# Patient Record
Sex: Female | Born: 1950
Health system: Southern US, Community
[De-identification: ages and names within clinical notes are randomized; demographics above are authoritative.]

## PROBLEM LIST (undated history)

## (undated) DIAGNOSIS — E785 Hyperlipidemia, unspecified: Secondary | ICD-10-CM

## (undated) DIAGNOSIS — I2510 Atherosclerotic heart disease of native coronary artery without angina pectoris: Secondary | ICD-10-CM

## (undated) DIAGNOSIS — M1990 Unspecified osteoarthritis, unspecified site: Secondary | ICD-10-CM

## (undated) DIAGNOSIS — K219 Gastro-esophageal reflux disease without esophagitis: Secondary | ICD-10-CM

## (undated) DIAGNOSIS — I491 Atrial premature depolarization: Secondary | ICD-10-CM

## (undated) DIAGNOSIS — R0600 Dyspnea, unspecified: Secondary | ICD-10-CM

## (undated) DIAGNOSIS — R7303 Prediabetes: Secondary | ICD-10-CM

## (undated) DIAGNOSIS — C801 Malignant (primary) neoplasm, unspecified: Secondary | ICD-10-CM

## (undated) DIAGNOSIS — Z9289 Personal history of other medical treatment: Secondary | ICD-10-CM

## (undated) DIAGNOSIS — E119 Type 2 diabetes mellitus without complications: Secondary | ICD-10-CM

## (undated) DIAGNOSIS — R079 Chest pain, unspecified: Secondary | ICD-10-CM

## (undated) DIAGNOSIS — I209 Angina pectoris, unspecified: Secondary | ICD-10-CM

## (undated) HISTORY — DX: Atrial premature depolarization: I49.1

## (undated) HISTORY — PX: BASAL CELL CARCINOMA EXCISION: SHX1214

## (undated) HISTORY — DX: Chest pain, unspecified: R07.9

## (undated) HISTORY — PX: OTHER SURGICAL HISTORY: SHX169

## (undated) HISTORY — DX: Personal history of other medical treatment: Z92.89

## (undated) HISTORY — DX: Angina pectoris, unspecified: I20.9

## (undated) HISTORY — PX: CARDIAC CATHETERIZATION: SHX172

## (undated) HISTORY — DX: Type 2 diabetes mellitus without complications: E11.9

## (undated) HISTORY — PX: DRUG INDUCED ENDOSCOPY: SHX6808

## (undated) HISTORY — DX: Atherosclerotic heart disease of native coronary artery without angina pectoris: I25.10

## (undated) HISTORY — DX: Gastro-esophageal reflux disease without esophagitis: K21.9

---

## 1988-06-01 DIAGNOSIS — Z9289 Personal history of other medical treatment: Secondary | ICD-10-CM

## 1988-06-01 HISTORY — DX: Personal history of other medical treatment: Z92.89

## 1998-04-01 ENCOUNTER — Other Ambulatory Visit: Admission: RE | Admit: 1998-04-01 | Discharge: 1998-04-01 | Payer: Self-pay | Admitting: Obstetrics & Gynecology

## 1999-06-21 ENCOUNTER — Ambulatory Visit (HOSPITAL_COMMUNITY): Admission: RE | Admit: 1999-06-21 | Discharge: 1999-06-21 | Payer: Self-pay | Admitting: *Deleted

## 1999-06-21 ENCOUNTER — Encounter: Payer: Self-pay | Admitting: Family Medicine

## 2003-08-29 ENCOUNTER — Other Ambulatory Visit: Admission: RE | Admit: 2003-08-29 | Discharge: 2003-08-29 | Payer: Self-pay | Admitting: Obstetrics & Gynecology

## 2005-04-15 ENCOUNTER — Other Ambulatory Visit: Admission: RE | Admit: 2005-04-15 | Discharge: 2005-04-15 | Payer: Self-pay | Admitting: Obstetrics & Gynecology

## 2005-08-18 ENCOUNTER — Ambulatory Visit (HOSPITAL_COMMUNITY): Admission: RE | Admit: 2005-08-18 | Discharge: 2005-08-18 | Payer: Self-pay | Admitting: Obstetrics & Gynecology

## 2005-08-18 ENCOUNTER — Encounter (INDEPENDENT_AMBULATORY_CARE_PROVIDER_SITE_OTHER): Payer: Self-pay | Admitting: *Deleted

## 2006-02-16 ENCOUNTER — Encounter (INDEPENDENT_AMBULATORY_CARE_PROVIDER_SITE_OTHER): Payer: Self-pay | Admitting: *Deleted

## 2006-02-16 ENCOUNTER — Ambulatory Visit (HOSPITAL_COMMUNITY): Admission: RE | Admit: 2006-02-16 | Discharge: 2006-02-16 | Payer: Self-pay | Admitting: *Deleted

## 2006-12-06 ENCOUNTER — Ambulatory Visit (HOSPITAL_COMMUNITY): Admission: RE | Admit: 2006-12-06 | Discharge: 2006-12-06 | Payer: Self-pay | Admitting: Internal Medicine

## 2007-05-24 ENCOUNTER — Ambulatory Visit (HOSPITAL_COMMUNITY): Admission: RE | Admit: 2007-05-24 | Discharge: 2007-05-24 | Payer: Self-pay | Admitting: *Deleted

## 2007-05-24 ENCOUNTER — Encounter (INDEPENDENT_AMBULATORY_CARE_PROVIDER_SITE_OTHER): Payer: Self-pay | Admitting: *Deleted

## 2008-08-22 ENCOUNTER — Ambulatory Visit (HOSPITAL_COMMUNITY): Admission: RE | Admit: 2008-08-22 | Discharge: 2008-08-22 | Payer: Self-pay | Admitting: *Deleted

## 2008-08-22 ENCOUNTER — Encounter (INDEPENDENT_AMBULATORY_CARE_PROVIDER_SITE_OTHER): Payer: Self-pay | Admitting: *Deleted

## 2009-11-20 ENCOUNTER — Emergency Department (HOSPITAL_COMMUNITY)
Admission: EM | Admit: 2009-11-20 | Discharge: 2009-11-20 | Payer: Self-pay | Source: Home / Self Care | Admitting: Emergency Medicine

## 2010-10-14 NOTE — Op Note (Signed)
[name], Christine [name] NO.:  [id number]   MEDICAL RECORD NO.:  [id number]          PATIENT TYPE:  AMB   LOCATION:  ENDO                         FACILITY:  [facility]   PHYSICIAN:  Georgiana Spinner, M.D.    DATE OF BIRTH:  04-17-1951   DATE OF PROCEDURE:  08/22/2008  DATE OF DISCHARGE:                               OPERATIVE REPORT   PROCEDURE:  Upper endoscopy with biopsy.   INDICATIONS:  Barrett's esophagus.   ANESTHESIA:  Fentanyl 50 mcg, Versed 8 mg.   PROCEDURE:  With the patient mildly sedated in the left lateral  decubitus position, the Pentax videoscopic endoscope was inserted in the  mouth and passed under direct vision through the esophagus which  appeared normal.  There were questionable areas of short segment  Barrett's that were photographed and biopsies were taken.  This may have  been a normal variant.  We entered into the stomach.  The fundus, body,  antrum, duodenal bulb, and second portion duodenum were visualized from  this point.  The endoscope was slowly withdrawn taking circumferential  views of duodenal mucosa until the endoscope had been pulled back into  the stomach and placed in retroflexion to view the stomach from below.  The endoscope was straightened and withdrawn taking circumferential  views of remaining gastric and esophageal mucosa.  The patient's vital  signs and pulse oximeter remained stable.  The patient tolerated  procedure well without apparent complications.   FINDINGS:  Question of Barrett's esophagus, biopsied.   Await biopsy report.  The patient will call me for results and follow up  with me as an outpatient.           ______________________________  Georgiana Spinner, M.D.     GMO/MEDQ  D:  08/22/2008  T:  08/22/2008  Job:  [id number]

## 2010-10-14 NOTE — Op Note (Signed)
Christine [name], MARCELLI NO.:  [id number]   MEDICAL RECORD NO.:  [id number]          PATIENT TYPE:  AMB   LOCATION:  ENDO                         FACILITY[facility]   PHYSICIAN:  Georgiana Spinner, M.D.    DATE OF BIRTH:  [birth year]   DATE OF PROCEDURE:  05/24/2007  DATE OF DISCHARGE:                               OPERATIVE REPORT   PROCEDURES:  Upper endoscopy with biopsy.   INDICATIONS:  Gastroesophageal reflux disease with Barrett's esophagus.   ANESTHESIA:  Fentanyl 60 mcg, Versed 5 mg.   PROCEDURE:  With the patient mildly sedated in the left lateral  decubitus position the Pentax videoscopic endoscope was inserted and  passed under direct vision through the esophagus which appeared normal  until we reached distal esophagus and there was findings of Barrett's  photographed and biopsied.  We entered into the stomach.  Fundus, body,  antrum, duodenal bulb, second portion duodenum all appeared normal.  From this point the endoscope was slowly withdrawn taking  circumferential views of duodenal mucosa until the endoscope had been  pulled back into stomach placed in retroflexion to view the stomach from  below.  The endoscope was straightened and withdrawn taking  circumferential views remaining gastric and esophageal mucosa.  The  patient's vital signs, pulse oximeter remained stable.  The patient  tolerated procedure well without apparent complication.   FINDINGS:  Barrett's esophagus.  Await biopsy report.  The patient will  call me for results and follow-up with me as an outpatient.           ______________________________  Georgiana Spinner, M.D.     GMO/MEDQ  D:  05/24/2007  T:  05/24/2007  Job:  [id number]

## 2010-10-17 NOTE — Op Note (Signed)
Christine, [name] NO.:  [id number]   MEDICAL RECORD NO.:  [id number]          PATIENT TYPE:  AMB   LOCATION:  SDC                           FACILITY:  [facility]   PHYSICIAN:  Freddy Finner, M.D.   DATE OF BIRTH:  [birth year]   DATE OF PROCEDURE:  08/18/2005  DATE OF DISCHARGE:                                 OPERATIVE REPORT   PREOPERATIVE DIAGNOSES:  1.  Postmenopausal bleeding.  2.  Endometrial thickening on sonohysterogram.   POSTOPERATIVE DIAGNOSES:  1.  Postmenopausal bleeding.  2.  Endometrial thickening on sonohysterogram.   OPERATIVE PROCEDURE:  Hysteroscopy D&C.   SURGEON:  Freddy Finner, M.D.   ANESTHESIA:  Managed IV sedation with paracervical block. Toradol 30 mg IV,  30 mg IM was given immediately on completion of the procedure.   ESTIMATED INTRAOPERATIVE BLOOD LOSS:  Minimal with less than or equal to 10  mL.   SORBITOL DEFICIT:  80 mL   INTRAOPERATIVE COMPLICATIONS:  None.   The patient is a 60 year old who had an episode of postmenopausal bleeding  and sonohysterogram showing endometrial thickening. She was admitted now for  hysteroscopy D&C. She was admitted on the morning of surgery. She was  brought to the operating room and there placed under adequate IV sedation,  placed in the dorsal lithotomy position using the [place] stirrup system.  Betadine prep was carried out in the usual fashion. Sterile drapes were  applied. A bivalve speculum was introduced and the cervix visualized. It was  then grasped on the anterior lip with a single tooth tenaculum. Paracervical  block was placed using 10 mL of 1% Xylocaine. Injections were made at 4  o'clock and 8 o'clock in the vaginal fornices. The uterus sounded to 7.5 cm.  The cervix was progressively dilated with Pratts to 23. The 12.5-degree [facility]  hysteroscope was introduced using 3% sorbitol as distending medium.  Inspection of the cavity revealed no obvious abnormalities. Gentle thorough  curettage and exploration with Randall stone forceps was performed.  Reinspection confirmed adequate sampling of the endometrium. Photographs  were made before and after sampling. At this point all instruments were  removed. The patient was awakened and taken to  the recovery room in good condition. She has Vicodin to be taken as needed  for postoperative pain with or without ibuprofen. She is to return to the  office in approximately 2 weeks for postoperative follow-up. She will be  given routine outpatient surgical instructions.      Freddy Finner, M.D.  Electronically Signed     WRN/MEDQ  D:  08/18/2005  T:  08/18/2005  Job:  [id number]

## 2010-10-17 NOTE — Op Note (Signed)
NAMECOSIMA, PRENTISS NO.:  [id number]   MEDICAL RECORD NO.:  [id number]          PATIENT TYPE:  AMB   LOCATION:  ENDO                         FACILITY:  [facility]   PHYSICIAN:  Georgiana Spinner, M.D.    DATE OF BIRTH:  Dec 06, 1950   DATE OF PROCEDURE:  DATE OF DISCHARGE:                                 OPERATIVE REPORT   PROCEDURE:  Upper endoscopy.   INDICATIONS:  GERD.   ANESTHESIA:  Fentanyl 50 mcg, Versed 4 mg.   DESCRIPTION OF PROCEDURE:  With the patient mildly sedated in the left  lateral decubitus position, the Olympus videoscopic endoscope was inserted  in the mouth and passed under direct vision through the esophagus which  appeared normal until we reached the distal esophagus and there was a  question of Barrett's photographed and biopsied.  We entered into the  stomach. The fundus, body, antrum, duodenal bulb, and second of portion  duodenum all appeared normal.  From this point, the endoscope was slowly  withdrawn taking circumferential views of the duodenal mucosa until the  endoscope was then pulled back into the stomach, placed in retroflexion to  view the stomach from below. The endoscope was then straightened and  withdrawn taking circumferential views of the remaining gastric and  esophageal mucosa.  The patient's vital signs and pulse oximeter remained  stable.  The patient tolerated the procedure well without apparent  complications.   FINDINGS:  Question of Barrett's esophagus. Await biopsy report.  The  patient will call me for results and follow-up with me as an outpatient.  Proceed to colonoscopy as planned.           ______________________________  Georgiana Spinner, M.D.     GMO/MEDQ  D:  02/16/2006  T:  02/17/2006  Job:  [id number]

## 2010-10-17 NOTE — Op Note (Signed)
NAMEBREELY, PANIK NO.:  [id number]   MEDICAL RECORD NO.:  [id number]          PATIENT TYPE:  AMB   LOCATION:  ENDO                         FACILITY:  [facility]   PHYSICIAN:  Georgiana Spinner, M.D.    DATE OF BIRTH:  [birth year]   DATE OF PROCEDURE:  DATE OF DISCHARGE:                                 OPERATIVE REPORT   PROCEDURE:  Colonoscopy.   INDICATIONS:  Colon cancer screening.   ANESTHESIA:  Fentanyl 50 mcg, Versed 6 mg.   PROCEDURE:  With the patient mildly sedated in the left lateral decubitus  position, the Olympus videoscopic colonoscope was inserted into the rectum  and passed under direct vision to the cecum identified by ileocecal valve  and appendiceal orifice, both of which were photographed.  From this point  colonoscope was slowly withdrawn taking circumferential views of colonic  mucosa stopping in the rectum which appeared normal on direct and showed  hemorrhoids on retroflexed view.  The endoscope was straightened and  withdrawn.  The patient's vital signs, pulse oximeter remained stable.  The  patient tolerated procedure well without apparent complications.   FINDINGS:  Internal hemorrhoids otherwise unremarkable colonoscopic  examination to cecum.   PLAN:  Consider repeat examination in 5-10 years           ______________________________  Georgiana Spinner, M.D.     GMO/MEDQ  D:  02/16/2006  T:  02/17/2006  Job:  [id number]

## 2014-12-10 ENCOUNTER — Other Ambulatory Visit: Payer: Self-pay | Admitting: Obstetrics & Gynecology

## 2014-12-11 LAB — CYTOLOGY - PAP

## 2017-02-25 DIAGNOSIS — Z1231 Encounter for screening mammogram for malignant neoplasm of breast: Secondary | ICD-10-CM | POA: Diagnosis not present

## 2017-03-11 DIAGNOSIS — Z01419 Encounter for gynecological examination (general) (routine) without abnormal findings: Secondary | ICD-10-CM | POA: Diagnosis not present

## 2017-03-11 DIAGNOSIS — Z124 Encounter for screening for malignant neoplasm of cervix: Secondary | ICD-10-CM | POA: Diagnosis not present

## 2017-03-15 ENCOUNTER — Encounter: Payer: Self-pay | Admitting: Gastroenterology

## 2017-03-15 DIAGNOSIS — Z7289 Other problems related to lifestyle: Secondary | ICD-10-CM | POA: Diagnosis not present

## 2017-03-15 DIAGNOSIS — Z0001 Encounter for general adult medical examination with abnormal findings: Secondary | ICD-10-CM | POA: Diagnosis not present

## 2017-03-15 DIAGNOSIS — E7800 Pure hypercholesterolemia, unspecified: Secondary | ICD-10-CM | POA: Diagnosis not present

## 2017-03-15 DIAGNOSIS — Z23 Encounter for immunization: Secondary | ICD-10-CM | POA: Diagnosis not present

## 2017-03-15 DIAGNOSIS — I8393 Asymptomatic varicose veins of bilateral lower extremities: Secondary | ICD-10-CM | POA: Diagnosis not present

## 2017-03-18 DIAGNOSIS — R0609 Other forms of dyspnea: Secondary | ICD-10-CM | POA: Diagnosis not present

## 2017-03-18 DIAGNOSIS — R7303 Prediabetes: Secondary | ICD-10-CM | POA: Diagnosis not present

## 2017-03-18 DIAGNOSIS — Z0189 Encounter for other specified special examinations: Secondary | ICD-10-CM | POA: Diagnosis not present

## 2017-03-18 DIAGNOSIS — I209 Angina pectoris, unspecified: Secondary | ICD-10-CM | POA: Diagnosis not present

## 2017-03-22 DIAGNOSIS — R0602 Shortness of breath: Secondary | ICD-10-CM | POA: Diagnosis not present

## 2017-03-22 DIAGNOSIS — R0789 Other chest pain: Secondary | ICD-10-CM | POA: Diagnosis not present

## 2017-03-26 DIAGNOSIS — I209 Angina pectoris, unspecified: Secondary | ICD-10-CM | POA: Diagnosis not present

## 2017-03-31 DIAGNOSIS — R079 Chest pain, unspecified: Secondary | ICD-10-CM

## 2017-03-31 NOTE — H&P (Signed)
Christine [name] April 13, 2017 1:56 PM Location: [facility] Patient #: [id number] DOB: [birth year] Married / Language: Christine [name] / Race: White Female   History of Present Illness Christine Maine FNP-C; 04-13-17 4:30 PM) Patient words: NP EVAL ASAP for CP, SOB.  The patient is a 66 year old female who presents with chest pain. Patient is referred to [place] by Dr. Shelia [name] for evaluation of exertional chest pain. Patient reports she's had symptoms for the last one year; however, due to insurance difficulties has not been able to be seen for the same. Patient reports she has had chest tightness when doing activities such as climbing stairs or trying to walk labs. She reports pain will radiate to the back of her throat. No associated nausea, but does have associated shortness of breath. Episodes will resolve with rest. She reports she was previously able to walk 15 laps a day; however, is now only able to walk two laps due to her symptoms. She reports she has mild hyperlipidemia that she is controlling with diet, along with prediabetes. Denies any hypertension. No former tobacco use.   Problem List/Past Medical (Christine [name]; 04/13/2017 2:10 PM) Laboratory examination (Z01.89)  GERD (gastroesophageal reflux disease) (K21.9)   Allergies (Christine [name]; 04-13-2017 2:05 PM) No Known Drug Allergies [13-Apr-2017]:  Family History (Christine [name]; 2017/04/13 2:07 PM) Mother  Deceased. at age 98 from [place]; No known Heart conditions Father  Deceased. at age 29 from [place], Melanoma; Had an MI at age 35, hx of CVA's Brother 3  1-Older; 2-Younger-1 had an MI in his 28's  Social History (Christine [name]; 04/13/2017 2:07 PM) Current tobacco use  Never smoker. Non Drinker/No Alcohol Use  Marital status  Married. Number of Children  2. Living Situation  Lives with spouse.  Past Surgical History (Christine [name]; 04/13/17 2:07 PM) None [04/13/17]:  Medication  History (Christine [name]; 04/13/17 2:11 PM) Protonix (20MG  Tablet DR, 1 Oral two times daily, Taken starting 03/17/2017) Active. Medications Reconciled (Verbally)  Diagnostic Studies History (Christine [name]; 04/13/17 2:09 PM) Colonoscopy [2015]: Normal. Endoscopy [2015]: Normal. Sleep Study [2010]: Normal. Neg for Sleep Apnea Treadmill stress test [2010]: Normal. Echocardiogram [1990]: Normal.    Review of Systems Christine Maine FNP-C; 04/13/17 2:28 PM) General Not Present- Appetite Loss and Weight Gain. Respiratory Present- Decreased Exercise Tolerance. Not Present- Chronic Cough and Wakes up from Sleep Wheezing or Short of Breath. Cardiovascular Present- Chest Pain (radiation to the back of the throat) and Difficulty Breathing On Exertion. Not Present- Difficulty Breathing Lying Down and Edema. Gastrointestinal Not Present- Black, Tarry Stool and Difficulty Swallowing. Musculoskeletal Not Present- Decreased Range of Motion and Muscle Atrophy. Neurological Not Present- Attention Deficit. Psychiatric Not Present- Personality Changes and Suicidal Ideation. Endocrine Not Present- Cold Intolerance and Heat Intolerance. Hematology Not Present- Abnormal Bleeding. All other systems negative  Vitals (Christine [name]; 2017-04-13 2:13 PM) 04/13/17 2:00 PM Weight: 148.25 lb Height: 61in Body Surface Area: 1.66 m Body Mass Index: 28.01 kg/m  Pulse: 76 (Regular)  P.OX: 97% (Room air) BP: 118/72 (Sitting, Left Arm, Standard)       Physical Exam Christine Maine FNP-C; Apr 13, 2017 2:46 PM) General Mental Status-Alert. General Appearance-Cooperative and Appears stated age. Build & Nutrition-Moderately built.  Head and Neck Thyroid Gland Characteristics - normal size and consistency and no palpable nodules.  Chest and Lung Exam Chest and lung exam reveals -quiet, even and easy respiratory effort with no use of accessory muscles, non-tender and on  auscultation, normal breath sounds, no  adventitious sounds.  Cardiovascular Cardiovascular examination reveals -carotid auscultation reveals no bruits, abdominal aorta auscultation reveals no bruits and no prominent pulsation, femoral artery auscultation bilaterally reveals normal pulses, no bruits, no thrills, normal pedal pulses bilaterally and no digital clubbing, cyanosis, edema, increased warmth or tenderness. Auscultation Murmurs & Other Heart Sounds - Murmur - Location - Aortic Area. Character - Systolic Ejection.  Abdomen Palpation/Percussion Palpation and Percussion of the abdomen reveal - Non Tender and No hepatosplenomegaly.  Neurologic Neurologic evaluation reveals -alert and oriented x 3 with no impairment of recent or remote memory. Motor-Grossly intact without any focal deficits.  Musculoskeletal Global Assessment Left Lower Extremity - no deformities, masses or tenderness, no known fractures. Right Lower Extremity - no deformities, masses or tenderness, no known fractures.    Assessment & Plan Christine Maine FNP-C; 03/18/2017 4:28 PM) Angina pectoris (I20.9) Story: EKG 03/18/2017: Normal sinus rhyhthm 66 bpm, normal axis. Normal conduction. Current Plans Complete electrocardiogram (93000) Started Metoprolol Succinate ER 25MG , 1 (one) Tablet daily, #30, 30 days starting 03/18/2017, Ref. x1. Started Nitroglycerin 0.4MG , 1 (one) Tablet every 5 minutes as needed for chest pain., #25, 25 days starting 03/18/2017, No Refill. Future Plans 03/22/2017: Myocardial perfusion imaging, tomographic (SPECT) (including attenuation correction, qualitative or quantitative wall motion, ejection fraction by first pass or gated technique, additional quantification, when performed) - one time Prediabetes (R73.03) Dyspnea on exertion (R06.09) Future Plans 04/09/2017: Echocardiography, transthoracic, real-time with image documentation (2D), includes M-mode recording, when performed,  complete, with spectral Doppler echocardiography, and with color flow Doppler echocardiography (68341) - one time Laboratory examination (D62.22) Story: 03/15/2017: CBC normal.  Note:. Recommendations:  Patient is referred to [place] for evaluation of exertional chest pain. I am concerned regarding patient's symptoms that are suggestive of angina pectoris. She has risk factors including prediabetes and by report mild hyperlipidemia. We'll provide prescription for nitroglycerin and advised how to use. I've also started her on metoprolol succinate 25mg  daily for cardiac protection. Advised her to start 81 mg of aspirin daily. We'll set her up for exercise nuclear stress testing as well as echocardiogram for further evaluation. Will obtain recent lipids from PCP for further risk stratification. We'll see her back after testing for further recommendations and evaluation.  *I have discussed this case with Dr. Virgina Jock and he personally examined the patient and participated in formulating the plan.*  CC: Dr. Deland Pretty    Signed by Christine Maine, FNP-C (03/18/2017 4:32 PM)  Assessment & Plan Joya Gaskins Elia Nunley MD; 03/23/2017 2:37 PM)  Note:Called the patient with nuclear stress test results. There is anterior breast attenuation but no signifciant ischemia/onfarct. Patient continues to have significant retrosternal chest pain with minimal activity that is relieved with rest. Given her risk factors of hyperlipidemia, prediabetes, family history of CAD, my suspicion persists for coronary artery disease in spite of a relatively normal stress test. I discussed options of invasive coronary angiography with possible angioplasty versus coronary artery CT angiogram. She would like to proceed with coronary angiogram with intervention of angioplasty, if indicated. I have asked her to increase metoprolol to 25 mg daily. She is not using nitroglycerin as the pain usually resolves with rest. We'll schedule  coronary angiogram in the coming week or two.  Signed electronically by Vernell Leep, MD (03/23/2017 2:37 PM)

## 2017-03-31 NOTE — Progress Notes (Signed)
Labs 03/26/2017: BUN/Cr 13/0.77  eGFR 81  Na 138, K 4.2 H/H 13.1/38.9. MCV 86. Platelets 283 INR 1.0

## 2017-04-01 ENCOUNTER — Ambulatory Visit (HOSPITAL_COMMUNITY)
Admission: RE | Admit: 2017-04-01 | Discharge: 2017-04-01 | Disposition: A | Discharge status: Home / Self Care | Payer: Medicare Other | Source: Ambulatory Visit | Attending: Cardiology | Admitting: Cardiology

## 2017-04-01 ENCOUNTER — Encounter (HOSPITAL_COMMUNITY): Payer: Self-pay | Admitting: Cardiology

## 2017-04-01 ENCOUNTER — Encounter (HOSPITAL_COMMUNITY)
Admission: RE | Disposition: A | Discharge status: Home / Self Care | Payer: Self-pay | Source: Ambulatory Visit | Attending: Cardiology

## 2017-04-01 DIAGNOSIS — R079 Chest pain, unspecified: Secondary | ICD-10-CM

## 2017-04-01 DIAGNOSIS — R7303 Prediabetes: Secondary | ICD-10-CM | POA: Insufficient documentation

## 2017-04-01 DIAGNOSIS — K219 Gastro-esophageal reflux disease without esophagitis: Secondary | ICD-10-CM | POA: Insufficient documentation

## 2017-04-01 DIAGNOSIS — I2584 Coronary atherosclerosis due to calcified coronary lesion: Secondary | ICD-10-CM | POA: Diagnosis not present

## 2017-04-01 DIAGNOSIS — E785 Hyperlipidemia, unspecified: Secondary | ICD-10-CM | POA: Insufficient documentation

## 2017-04-01 DIAGNOSIS — I25119 Atherosclerotic heart disease of native coronary artery with unspecified angina pectoris: Secondary | ICD-10-CM | POA: Diagnosis not present

## 2017-04-01 DIAGNOSIS — Z8249 Family history of ischemic heart disease and other diseases of the circulatory system: Secondary | ICD-10-CM | POA: Diagnosis not present

## 2017-04-01 DIAGNOSIS — I10 Essential (primary) hypertension: Secondary | ICD-10-CM | POA: Diagnosis not present

## 2017-04-01 DIAGNOSIS — I2510 Atherosclerotic heart disease of native coronary artery without angina pectoris: Secondary | ICD-10-CM | POA: Diagnosis present

## 2017-04-01 DIAGNOSIS — I25110 Atherosclerotic heart disease of native coronary artery with unstable angina pectoris: Secondary | ICD-10-CM | POA: Diagnosis not present

## 2017-04-01 HISTORY — PX: LEFT HEART CATH AND CORONARY ANGIOGRAPHY: CATH118249

## 2017-04-01 SURGERY — LEFT HEART CATH AND CORONARY ANGIOGRAPHY
Anesthesia: LOCAL

## 2017-04-01 MED ORDER — HEPARIN (PORCINE) IN NACL 2-0.9 UNIT/ML-% IJ SOLN
INTRAMUSCULAR | Status: AC
  Filled 2017-04-01: qty 1000

## 2017-04-01 MED ORDER — VERAPAMIL HCL 2.5 MG/ML IV SOLN
INTRAVENOUS | Status: AC
  Filled 2017-04-01: qty 2

## 2017-04-01 MED ORDER — ISOSORBIDE MONONITRATE ER 30 MG PO TB24
30.0000 mg | ORAL_TABLET | Freq: Every day | ORAL | Status: DC
  Administered 2017-04-01: 30 mg via ORAL
  Filled 2017-04-01: qty 1

## 2017-04-01 MED ORDER — ASPIRIN 81 MG PO CHEW
CHEWABLE_TABLET | ORAL | Status: AC
  Administered 2017-04-01: 81 mg via ORAL
  Filled 2017-04-01: qty 1

## 2017-04-01 MED ORDER — ACETAMINOPHEN 325 MG PO TABS: 650.0000 mg | ORAL_TABLET | ORAL | Status: DC | PRN

## 2017-04-01 MED ORDER — HEPARIN SODIUM (PORCINE) 1000 UNIT/ML IJ SOLN
INTRAMUSCULAR | Status: DC | PRN
  Administered 2017-04-01: 4000 [IU] via INTRAVENOUS

## 2017-04-01 MED ORDER — SODIUM CHLORIDE 0.9 % IV SOLN
250.0000 mL | INTRAVENOUS | Status: DC | PRN
Start: 2017-04-01 — End: 2017-04-01

## 2017-04-01 MED ORDER — ISOSORBIDE MONONITRATE ER 30 MG PO TB24: 30.0000 mg | ORAL_TABLET | Freq: Every day | ORAL | 3 refills | Status: DC

## 2017-04-01 MED ORDER — ONDANSETRON HCL 4 MG/2ML IJ SOLN: 4.0000 mg | Freq: Four times a day (QID) | INTRAMUSCULAR | Status: DC | PRN

## 2017-04-01 MED ORDER — SODIUM CHLORIDE 0.9% FLUSH: 3.0000 mL | INTRAVENOUS | Status: DC | PRN

## 2017-04-01 MED ORDER — SODIUM CHLORIDE 0.9% FLUSH: 3.0000 mL | Freq: Two times a day (BID) | INTRAVENOUS | Status: DC

## 2017-04-01 MED ORDER — LIDOCAINE HCL (PF) 1 % IJ SOLN
INTRAMUSCULAR | Status: AC
  Filled 2017-04-01: qty 30

## 2017-04-01 MED ORDER — ROSUVASTATIN CALCIUM 40 MG PO TABS: 40.0000 mg | ORAL_TABLET | Freq: Every day | ORAL | 3 refills | Status: DC

## 2017-04-01 MED ORDER — ROSUVASTATIN CALCIUM 40 MG PO TABS: 40.0000 mg | ORAL_TABLET | Freq: Every day | ORAL | Status: DC

## 2017-04-01 MED ORDER — LIDOCAINE HCL 2 % IJ SOLN
INTRAMUSCULAR | Status: DC | PRN
  Administered 2017-04-01: 1 mL

## 2017-04-01 MED ORDER — HEPARIN (PORCINE) IN NACL 2-0.9 UNIT/ML-% IJ SOLN
INTRAMUSCULAR | Status: AC | PRN
  Administered 2017-04-01: 1000 mL

## 2017-04-01 MED ORDER — METOPROLOL SUCCINATE ER 25 MG PO TB24: 25.0000 mg | ORAL_TABLET | Freq: Every day | ORAL | 3 refills | Status: DC

## 2017-04-01 MED ORDER — MIDAZOLAM HCL 2 MG/2ML IJ SOLN
INTRAMUSCULAR | Status: AC
  Filled 2017-04-01: qty 2

## 2017-04-01 MED ORDER — MIDAZOLAM HCL 2 MG/2ML IJ SOLN
INTRAMUSCULAR | Status: DC | PRN
  Administered 2017-04-01 (×2): 1 mg via INTRAVENOUS

## 2017-04-01 MED ORDER — SODIUM CHLORIDE 0.9 % IV SOLN
INTRAVENOUS | Status: DC
  Administered 2017-04-01: 06:00:00 via INTRAVENOUS

## 2017-04-01 MED ORDER — VERAPAMIL HCL 2.5 MG/ML IV SOLN
INTRAVENOUS | Status: DC | PRN
  Administered 2017-04-01: 5 mL via INTRA_ARTERIAL

## 2017-04-01 MED ORDER — ASPIRIN 81 MG PO CHEW
81.0000 mg | CHEWABLE_TABLET | ORAL | Status: AC
  Administered 2017-04-01: 81 mg via ORAL

## 2017-04-01 MED ORDER — SODIUM CHLORIDE 0.9 % IV SOLN: 250.0000 mL | INTRAVENOUS | Status: DC | PRN

## 2017-04-01 MED ORDER — FENTANYL CITRATE (PF) 100 MCG/2ML IJ SOLN
INTRAMUSCULAR | Status: DC | PRN
  Administered 2017-04-01 (×2): 25 ug via INTRAVENOUS

## 2017-04-01 MED ORDER — SODIUM CHLORIDE 0.9 % IV SOLN: INTRAVENOUS | Status: AC

## 2017-04-01 MED ORDER — IOPAMIDOL (ISOVUE-370) INJECTION 76%
INTRAVENOUS | Status: DC | PRN
Start: 2017-04-01 — End: 2017-04-01
  Administered 2017-04-01: 50 mL via INTRA_ARTERIAL

## 2017-04-01 MED ORDER — FENTANYL CITRATE (PF) 100 MCG/2ML IJ SOLN
INTRAMUSCULAR | Status: AC
  Filled 2017-04-01: qty 2

## 2017-04-01 MED ORDER — HEPARIN SODIUM (PORCINE) 1000 UNIT/ML IJ SOLN
INTRAMUSCULAR | Status: AC
  Filled 2017-04-01: qty 1

## 2017-04-01 SURGICAL SUPPLY — 10 items
CATH OPTITORQUE TIG 4.0 5F (CATHETERS) ×1 IMPLANT
DEVICE RAD COMP TR BAND LRG (VASCULAR PRODUCTS) ×1 IMPLANT
GLIDESHEATH SLEND A-KIT 6F 22G (SHEATH) ×1 IMPLANT
GUIDEWIRE INQWIRE 1.5J.035X260 (WIRE) IMPLANT
INQWIRE 1.5J .035X260CM (WIRE) ×2
KIT HEART LEFT (KITS) ×2 IMPLANT
PACK CARDIAC CATHETERIZATION (CUSTOM PROCEDURE TRAY) ×2 IMPLANT
TRANSDUCER W/STOPCOCK (MISCELLANEOUS) ×2 IMPLANT
TUBING CIL FLEX 10 FLL-RA (TUBING) ×2 IMPLANT
WIRE HI TORQ VERSACORE-J 145CM (WIRE) ×1 IMPLANT

## 2017-04-01 NOTE — Discharge Instructions (Signed)

## 2017-04-01 NOTE — Interval H&P Note (Signed)
History and Physical Interval Note:  04/01/2017 7:26 AM  Christine [name]  has presented today for surgery, with the diagnosis of Positive Stress Test, CP  The various methods of treatment have been discussed with the patient and family. After consideration of risks, benefits and other options for treatment, the patient has consented to  Procedure(s): LEFT HEART CATH AND CORONARY ANGIOGRAPHY (N/A) as a surgical intervention .  The patient's history has been reviewed, patient examined, no change in status, stable for surgery.  I have reviewed the patient's chart and labs.  Questions were answered to the patient's satisfaction.    CAD Assessment (Coronary Angiography With or Without Left Heart Catheterization and/or Left Ventriculography) Link Here: [email or web address] Indication:  1. Suspected CAD (No Prior PCI, No Prior CABG, and No Prior Angiogram Showing >= 50% Angiographic Stenosis) 2. Prior Noninvasive Testing: Stress Test With Imaging (SPECT MPI, Stress Echocardiography, Stress PET, Stress CMR) 3. Low-risk findings (e.g., 5% ischemic myocardium on stress SPECT MPI or stress PET, no stress-induced wall motion abnormalities on stress echo or stress CMR) 4. Pretest Symptom Status: Symptomatic U (4) Indication: 15; Score 4 4 Indication:  1. Suspected CAD (No Prior PCI, No Prior CABG, and No Prior Angiogram Showing >= 50% Angiographic Stenosis) 2. Prior Noninvasive Testing: Stress Test With Imaging (SPECT MPI, Stress Echocardiography, Stress PET, Stress CMR) 3. Discordant findings (e.g., low-risk prior imaging with ongoing symptoms consistent with ischemic equivalent) 4. Pretest Symptom Status: Symptomatic 5.  A (7) Indication: 19; Score 7      [place]

## 2017-04-05 ENCOUNTER — Encounter: Payer: Self-pay | Admitting: *Deleted

## 2017-04-05 DIAGNOSIS — I209 Angina pectoris, unspecified: Secondary | ICD-10-CM | POA: Insufficient documentation

## 2017-04-05 DIAGNOSIS — K219 Gastro-esophageal reflux disease without esophagitis: Secondary | ICD-10-CM | POA: Insufficient documentation

## 2017-04-05 DIAGNOSIS — I2510 Atherosclerotic heart disease of native coronary artery without angina pectoris: Secondary | ICD-10-CM | POA: Insufficient documentation

## 2017-04-05 DIAGNOSIS — I200 Unstable angina: Secondary | ICD-10-CM | POA: Insufficient documentation

## 2017-04-06 ENCOUNTER — Encounter: Payer: Self-pay | Admitting: Thoracic Surgery (Cardiothoracic Vascular Surgery)

## 2017-04-06 ENCOUNTER — Other Ambulatory Visit: Payer: Self-pay

## 2017-04-06 ENCOUNTER — Institutional Professional Consult (permissible substitution): Payer: Medicare Other | Admitting: Thoracic Surgery (Cardiothoracic Vascular Surgery)

## 2017-04-06 VITALS — BP 144/68 | HR 73 | Resp 16 | Ht 62.0 in | Wt 140.0 lb

## 2017-04-06 DIAGNOSIS — I2510 Atherosclerotic heart disease of native coronary artery without angina pectoris: Secondary | ICD-10-CM

## 2017-04-06 DIAGNOSIS — I25118 Atherosclerotic heart disease of native coronary artery with other forms of angina pectoris: Secondary | ICD-10-CM | POA: Diagnosis not present

## 2017-04-06 NOTE — Progress Notes (Signed)
PCP is Deland Pretty, MD Referring Provider is Nigel Mormon, MD  Chief Complaint  Patient presents with  . Coronary Artery Disease    referred by [facility] Cardiovascular...CATH 04/01/17, ECHO scheduled for 04/10/15  . Chest Pain    HPI: Mrs Christine [name] is sent for consultation regarding coronary artery disease.  [name] is a 66 year old woman with a past medical history significant for gastroesophageal reflux.  She has no known history of hypertension or hyperlipidemia.  She says she has been told she was prediabetic.  She is a lifelong non-smoker.  The past 6 months she has been having exertional chest discomfort.  She says the pain is mostly in the back of her throat and it feels like somebody has punched her there.  It is only occurred with exertion she has not had any rest or nocturnal symptoms.  She has noted that has progressively worsened and become more frequent with less exertion.  She has a prescription for nitroglycerin but has not had to use it as the pain is always resolved with rest.  She saw Dr. Shelia Media and was referred to Dr. Virgina Jock.  A nuclear stress test showed some breast attenuation but no significant ischemic changes.  As her symptoms were progressing was recommended she undergo cardiac catheterization.  She had cardiac catheterization on 04/01/2017.  It showed a left dominant circulation.  There was significant ostial LAD and circumflex disease and diffuse disease beyond that.  Past Medical History:  Diagnosis Date  . Angina pectoris ([place])   . CAD, multiple vessel   . Chest pain ON EXERTION   . GERD (gastroesophageal reflux disease)   . H/O echocardiogram 1990   NORMAL    History reviewed. No pertinent surgical history.  Family History  Problem Relation Age of Onset  . Cancer Mother        LUNG  . Cancer Father        COLON, MELANOMA  . Heart disease Father 71       MI   . Stroke Father   . Heart disease Brother 87       MI    Social  History Social History   Tobacco Use  . Smoking status: Never Smoker  . Smokeless tobacco: Never Used  Substance Use Topics  . Alcohol use: No    Frequency: Never  . Drug use: Not on file    Current Outpatient Medications  Medication Sig Dispense Refill  . aspirin EC 81 MG tablet Take 81 mg daily by mouth.    . isosorbide mononitrate (IMDUR) 30 MG 24 hr tablet Take 1 tablet (30 mg total) by mouth daily. 60 tablet 3  . metoprolol succinate (TOPROL-XL) 25 MG 24 hr tablet Take 1 tablet (25 mg total) by mouth daily. 60 tablet 3  . nitroGLYCERIN (NITROSTAT) 0.4 MG SL tablet Place 0.4 mg every 5 (five) minutes as needed under the tongue for chest pain. X 3 IF PAIN PERSISTS, CALL 911.    . pantoprazole (PROTONIX) 20 MG tablet Take 20 mg 2 (two) times daily by mouth.    . rosuvastatin (CRESTOR) 40 MG tablet Take 1 tablet (40 mg total) by mouth daily at 6 PM. 60 tablet 3   No current facility-administered medications for this visit.     No Known Allergies  Review of Systems  Constitutional: Positive for activity change and fatigue. Negative for appetite change and unexpected weight change.  HENT: Negative for trouble swallowing and voice change.   Respiratory: Positive  for shortness of breath. Negative for cough and wheezing.   Cardiovascular: Positive for chest pain. Negative for palpitations and leg swelling.  Gastrointestinal: Positive for abdominal pain (Reflux). Negative for abdominal distention.  Endocrine: Negative for polydipsia and polyphagia.  Genitourinary: Negative for dysuria.  Musculoskeletal: Negative for arthralgias and myalgias.  Neurological: Negative for seizures, syncope and weakness.  Hematological: Negative for adenopathy. Does not bruise/bleed easily.  All other systems reviewed and are negative.   BP (!) 144/68 (BP Location: Right Arm, Patient Position: Sitting, Cuff Size: Large)   Pulse 73   Resp 16   Ht 5\' 2"  (1.575 m)   Wt 140 lb (63.5 kg)   SpO2 98%  Comment: ON RA  BMI 25.61 kg/m  Physical Exam  Constitutional: She is oriented to person, place, and time. She appears well-developed and well-nourished. No distress.  HENT:  Head: Normocephalic and atraumatic.  Mouth/Throat: No oropharyngeal exudate.  Eyes: Conjunctivae and EOM are normal. Pupils are equal, round, and reactive to light. No scleral icterus.  Neck: Neck supple. No thyromegaly present.  Cardiovascular: Normal rate and regular rhythm.  Murmur (2/6 systolic murmur) heard. Pulmonary/Chest: Effort normal and breath sounds normal. No respiratory distress. She has no wheezes. She has no rales.  Abdominal: Soft. She exhibits no distension. There is no tenderness.  Musculoskeletal: She exhibits no edema or deformity.  Lymphadenopathy:    She has no cervical adenopathy.  Neurological: She is alert and oriented to person, place, and time. No cranial nerve deficit.  Motor grossly intact, gait steady  Skin: Skin is warm and dry.  Vitals reviewed.    Diagnostic Tests: Left Main  LM lesion 40% stenosed  LM lesion.  Left Anterior Descending  Prox LAD to Mid LAD lesion 99% stenosed  The lesion is calcified. Subtotal occlusion of prox LAD (15 mm) with reconstiturion in mid LAD  Mid LAD lesion 70% stenosed  The lesion is ulcerative.  Second Diagonal Branch  Vessel is small in size.  Ost 2nd Diag lesion 70% stenosed  Ost 2nd Diag lesion.  2nd Diag lesion 70% stenosed  2nd Diag lesion.  Left Circumflex  Ost Cx to Prox Cx lesion 80% stenosed  Hazy 80% stenosis  Dist Cx lesion 60% stenosed  Dist Cx lesion.  Second Obtuse Marginal Branch  2nd Mrg lesion 60% stenosed  2nd Mrg lesion.  Right Coronary Artery  Mid RCA lesion 80% stenosed  Mid RCA lesion.    I personally reviewed the catheterization images and concur with the findings noted above  Impression: [name] is a 66 year old woman with relatively minimal cardiac risk factors who presents with classic exertional  angina.  This started about 6 months ago and now has become progressively worse to the point where she is having it with even light activity.  Catheterization she has severe three-vessel coronary disease with a left dominant circulation.  Coronary artery bypass grafting is indicated for survival benefit as well as relief of symptoms.  I discussed with the general nature of the procedure, the need for general anesthesia, the use of cardiopulmonary bypass, and the incisions to be used with [name] and her friend.  Her husband was not able to be here today.  He had bypass surgery about 5 years ago.. We discussed the expected hospital stay, overall recovery and short and long term outcomes.  I informed her of the indications, risks, benefits, and alternatives.  She understands the risks include, but are not limited to death, stroke, MI, DVT/PE,  bleeding, possible need for transfusion, infections, cardiac arrhythmias, as well as other organ system dysfunction including respiratory, renal, or GI complications.   She accepts the risks and agrees to proceed.  Plan: Coronary artery bypass grafting on Friday, 04/09/2017  Melrose Nakayama, MD [facility] [phone]

## 2017-04-06 NOTE — H&P (View-Only) (Signed)
PCP is Deland Pretty, MD Referring Provider is Nigel Mormon, MD  Chief Complaint  Patient presents with  . Coronary Artery Disease    referred by [facility] Cardiovascular...CATH 04/01/17, ECHO scheduled for 04/10/15  . Chest Pain    HPI: Mrs Pangallo is sent for consultation regarding coronary artery disease.  Mrs. Vonseggern is a 66 year old woman with a past medical history significant for gastroesophageal reflux.  She has no known history of hypertension or hyperlipidemia.  She says she has been told she was prediabetic.  She is a lifelong non-smoker.  The past 6 months she has been having exertional chest discomfort.  She says the pain is mostly in the back of her throat and it feels like somebody has punched her there.  It is only occurred with exertion she has not had any rest or nocturnal symptoms.  She has noted that has progressively worsened and become more frequent with less exertion.  She has a prescription for nitroglycerin but has not had to use it as the pain is always resolved with rest.  She saw Dr. Shelia Media and was referred to Dr. Virgina Jock.  A nuclear stress test showed some breast attenuation but no significant ischemic changes.  As her symptoms were progressing was recommended she undergo cardiac catheterization.  She had cardiac catheterization on 04/01/2017.  It showed a left dominant circulation.  There was significant ostial LAD and circumflex disease and diffuse disease beyond that.  Past Medical History:  Diagnosis Date  . Angina pectoris ([place])   . CAD, multiple vessel   . Chest pain ON EXERTION   . GERD (gastroesophageal reflux disease)   . H/O echocardiogram 1990   NORMAL    History reviewed. No pertinent surgical history.  Family History  Problem Relation Age of Onset  . Cancer Mother        LUNG  . Cancer Father        COLON, MELANOMA  . Heart disease Father 51       MI   . Stroke Father   . Heart disease Brother 64       MI    Social  History Social History   Tobacco Use  . Smoking status: Never Smoker  . Smokeless tobacco: Never Used  Substance Use Topics  . Alcohol use: No    Frequency: Never  . Drug use: Not on file    Current Outpatient Medications  Medication Sig Dispense Refill  . aspirin EC 81 MG tablet Take 81 mg daily by mouth.    . isosorbide mononitrate (IMDUR) 30 MG 24 hr tablet Take 1 tablet (30 mg total) by mouth daily. 60 tablet 3  . metoprolol succinate (TOPROL-XL) 25 MG 24 hr tablet Take 1 tablet (25 mg total) by mouth daily. 60 tablet 3  . nitroGLYCERIN (NITROSTAT) 0.4 MG SL tablet Place 0.4 mg every 5 (five) minutes as needed under the tongue for chest pain. X 3 IF PAIN PERSISTS, CALL 911.    . pantoprazole (PROTONIX) 20 MG tablet Take 20 mg 2 (two) times daily by mouth.    . rosuvastatin (CRESTOR) 40 MG tablet Take 1 tablet (40 mg total) by mouth daily at 6 PM. 60 tablet 3   No current facility-administered medications for this visit.     No Known Allergies  Review of Systems  Constitutional: Positive for activity change and fatigue. Negative for appetite change and unexpected weight change.  HENT: Negative for trouble swallowing and voice change.   Respiratory: Positive  for shortness of breath. Negative for cough and wheezing.   Cardiovascular: Positive for chest pain. Negative for palpitations and leg swelling.  Gastrointestinal: Positive for abdominal pain (Reflux). Negative for abdominal distention.  Endocrine: Negative for polydipsia and polyphagia.  Genitourinary: Negative for dysuria.  Musculoskeletal: Negative for arthralgias and myalgias.  Neurological: Negative for seizures, syncope and weakness.  Hematological: Negative for adenopathy. Does not bruise/bleed easily.  All other systems reviewed and are negative.   BP (!) 144/68 (BP Location: Right Arm, Patient Position: Sitting, Cuff Size: Large)   Pulse 73   Resp 16   Ht 5\' 2"  (1.575 m)   Wt 140 lb (63.5 kg)   SpO2 98%  Comment: ON RA  BMI 25.61 kg/m  Physical Exam  Constitutional: She is oriented to person, place, and time. She appears well-developed and well-nourished. No distress.  HENT:  Head: Normocephalic and atraumatic.  Mouth/Throat: No oropharyngeal exudate.  Eyes: Conjunctivae and EOM are normal. Pupils are equal, round, and reactive to light. No scleral icterus.  Neck: Neck supple. No thyromegaly present.  Cardiovascular: Normal rate and regular rhythm.  Murmur (2/6 systolic murmur) heard. Pulmonary/Chest: Effort normal and breath sounds normal. No respiratory distress. She has no wheezes. She has no rales.  Abdominal: Soft. She exhibits no distension. There is no tenderness.  Musculoskeletal: She exhibits no edema or deformity.  Lymphadenopathy:    She has no cervical adenopathy.  Neurological: She is alert and oriented to person, place, and time. No cranial nerve deficit.  Motor grossly intact, gait steady  Skin: Skin is warm and dry.  Vitals reviewed.    Diagnostic Tests: Left Main  LM lesion 40% stenosed  LM lesion.  Left Anterior Descending  Prox LAD to Mid LAD lesion 99% stenosed  The lesion is calcified. Subtotal occlusion of prox LAD (15 mm) with reconstiturion in mid LAD  Mid LAD lesion 70% stenosed  The lesion is ulcerative.  Second Diagonal Branch  Vessel is small in size.  Ost 2nd Diag lesion 70% stenosed  Ost 2nd Diag lesion.  2nd Diag lesion 70% stenosed  2nd Diag lesion.  Left Circumflex  Ost Cx to Prox Cx lesion 80% stenosed  Hazy 80% stenosis  Dist Cx lesion 60% stenosed  Dist Cx lesion.  Second Obtuse Marginal Branch  2nd Mrg lesion 60% stenosed  2nd Mrg lesion.  Right Coronary Artery  Mid RCA lesion 80% stenosed  Mid RCA lesion.    I personally reviewed the catheterization images and concur with the findings noted above  Impression: Mrs. Bosak is a 66 year old woman with relatively minimal cardiac risk factors who presents with classic exertional  angina.  This started about 6 months ago and now has become progressively worse to the point where she is having it with even light activity.  Catheterization she has severe three-vessel coronary disease with a left dominant circulation.  Coronary artery bypass grafting is indicated for survival benefit as well as relief of symptoms.  I discussed with the general nature of the procedure, the need for general anesthesia, the use of cardiopulmonary bypass, and the incisions to be used with Mrs. Teall and her friend.  Her husband was not able to be here today.  He had bypass surgery about 5 years ago.. We discussed the expected hospital stay, overall recovery and short and long term outcomes.  I informed her of the indications, risks, benefits, and alternatives.  She understands the risks include, but are not limited to death, stroke, MI, DVT/PE,  bleeding, possible need for transfusion, infections, cardiac arrhythmias, as well as other organ system dysfunction including respiratory, renal, or GI complications.   She accepts the risks and agrees to proceed.  Plan: Coronary artery bypass grafting on Friday, 04/09/2017  Melrose Nakayama, MD [facility] [phone]

## 2017-04-07 ENCOUNTER — Ambulatory Visit (HOSPITAL_BASED_OUTPATIENT_CLINIC_OR_DEPARTMENT_OTHER)
Admission: RE | Admit: 2017-04-07 | Discharge: 2017-04-07 | Disposition: A | Discharge status: Home / Self Care | Payer: Medicare Other | Source: Ambulatory Visit | Attending: Thoracic Surgery (Cardiothoracic Vascular Surgery) | Admitting: Thoracic Surgery (Cardiothoracic Vascular Surgery)

## 2017-04-07 ENCOUNTER — Other Ambulatory Visit: Payer: Self-pay

## 2017-04-07 ENCOUNTER — Ambulatory Visit (HOSPITAL_COMMUNITY)
Admission: RE | Admit: 2017-04-07 | Discharge: 2017-04-07 | Disposition: A | Discharge status: Home / Self Care | Payer: Medicare Other | Source: Ambulatory Visit | Attending: Thoracic Surgery (Cardiothoracic Vascular Surgery) | Admitting: Thoracic Surgery (Cardiothoracic Vascular Surgery)

## 2017-04-07 ENCOUNTER — Encounter (HOSPITAL_COMMUNITY): Payer: Self-pay | Admitting: *Deleted

## 2017-04-07 ENCOUNTER — Encounter (HOSPITAL_COMMUNITY)
Admission: RE | Admit: 2017-04-07 | Discharge: 2017-04-07 | Disposition: A | Discharge status: Home / Self Care | Payer: Medicare Other | Source: Ambulatory Visit | Attending: Thoracic Surgery (Cardiothoracic Vascular Surgery) | Admitting: Thoracic Surgery (Cardiothoracic Vascular Surgery)

## 2017-04-07 DIAGNOSIS — M810 Age-related osteoporosis without current pathological fracture: Secondary | ICD-10-CM | POA: Diagnosis not present

## 2017-04-07 DIAGNOSIS — I2510 Atherosclerotic heart disease of native coronary artery without angina pectoris: Secondary | ICD-10-CM | POA: Diagnosis not present

## 2017-04-07 DIAGNOSIS — Z7982 Long term (current) use of aspirin: Secondary | ICD-10-CM | POA: Diagnosis not present

## 2017-04-07 DIAGNOSIS — Z01818 Encounter for other preprocedural examination: Secondary | ICD-10-CM

## 2017-04-07 DIAGNOSIS — R079 Chest pain, unspecified: Secondary | ICD-10-CM | POA: Diagnosis not present

## 2017-04-07 DIAGNOSIS — Z8249 Family history of ischemic heart disease and other diseases of the circulatory system: Secondary | ICD-10-CM | POA: Diagnosis not present

## 2017-04-07 DIAGNOSIS — I6523 Occlusion and stenosis of bilateral carotid arteries: Secondary | ICD-10-CM

## 2017-04-07 DIAGNOSIS — E8770 Fluid overload, unspecified: Secondary | ICD-10-CM | POA: Diagnosis not present

## 2017-04-07 DIAGNOSIS — Z808 Family history of malignant neoplasm of other organs or systems: Secondary | ICD-10-CM | POA: Diagnosis not present

## 2017-04-07 DIAGNOSIS — K219 Gastro-esophageal reflux disease without esophagitis: Secondary | ICD-10-CM | POA: Diagnosis not present

## 2017-04-07 DIAGNOSIS — Z79899 Other long term (current) drug therapy: Secondary | ICD-10-CM | POA: Diagnosis not present

## 2017-04-07 DIAGNOSIS — Z85828 Personal history of other malignant neoplasm of skin: Secondary | ICD-10-CM | POA: Diagnosis not present

## 2017-04-07 DIAGNOSIS — R0602 Shortness of breath: Secondary | ICD-10-CM | POA: Diagnosis not present

## 2017-04-07 DIAGNOSIS — Z823 Family history of stroke: Secondary | ICD-10-CM | POA: Diagnosis not present

## 2017-04-07 DIAGNOSIS — E119 Type 2 diabetes mellitus without complications: Secondary | ICD-10-CM | POA: Diagnosis not present

## 2017-04-07 DIAGNOSIS — I25119 Atherosclerotic heart disease of native coronary artery with unspecified angina pectoris: Secondary | ICD-10-CM | POA: Diagnosis not present

## 2017-04-07 DIAGNOSIS — D62 Acute posthemorrhagic anemia: Secondary | ICD-10-CM | POA: Diagnosis not present

## 2017-04-07 DIAGNOSIS — Z800 Family history of malignant neoplasm of digestive organs: Secondary | ICD-10-CM | POA: Diagnosis not present

## 2017-04-07 DIAGNOSIS — I4891 Unspecified atrial fibrillation: Secondary | ICD-10-CM | POA: Diagnosis not present

## 2017-04-07 DIAGNOSIS — J9811 Atelectasis: Secondary | ICD-10-CM | POA: Diagnosis not present

## 2017-04-07 HISTORY — DX: Malignant (primary) neoplasm, unspecified: C80.1

## 2017-04-07 HISTORY — DX: Prediabetes: R73.03

## 2017-04-07 HISTORY — DX: Dyspnea, unspecified: R06.00

## 2017-04-07 HISTORY — DX: Unspecified osteoarthritis, unspecified site: M19.90

## 2017-04-07 LAB — URINALYSIS, ROUTINE W REFLEX MICROSCOPIC
BILIRUBIN URINE: NEGATIVE
GLUCOSE, UA: 150 mg/dL — AB
Hgb urine dipstick: NEGATIVE
KETONES UR: NEGATIVE mg/dL
LEUKOCYTES UA: NEGATIVE
Nitrite: NEGATIVE
PROTEIN: NEGATIVE mg/dL
Specific Gravity, Urine: 1.014 (ref 1.005–1.030)
pH: 6 (ref 5.0–8.0)

## 2017-04-07 LAB — BLOOD GAS, ARTERIAL
Acid-Base Excess: 0.4 mmol/L (ref 0.0–2.0)
Bicarbonate: 23.8 mmol/L (ref 20.0–28.0)
Drawn by: 470591
FIO2: 21
O2 Saturation: 82.4 %
PATIENT TEMPERATURE: 98.6
pCO2 arterial: 33.7 mmHg (ref 32.0–48.0)
pH, Arterial: 7.464 — ABNORMAL HIGH (ref 7.350–7.450)
pO2, Arterial: 44.2 mmHg — ABNORMAL LOW (ref 83.0–108.0)

## 2017-04-07 LAB — CBC
HEMATOCRIT: 40.1 % (ref 36.0–46.0)
Hemoglobin: 13.2 g/dL (ref 12.0–15.0)
MCH: 28.6 pg (ref 26.0–34.0)
MCHC: 32.9 g/dL (ref 30.0–36.0)
MCV: 86.8 fL (ref 78.0–100.0)
Platelets: 311 10*3/uL (ref 150–400)
RBC: 4.62 MIL/uL (ref 3.87–5.11)
RDW: 13.1 % (ref 11.5–15.5)
WBC: 9.6 10*3/uL (ref 4.0–10.5)

## 2017-04-07 LAB — COMPREHENSIVE METABOLIC PANEL
ALBUMIN: 3.9 g/dL (ref 3.5–5.0)
ALK PHOS: 72 U/L (ref 38–126)
ALT: 18 U/L (ref 14–54)
AST: 21 U/L (ref 15–41)
Anion gap: 6 (ref 5–15)
BILIRUBIN TOTAL: 0.7 mg/dL (ref 0.3–1.2)
BUN: 12 mg/dL (ref 6–20)
CALCIUM: 9.5 mg/dL (ref 8.9–10.3)
CO2: 24 mmol/L (ref 22–32)
Chloride: 104 mmol/L (ref 101–111)
Creatinine, Ser: 0.7 mg/dL (ref 0.44–1.00)
GFR calc Af Amer: >60 mL/min (ref >60)
GFR calc non Af Amer: >60 mL/min (ref >60)
GLUCOSE: 169 mg/dL — AB (ref 65–99)
Potassium: 4.2 mmol/L (ref 3.5–5.1)
Sodium: 134 mmol/L — ABNORMAL LOW (ref 135–145)
TOTAL PROTEIN: 7.8 g/dL (ref 6.5–8.1)

## 2017-04-07 LAB — PROTIME-INR
INR: 0.95
Prothrombin Time: 12.6 seconds (ref 11.4–15.2)

## 2017-04-07 LAB — HEMOGLOBIN A1C
Hgb A1c MFr Bld: 9 % — ABNORMAL HIGH (ref 4.8–5.6)
Mean Plasma Glucose: 211.6 mg/dL

## 2017-04-07 LAB — SURGICAL PCR SCREEN
MRSA, PCR: NEGATIVE
Staphylococcus aureus: POSITIVE — AB

## 2017-04-07 LAB — ABO/RH: ABO/RH(D): O POS

## 2017-04-07 LAB — APTT: aPTT: 30 seconds (ref 24–36)

## 2017-04-07 NOTE — Progress Notes (Signed)
Pre-CABG testing has been completed.  04/07/17 11:01 AM Carlos Levering RVT

## 2017-04-07 NOTE — Pre-Procedure Instructions (Signed)
Christine [name]  04/07/2017      [place] ([place]), [place] [facility] [place] [address] [place] ([place]) [place] [address] Phone: [phone] Fax: [phone]    Your procedure is scheduled on  Friday 04/09/17  Report to [facility] Admitting at 530 A.M.  Call this number if you have problems the morning of surgery:  [phone]   Remember:  Do not eat food or drink liquids after midnight.  Take these medicines the morning of surgery with A SIP OF WATER - ISOSORBIDE MONONITRATE (IMDUR), METOPROLOL (TOPROL), PANTOPRAZOLE (PROTONIX)  7 days prior to surgery STOP taking any Aspirin (unless otherwise instructed by your surgeon), Aleve, Naproxen, Ibuprofen, Motrin, Advil, Goody's, BC's, all herbal medications, fish oil, and all vitamins   Do not wear jewelry, make-up or nail polish.  Do not wear lotions, powders, or perfumes, or deoderant.  Do not shave 48 hours prior to surgery.  Men may shave face and neck.  Do not bring valuables to the hospital.  [facility] is not responsible for any belongings or valuables.  Contacts, dentures or bridgework may not be worn into surgery.  Leave your suitcase in the car.  After surgery it may be brought to your room.  For patients admitted to the hospital, discharge time will be determined by your treatment team.  Patients discharged the day of surgery will not be allowed to drive home.   Name and phone number of your driver:    Special instructions:  [place] - Preparing for Surgery  Before surgery, you can play an important role.  Because skin is not sterile, your skin needs to be as free of germs as possible.  You can reduce the number of germs on you skin by washing with CHG (chlorahexidine gluconate) soap before surgery.  CHG is an antiseptic cleaner which kills germs and bonds with the skin to continue killing germs even after washing.  Please DO NOT use if you have an allergy to CHG or  antibacterial soaps.  If your skin becomes reddened/irritated stop using the CHG and inform your nurse when you arrive at Short Stay.  Do not shave (including legs and underarms) for at least 48 hours prior to the first CHG shower.  You may shave your face.  Please follow these instructions carefully:   1.  Shower with CHG Soap the night before surgery and the                                morning of Surgery.  2.  If you choose to wash your hair, wash your hair first as usual with your       normal shampoo.  3.  After you shampoo, rinse your hair and body thoroughly to remove the                      Shampoo.  4.  Use CHG as you would any other liquid soap.  You can apply chg directly       to the skin and wash gently with scrungie or a clean washcloth.  5.  Apply the CHG Soap to your body ONLY FROM THE NECK DOWN.        Do not use on open wounds or open sores.  Avoid contact with your eyes,       ears, mouth and genitals (private  parts).  Wash genitals (private parts)       with your normal soap.  6.  Wash thoroughly, paying special attention to the area where your surgery        will be performed.  7.  Thoroughly rinse your body with warm water from the neck down.  8.  DO NOT shower/wash with your normal soap after using and rinsing off       the CHG Soap.  9.  Pat yourself dry with a clean towel.            10.  Wear clean pajamas.            11.  Place clean sheets on your bed the night of your first shower and do not        sleep with pets.  Day of Surgery  Do not apply any lotions/deoderants the morning of surgery.  Please wear clean clothes to the hospital/surgery center.    Please read over the following fact sheets that you were given. MRSA Information and Surgical Site Infection Prevention

## 2017-04-08 ENCOUNTER — Encounter (HOSPITAL_COMMUNITY): Payer: Self-pay | Admitting: Certified Registered Nurse Anesthetist

## 2017-04-08 ENCOUNTER — Ambulatory Visit (HOSPITAL_COMMUNITY)
Admission: RE | Admit: 2017-04-08 | Discharge: 2017-04-08 | Disposition: A | Discharge status: Home / Self Care | Payer: Medicare Other | Source: Ambulatory Visit | Attending: Thoracic Surgery (Cardiothoracic Vascular Surgery) | Admitting: Thoracic Surgery (Cardiothoracic Vascular Surgery)

## 2017-04-08 DIAGNOSIS — R942 Abnormal results of pulmonary function studies: Secondary | ICD-10-CM

## 2017-04-08 DIAGNOSIS — Z951 Presence of aortocoronary bypass graft: Secondary | ICD-10-CM

## 2017-04-08 DIAGNOSIS — I2510 Atherosclerotic heart disease of native coronary artery without angina pectoris: Secondary | ICD-10-CM | POA: Insufficient documentation

## 2017-04-08 LAB — PULMONARY FUNCTION TEST
DL/VA % PRED: 124 %
DL/VA: 5.64 ml/min/mmHg/L
DLCO COR % PRED: 84 %
DLCO COR: 18.32 ml/min/mmHg
DLCO UNC % PRED: 84 %
DLCO unc: 18.2 ml/min/mmHg
FEF 25-75 PRE: 2.1 L/s
FEF 25-75 Post: 1.97 L/sec
FEF2575-%CHANGE-POST: -6 %
FEF2575-%PRED-POST: 97 %
FEF2575-%PRED-PRE: 104 %
FEV1-%Change-Post: -1 %
FEV1-%PRED-PRE: 80 %
FEV1-%Pred-Post: 79 %
FEV1-Post: 1.77 L
FEV1-Pre: 1.8 L
FEV1FVC-%CHANGE-POST: 1 %
FEV1FVC-%Pred-Pre: 109 %
FEV6-%CHANGE-POST: -3 %
FEV6-%PRED-PRE: 75 %
FEV6-%Pred-Post: 73 %
FEV6-PRE: 2.13 L
FEV6-Post: 2.06 L
FEV6FVC-%Change-Post: 0 %
FEV6FVC-%Pred-Post: 104 %
FEV6FVC-%Pred-Pre: 103 %
FVC-%Change-Post: -3 %
FVC-%PRED-POST: 70 %
FVC-%PRED-PRE: 73 %
FVC-POST: 2.06 L
FVC-PRE: 2.13 L
POST FEV1/FVC RATIO: 86 %
PRE FEV1/FVC RATIO: 84 %
Post FEV6/FVC ratio: 100 %
Pre FEV6/FVC Ratio: 100 %
RV % pred: 76 %
RV: 1.53 L
TLC % pred: 79 %
TLC: 3.79 L

## 2017-04-08 MED ORDER — VANCOMYCIN HCL 10 G IV SOLR
1250.0000 mg | INTRAVENOUS | Status: AC
  Administered 2017-04-09: 1250 mg via INTRAVENOUS
  Filled 2017-04-08: qty 1250

## 2017-04-08 MED ORDER — MAGNESIUM SULFATE 50 % IJ SOLN
40.0000 meq | INTRAMUSCULAR | Status: DC
  Filled 2017-04-08: qty 9.85

## 2017-04-08 MED ORDER — TRANEXAMIC ACID 1000 MG/10ML IV SOLN
1.5000 mg/kg/h | INTRAVENOUS | Status: AC
  Administered 2017-04-09: 1.5 mg/kg/h via INTRAVENOUS
  Filled 2017-04-08: qty 25

## 2017-04-08 MED ORDER — ALBUTEROL SULFATE (2.5 MG/3ML) 0.083% IN NEBU
2.5000 mg | INHALATION_SOLUTION | Freq: Once | RESPIRATORY_TRACT | Status: AC
  Administered 2017-04-08: 2.5 mg via RESPIRATORY_TRACT

## 2017-04-08 MED ORDER — DOPAMINE-DEXTROSE 3.2-5 MG/ML-% IV SOLN
0.0000 ug/kg/min | INTRAVENOUS | Status: DC
  Filled 2017-04-08: qty 250

## 2017-04-08 MED ORDER — SODIUM CHLORIDE 0.9 % IV SOLN
30.0000 ug/min | INTRAVENOUS | Status: AC
  Administered 2017-04-09: 25 ug/min via INTRAVENOUS
  Filled 2017-04-08: qty 2

## 2017-04-08 MED ORDER — SODIUM CHLORIDE 0.9 % IV SOLN
INTRAVENOUS | Status: DC
  Filled 2017-04-08: qty 30

## 2017-04-08 MED ORDER — POTASSIUM CHLORIDE 2 MEQ/ML IV SOLN
80.0000 meq | INTRAVENOUS | Status: DC
  Filled 2017-04-08: qty 40

## 2017-04-08 MED ORDER — SODIUM CHLORIDE 0.9 % IV SOLN
INTRAVENOUS | Status: AC
  Administered 2017-04-09: 1 [IU]/h via INTRAVENOUS
  Filled 2017-04-08: qty 1

## 2017-04-08 MED ORDER — TRANEXAMIC ACID (OHS) BOLUS VIA INFUSION
15.0000 mg/kg | INTRAVENOUS | Status: AC
  Administered 2017-04-09: 1012.5 mg via INTRAVENOUS
  Filled 2017-04-08: qty 1013

## 2017-04-08 MED ORDER — DEXMEDETOMIDINE HCL IN NACL 400 MCG/100ML IV SOLN
0.1000 ug/kg/h | INTRAVENOUS | Status: AC
  Administered 2017-04-09: .4 ug/kg/h via INTRAVENOUS
  Filled 2017-04-08: qty 100

## 2017-04-08 MED ORDER — TRANEXAMIC ACID (OHS) PUMP PRIME SOLUTION
2.0000 mg/kg | INTRAVENOUS | Status: DC
  Filled 2017-04-08: qty 1.35

## 2017-04-08 MED ORDER — DEXTROSE 5 % IV SOLN
750.0000 mg | INTRAVENOUS | Status: DC
  Administered 2017-04-09: 750 mg via INTRAVENOUS
  Filled 2017-04-08: qty 750

## 2017-04-08 MED ORDER — EPINEPHRINE PF 1 MG/ML IJ SOLN
0.0000 ug/min | INTRAVENOUS | Status: DC
  Filled 2017-04-08: qty 4

## 2017-04-08 MED ORDER — NITROGLYCERIN IN D5W 200-5 MCG/ML-% IV SOLN
2.0000 ug/min | INTRAVENOUS | Status: AC
  Administered 2017-04-09: 16.6 ug/min via INTRAVENOUS
  Filled 2017-04-08: qty 250

## 2017-04-08 MED ORDER — DEXTROSE 5 % IV SOLN
1.5000 g | INTRAVENOUS | Status: AC
  Administered 2017-04-09: 1.5 g via INTRAVENOUS
  Filled 2017-04-08: qty 1.5

## 2017-04-08 MED ORDER — PLASMA-LYTE 148 IV SOLN
INTRAVENOUS | Status: DC
  Filled 2017-04-08: qty 2.5

## 2017-04-08 NOTE — Progress Notes (Signed)
Anesthesia Chart Review:  Pt is a 66 year old female scheduled for CABG 4 on 04/09/2017 with Modesto Charon, M.D.  - PCP is Deland Pretty, MD  PMH includes: CAD, pre-diabetes, GERD. Never smoker. BMI 27.  Medications include: ASA 81 mg, Imdur, metoprolol, Protonix, rosuvastatin  BP (!) 132/59   Pulse 73   Temp 36.9 C   Resp 18   Ht 5\' 2"  (1.575 m)   Wt 148 lb 12.8 oz (67.5 kg)   SpO2 95%   BMI 27.22 kg/m   Preoperative labs reviewed.   - HbA1c 9, glucose 169. New onset DM.  I notified Manuela Schwartz in Dr. Leonarda Salon office.  I placed a consult for diabetes coordinator to see pt in hospital.   CXR 04/07/17: No acute abnormalities.  EKG 04/01/17: NSR  [place] pre-CABG 04/07/17:  - Right Carotid: There is evidence in the right ICA of a 1-39% stenosis. - Left Carotid: There is evidence in the left ICA of a 1-39% stenosis. - Vertebrals: Both vertebral arteries were patent with antegrade flow. - Right ABI: Resting right ankle-brachial index indicates mild right lower extremity arterial disease. - Left ABI: Resting left ankle-brachial index is within normal range. No evidence of significant left lower extremity arterial disease.  Cardiac cath 04/01/17:  - Severe multivessel CAD (LM 40%; LAD prox 99%, mid 70%, ostial D2 70%, D2 70%; CX ostial to proximal 80%, distal 60%, OM2 60%; RCA mid 80%) - Normal LVEDP - Recommend CT Surgery consult for CABG.  If no changes, I anticipate pt can proceed with surgery as scheduled.   Willeen Cass, FNP-BC [facility] Short Stay Surgical Center/Anesthesiology Phone: [phone] 04/08/2017 9:35 AM

## 2017-04-09 ENCOUNTER — Inpatient Hospital Stay (HOSPITAL_COMMUNITY)
Admission: RE | Disposition: A | Discharge status: Home / Self Care | Payer: Self-pay | Source: Ambulatory Visit | Attending: Thoracic Surgery (Cardiothoracic Vascular Surgery)

## 2017-04-09 ENCOUNTER — Inpatient Hospital Stay (HOSPITAL_COMMUNITY): Payer: Medicare Other | Admitting: Emergency Medicine

## 2017-04-09 ENCOUNTER — Inpatient Hospital Stay (HOSPITAL_COMMUNITY)
Admission: RE | Admit: 2017-04-09 | Discharge: 2017-04-14 | DRG: 236 | Disposition: A | Discharge status: Home / Self Care | Payer: Medicare Other | Source: Ambulatory Visit | Attending: Thoracic Surgery (Cardiothoracic Vascular Surgery) | Admitting: Thoracic Surgery (Cardiothoracic Vascular Surgery)

## 2017-04-09 ENCOUNTER — Inpatient Hospital Stay (HOSPITAL_COMMUNITY): Payer: Medicare Other

## 2017-04-09 ENCOUNTER — Encounter (HOSPITAL_COMMUNITY): Payer: Self-pay | Admitting: Orthopedic Surgery

## 2017-04-09 DIAGNOSIS — Z09 Encounter for follow-up examination after completed treatment for conditions other than malignant neoplasm: Secondary | ICD-10-CM

## 2017-04-09 DIAGNOSIS — Z85828 Personal history of other malignant neoplasm of skin: Secondary | ICD-10-CM

## 2017-04-09 DIAGNOSIS — E8770 Fluid overload, unspecified: Secondary | ICD-10-CM | POA: Diagnosis not present

## 2017-04-09 DIAGNOSIS — Z800 Family history of malignant neoplasm of digestive organs: Secondary | ICD-10-CM

## 2017-04-09 DIAGNOSIS — Z79899 Other long term (current) drug therapy: Secondary | ICD-10-CM | POA: Diagnosis not present

## 2017-04-09 DIAGNOSIS — I2510 Atherosclerotic heart disease of native coronary artery without angina pectoris: Secondary | ICD-10-CM | POA: Diagnosis not present

## 2017-04-09 DIAGNOSIS — Z7982 Long term (current) use of aspirin: Secondary | ICD-10-CM | POA: Diagnosis not present

## 2017-04-09 DIAGNOSIS — M810 Age-related osteoporosis without current pathological fracture: Secondary | ICD-10-CM | POA: Diagnosis present

## 2017-04-09 DIAGNOSIS — Z823 Family history of stroke: Secondary | ICD-10-CM | POA: Diagnosis not present

## 2017-04-09 DIAGNOSIS — K219 Gastro-esophageal reflux disease without esophagitis: Secondary | ICD-10-CM | POA: Diagnosis not present

## 2017-04-09 DIAGNOSIS — E119 Type 2 diabetes mellitus without complications: Secondary | ICD-10-CM | POA: Diagnosis not present

## 2017-04-09 DIAGNOSIS — Z8249 Family history of ischemic heart disease and other diseases of the circulatory system: Secondary | ICD-10-CM

## 2017-04-09 DIAGNOSIS — J9811 Atelectasis: Secondary | ICD-10-CM

## 2017-04-09 DIAGNOSIS — D62 Acute posthemorrhagic anemia: Secondary | ICD-10-CM | POA: Diagnosis not present

## 2017-04-09 DIAGNOSIS — Z808 Family history of malignant neoplasm of other organs or systems: Secondary | ICD-10-CM | POA: Diagnosis not present

## 2017-04-09 DIAGNOSIS — I25810 Atherosclerosis of coronary artery bypass graft(s) without angina pectoris: Secondary | ICD-10-CM | POA: Diagnosis not present

## 2017-04-09 DIAGNOSIS — I4891 Unspecified atrial fibrillation: Secondary | ICD-10-CM | POA: Diagnosis not present

## 2017-04-09 DIAGNOSIS — I25119 Atherosclerotic heart disease of native coronary artery with unspecified angina pectoris: Principal | ICD-10-CM | POA: Diagnosis present

## 2017-04-09 DIAGNOSIS — I351 Nonrheumatic aortic (valve) insufficiency: Secondary | ICD-10-CM | POA: Diagnosis not present

## 2017-04-09 DIAGNOSIS — R112 Nausea with vomiting, unspecified: Secondary | ICD-10-CM | POA: Diagnosis not present

## 2017-04-09 DIAGNOSIS — Z951 Presence of aortocoronary bypass graft: Secondary | ICD-10-CM

## 2017-04-09 DIAGNOSIS — R0902 Hypoxemia: Secondary | ICD-10-CM | POA: Diagnosis not present

## 2017-04-09 DIAGNOSIS — J90 Pleural effusion, not elsewhere classified: Secondary | ICD-10-CM | POA: Diagnosis not present

## 2017-04-09 HISTORY — PX: CORONARY ARTERY BYPASS GRAFT: SHX141

## 2017-04-09 HISTORY — PX: TEE WITHOUT CARDIOVERSION: SHX5443

## 2017-04-09 LAB — POCT I-STAT 3, ART BLOOD GAS (G3+)
ACID-BASE DEFICIT: 3 mmol/L — AB (ref 0.0–2.0)
ACID-BASE DEFICIT: 6 mmol/L — AB (ref 0.0–2.0)
Acid-Base Excess: 3 mmol/L — ABNORMAL HIGH (ref 0.0–2.0)
Acid-base deficit: 1 mmol/L (ref 0.0–2.0)
BICARBONATE: 22.3 mmol/L (ref 20.0–28.0)
Bicarbonate: 26 mmol/L (ref 20.0–28.0)
Bicarbonate: 24.1 mmol/L (ref 20.0–28.0)
Bicarbonate: 20.8 mmol/L (ref 20.0–28.0)
Bicarbonate: 25.6 mmol/L (ref 20.0–28.0)
Bicarbonate: 26.8 mmol/L (ref 20.0–28.0)
Bicarbonate: 25.2 mmol/L (ref 20.0–28.0)
O2 SAT: 100 %
O2 SAT: 98 %
O2 SAT: 99 %
O2 SAT: 96 %
O2 Saturation: 100 %
O2 Saturation: 98 %
O2 Saturation: 96 %
PCO2 ART: 32 mmHg (ref 32.0–48.0)
PCO2 ART: 53.6 mmHg — AB (ref 32.0–48.0)
PCO2 ART: 46.4 mmHg (ref 32.0–48.0)
PH ART: 7.505 — AB (ref 7.350–7.450)
PH ART: 7.279 — AB (ref 7.350–7.450)
PH ART: 7.309 — AB (ref 7.350–7.450)
PO2 ART: 400 mmHg — AB (ref 83.0–108.0)
PO2 ART: 285 mmHg — AB (ref 83.0–108.0)
PO2 ART: 109 mmHg — AB (ref 83.0–108.0)
PO2 ART: 94 mmHg (ref 83.0–108.0)
PO2 ART: 86 mmHg (ref 83.0–108.0)
Patient temperature: 34.5
Patient temperature: 37.5
Patient temperature: 37.7
TCO2: 27 mmol/L (ref 22–32)
TCO2: 23 mmol/L (ref 22–32)
TCO2: 26 mmol/L (ref 22–32)
TCO2: 22 mmol/L (ref 22–32)
TCO2: 27 mmol/L (ref 22–32)
TCO2: 28 mmol/L (ref 22–32)
TCO2: 27 mmol/L (ref 22–32)
pCO2 arterial: 28.3 mmHg — ABNORMAL LOW (ref 32.0–48.0)
pCO2 arterial: 46 mmHg (ref 32.0–48.0)
pCO2 arterial: 43.8 mmHg (ref 32.0–48.0)
pCO2 arterial: 48.8 mmHg — ABNORMAL HIGH (ref 32.0–48.0)
pH, Arterial: 7.517 — ABNORMAL HIGH (ref 7.350–7.450)
pH, Arterial: 7.314 — ABNORMAL LOW (ref 7.350–7.450)
pH, Arterial: 7.328 — ABNORMAL LOW (ref 7.350–7.450)
pH, Arterial: 7.347 — ABNORMAL LOW (ref 7.350–7.450)
pO2, Arterial: 184 mmHg — ABNORMAL HIGH (ref 83.0–108.0)
pO2, Arterial: 110 mmHg — ABNORMAL HIGH (ref 83.0–108.0)

## 2017-04-09 LAB — POCT I-STAT, CHEM 8
BUN: 14 mg/dL (ref 6–20)
BUN: 11 mg/dL (ref 6–20)
BUN: 13 mg/dL (ref 6–20)
BUN: 11 mg/dL (ref 6–20)
BUN: 10 mg/dL (ref 6–20)
BUN: 8 mg/dL (ref 6–20)
CALCIUM ION: 1.02 mmol/L — AB (ref 1.15–1.40)
CALCIUM ION: 1.19 mmol/L (ref 1.15–1.40)
CALCIUM ION: 1.06 mmol/L — AB (ref 1.15–1.40)
CHLORIDE: 105 mmol/L (ref 101–111)
CHLORIDE: 104 mmol/L (ref 101–111)
CHLORIDE: 108 mmol/L (ref 101–111)
CREATININE: 0.5 mg/dL (ref 0.44–1.00)
CREATININE: 0.5 mg/dL (ref 0.44–1.00)
Calcium, Ion: 1.22 mmol/L (ref 1.15–1.40)
Calcium, Ion: 1.02 mmol/L — ABNORMAL LOW (ref 1.15–1.40)
Calcium, Ion: 1.3 mmol/L (ref 1.15–1.40)
Chloride: 103 mmol/L (ref 101–111)
Chloride: 100 mmol/L — ABNORMAL LOW (ref 101–111)
Chloride: 108 mmol/L (ref 101–111)
Creatinine, Ser: 0.4 mg/dL — ABNORMAL LOW (ref 0.44–1.00)
Creatinine, Ser: 0.6 mg/dL (ref 0.44–1.00)
Creatinine, Ser: 0.4 mg/dL — ABNORMAL LOW (ref 0.44–1.00)
Creatinine, Ser: 0.4 mg/dL — ABNORMAL LOW (ref 0.44–1.00)
GLUCOSE: 220 mg/dL — AB (ref 65–99)
GLUCOSE: 228 mg/dL — AB (ref 65–99)
Glucose, Bld: 191 mg/dL — ABNORMAL HIGH (ref 65–99)
Glucose, Bld: 182 mg/dL — ABNORMAL HIGH (ref 65–99)
Glucose, Bld: 157 mg/dL — ABNORMAL HIGH (ref 65–99)
Glucose, Bld: 139 mg/dL — ABNORMAL HIGH (ref 65–99)
HCT: 29 % — ABNORMAL LOW (ref 36.0–46.0)
HCT: 28 % — ABNORMAL LOW (ref 36.0–46.0)
HEMATOCRIT: 24 % — AB (ref 36.0–46.0)
HEMATOCRIT: 23 % — AB (ref 36.0–46.0)
HEMATOCRIT: 22 % — AB (ref 36.0–46.0)
HEMATOCRIT: 25 % — AB (ref 36.0–46.0)
HEMOGLOBIN: 9.9 g/dL — AB (ref 12.0–15.0)
HEMOGLOBIN: 9.5 g/dL — AB (ref 12.0–15.0)
HEMOGLOBIN: 7.5 g/dL — AB (ref 12.0–15.0)
Hemoglobin: 8.2 g/dL — ABNORMAL LOW (ref 12.0–15.0)
Hemoglobin: 7.8 g/dL — ABNORMAL LOW (ref 12.0–15.0)
Hemoglobin: 8.5 g/dL — ABNORMAL LOW (ref 12.0–15.0)
POTASSIUM: 4 mmol/L (ref 3.5–5.1)
POTASSIUM: 3.8 mmol/L (ref 3.5–5.1)
POTASSIUM: 4.5 mmol/L (ref 3.5–5.1)
Potassium: 3.3 mmol/L — ABNORMAL LOW (ref 3.5–5.1)
Potassium: 3.6 mmol/L (ref 3.5–5.1)
Potassium: 4.3 mmol/L (ref 3.5–5.1)
SODIUM: 138 mmol/L (ref 135–145)
SODIUM: 140 mmol/L (ref 135–145)
SODIUM: 139 mmol/L (ref 135–145)
Sodium: 138 mmol/L (ref 135–145)
Sodium: 143 mmol/L (ref 135–145)
Sodium: 145 mmol/L (ref 135–145)
TCO2: 26 mmol/L (ref 22–32)
TCO2: 23 mmol/L (ref 22–32)
TCO2: 26 mmol/L (ref 22–32)
TCO2: 27 mmol/L (ref 22–32)
TCO2: 23 mmol/L (ref 22–32)
TCO2: 26 mmol/L (ref 22–32)

## 2017-04-09 LAB — APTT: aPTT: 36 seconds (ref 24–36)

## 2017-04-09 LAB — CREATININE, SERUM
CREATININE: 0.69 mg/dL (ref 0.44–1.00)
GFR calc Af Amer: >60 mL/min (ref >60)
GFR calc non Af Amer: >60 mL/min (ref >60)

## 2017-04-09 LAB — GLUCOSE, CAPILLARY
GLUCOSE-CAPILLARY: 81 mg/dL (ref 65–99)
GLUCOSE-CAPILLARY: 101 mg/dL — AB (ref 65–99)
GLUCOSE-CAPILLARY: 97 mg/dL (ref 65–99)
GLUCOSE-CAPILLARY: 116 mg/dL — AB (ref 65–99)
GLUCOSE-CAPILLARY: 141 mg/dL — AB (ref 65–99)
GLUCOSE-CAPILLARY: 134 mg/dL — AB (ref 65–99)
GLUCOSE-CAPILLARY: 105 mg/dL — AB (ref 65–99)
GLUCOSE-CAPILLARY: 133 mg/dL — AB (ref 65–99)
GLUCOSE-CAPILLARY: 112 mg/dL — AB (ref 65–99)

## 2017-04-09 LAB — CBC
HEMATOCRIT: 29.3 % — AB (ref 36.0–46.0)
HEMATOCRIT: 27.5 % — AB (ref 36.0–46.0)
HEMOGLOBIN: 9.7 g/dL — AB (ref 12.0–15.0)
HEMOGLOBIN: 9 g/dL — AB (ref 12.0–15.0)
MCH: 28.5 pg (ref 26.0–34.0)
MCH: 28.4 pg (ref 26.0–34.0)
MCHC: 33.1 g/dL (ref 30.0–36.0)
MCHC: 32.7 g/dL (ref 30.0–36.0)
MCV: 86.2 fL (ref 78.0–100.0)
MCV: 86.8 fL (ref 78.0–100.0)
Platelets: 212 10*3/uL (ref 150–400)
Platelets: 193 10*3/uL (ref 150–400)
RBC: 3.4 MIL/uL — AB (ref 3.87–5.11)
RBC: 3.17 MIL/uL — ABNORMAL LOW (ref 3.87–5.11)
RDW: 12.7 % (ref 11.5–15.5)
RDW: 13 % (ref 11.5–15.5)
WBC: 19.3 10*3/uL — ABNORMAL HIGH (ref 4.0–10.5)
WBC: 17 10*3/uL — AB (ref 4.0–10.5)

## 2017-04-09 LAB — POCT I-STAT 4, (NA,K, GLUC, HGB,HCT)
Glucose, Bld: 111 mg/dL — ABNORMAL HIGH (ref 65–99)
HEMATOCRIT: 28 % — AB (ref 36.0–46.0)
Hemoglobin: 9.5 g/dL — ABNORMAL LOW (ref 12.0–15.0)
Potassium: 3.7 mmol/L (ref 3.5–5.1)
SODIUM: 145 mmol/L (ref 135–145)

## 2017-04-09 LAB — MAGNESIUM: Magnesium: 2.8 mg/dL — ABNORMAL HIGH (ref 1.7–2.4)

## 2017-04-09 LAB — PLATELET COUNT: PLATELETS: 193 10*3/uL (ref 150–400)

## 2017-04-09 LAB — PROTIME-INR
INR: 1.39
PROTHROMBIN TIME: 16.9 s — AB (ref 11.4–15.2)

## 2017-04-09 LAB — HEMOGLOBIN AND HEMATOCRIT, BLOOD
HEMATOCRIT: 23.7 % — AB (ref 36.0–46.0)
HEMOGLOBIN: 7.9 g/dL — AB (ref 12.0–15.0)

## 2017-04-09 LAB — PREPARE RBC (CROSSMATCH)

## 2017-04-09 SURGERY — CORONARY ARTERY BYPASS GRAFTING (CABG)
Anesthesia: General | Site: Chest

## 2017-04-09 MED ORDER — MORPHINE SULFATE (PF) 2 MG/ML IV SOLN: 2.0000 mg | INTRAVENOUS | Status: DC | PRN

## 2017-04-09 MED ORDER — MAGNESIUM SULFATE 4 GM/100ML IV SOLN
4.0000 g | Freq: Once | INTRAVENOUS | Status: AC
  Administered 2017-04-09: 4 g via INTRAVENOUS

## 2017-04-09 MED ORDER — PROTAMINE SULFATE 10 MG/ML IV SOLN
INTRAVENOUS | Status: AC
  Filled 2017-04-09: qty 5

## 2017-04-09 MED ORDER — POTASSIUM CHLORIDE 10 MEQ/50ML IV SOLN
10.0000 meq | INTRAVENOUS | Status: AC
  Administered 2017-04-09 (×3): 10 meq via INTRAVENOUS

## 2017-04-09 MED ORDER — 0.9 % SODIUM CHLORIDE (POUR BTL) OPTIME
TOPICAL | Status: DC | PRN
  Administered 2017-04-09 (×5): 1000 mL

## 2017-04-09 MED ORDER — SODIUM CHLORIDE 0.9 % IV SOLN
INTRAVENOUS | Status: DC
  Filled 2017-04-09: qty 1

## 2017-04-09 MED ORDER — PANTOPRAZOLE SODIUM 40 MG PO TBEC
40.0000 mg | DELAYED_RELEASE_TABLET | Freq: Every day | ORAL | Status: DC
  Administered 2017-04-11: 40 mg via ORAL
  Filled 2017-04-09: qty 1

## 2017-04-09 MED ORDER — SODIUM BICARBONATE 8.4 % IV SOLN
100.0000 meq | Freq: Once | INTRAVENOUS | Status: AC
  Administered 2017-04-09: 100 meq via INTRAVENOUS

## 2017-04-09 MED ORDER — HEMOSTATIC AGENTS (NO CHARGE) OPTIME
TOPICAL | Status: DC | PRN
  Administered 2017-04-09 (×4): 1 via TOPICAL

## 2017-04-09 MED ORDER — CHLORHEXIDINE GLUCONATE 4 % EX LIQD: 30.0000 mL | CUTANEOUS | Status: DC

## 2017-04-09 MED ORDER — FENTANYL CITRATE (PF) 250 MCG/5ML IJ SOLN
INTRAMUSCULAR | Status: DC | PRN
  Administered 2017-04-09 (×3): 100 ug via INTRAVENOUS
  Administered 2017-04-09: 150 ug via INTRAVENOUS
  Administered 2017-04-09: 50 ug via INTRAVENOUS
  Administered 2017-04-09 (×2): 100 ug via INTRAVENOUS
  Administered 2017-04-09: 50 ug via INTRAVENOUS
  Administered 2017-04-09: 150 ug via INTRAVENOUS
  Administered 2017-04-09: 100 ug via INTRAVENOUS
  Administered 2017-04-09: 50 ug via INTRAVENOUS
  Administered 2017-04-09: 150 ug via INTRAVENOUS
  Administered 2017-04-09: 50 ug via INTRAVENOUS

## 2017-04-09 MED ORDER — PLASMA-LYTE 148 IV SOLN
INTRAVENOUS | Status: AC | PRN
  Administered 2017-04-09: 500 mL via INTRAVENOUS

## 2017-04-09 MED ORDER — ACETAMINOPHEN 500 MG PO TABS
1000.0000 mg | ORAL_TABLET | Freq: Four times a day (QID) | ORAL | Status: DC
  Administered 2017-04-10 – 2017-04-14 (×15): 1000 mg via ORAL
  Filled 2017-04-09 (×16): qty 2

## 2017-04-09 MED ORDER — ALBUMIN HUMAN 5 % IV SOLN
INTRAVENOUS | Status: AC
  Administered 2017-04-09: 250 mL via INTRAVENOUS
  Filled 2017-04-09: qty 250

## 2017-04-09 MED ORDER — MUPIROCIN 2 % EX OINT
TOPICAL_OINTMENT | Freq: Two times a day (BID) | CUTANEOUS | Status: DC
  Administered 2017-04-09 – 2017-04-14 (×10): via NASAL
  Filled 2017-04-09 (×6): qty 22

## 2017-04-09 MED ORDER — METOPROLOL TARTRATE 12.5 MG HALF TABLET: 12.5000 mg | ORAL_TABLET | Freq: Once | ORAL | Status: DC

## 2017-04-09 MED ORDER — LACTATED RINGERS IV SOLN
INTRAVENOUS | Status: DC | PRN
  Administered 2017-04-09 (×4): via INTRAVENOUS

## 2017-04-09 MED ORDER — ALBUMIN HUMAN 5 % IV SOLN
INTRAVENOUS | Status: DC | PRN
  Administered 2017-04-09: 12:00:00 via INTRAVENOUS
  Administered 2017-04-09: 12.5 g
  Administered 2017-04-09 (×2): via INTRAVENOUS

## 2017-04-09 MED ORDER — MORPHINE SULFATE (PF) 2 MG/ML IV SOLN: 1.0000 mg | INTRAVENOUS | Status: AC | PRN

## 2017-04-09 MED ORDER — CALCIUM CHLORIDE 10 % IV SOLN
1.0000 g | Freq: Once | INTRAVENOUS | Status: AC
  Administered 2017-04-09: 1 g via INTRAVENOUS

## 2017-04-09 MED ORDER — DEXTROSE 5 % IV SOLN
1.5000 g | Freq: Two times a day (BID) | INTRAVENOUS | Status: AC
  Administered 2017-04-09 – 2017-04-11 (×4): 1.5 g via INTRAVENOUS
  Filled 2017-04-09 (×5): qty 1.5

## 2017-04-09 MED ORDER — BISACODYL 5 MG PO TBEC
10.0000 mg | DELAYED_RELEASE_TABLET | Freq: Every day | ORAL | Status: DC
  Administered 2017-04-10 – 2017-04-14 (×5): 10 mg via ORAL
  Filled 2017-04-09 (×6): qty 2

## 2017-04-09 MED ORDER — LACTATED RINGERS IV SOLN: INTRAVENOUS | Status: DC

## 2017-04-09 MED ORDER — CHLORHEXIDINE GLUCONATE 0.12 % MT SOLN
15.0000 mL | Freq: Once | OROMUCOSAL | Status: AC
  Administered 2017-04-09: 15 mL via OROMUCOSAL
  Filled 2017-04-09: qty 15

## 2017-04-09 MED ORDER — LACTATED RINGERS IV SOLN
500.0000 mL | Freq: Once | INTRAVENOUS | Status: AC | PRN
  Administered 2017-04-09: 500 mL via INTRAVENOUS

## 2017-04-09 MED ORDER — SODIUM CHLORIDE 0.9% FLUSH: 3.0000 mL | INTRAVENOUS | Status: DC | PRN

## 2017-04-09 MED ORDER — OXYCODONE HCL 5 MG PO TABS
5.0000 mg | ORAL_TABLET | ORAL | Status: DC | PRN
  Administered 2017-04-11 – 2017-04-12 (×3): 10 mg via ORAL
  Administered 2017-04-12: 5 mg via ORAL
  Filled 2017-04-09 (×3): qty 2
  Filled 2017-04-09 (×2): qty 1
  Filled 2017-04-09: qty 2

## 2017-04-09 MED ORDER — FAMOTIDINE IN NACL 20-0.9 MG/50ML-% IV SOLN
20.0000 mg | Freq: Two times a day (BID) | INTRAVENOUS | Status: AC
  Administered 2017-04-09: 20 mg via INTRAVENOUS

## 2017-04-09 MED ORDER — TRAMADOL HCL 50 MG PO TABS
50.0000 mg | ORAL_TABLET | ORAL | Status: DC | PRN
  Administered 2017-04-10: 100 mg via ORAL
  Administered 2017-04-10: 50 mg via ORAL
  Administered 2017-04-12: 100 mg via ORAL
  Filled 2017-04-09: qty 2
  Filled 2017-04-09: qty 1
  Filled 2017-04-09: qty 2

## 2017-04-09 MED ORDER — MIDAZOLAM HCL 5 MG/5ML IJ SOLN
INTRAMUSCULAR | Status: DC | PRN
  Administered 2017-04-09: 2 mg via INTRAVENOUS
  Administered 2017-04-09: 6 mg via INTRAVENOUS
  Administered 2017-04-09: 2 mg via INTRAVENOUS

## 2017-04-09 MED ORDER — BISACODYL 10 MG RE SUPP: 10.0000 mg | Freq: Every day | RECTAL | Status: DC

## 2017-04-09 MED ORDER — FENTANYL CITRATE (PF) 250 MCG/5ML IJ SOLN
INTRAMUSCULAR | Status: AC
  Filled 2017-04-09: qty 5

## 2017-04-09 MED ORDER — ACETAMINOPHEN 650 MG RE SUPP
650.0000 mg | Freq: Once | RECTAL | Status: AC
  Administered 2017-04-09: 650 mg via RECTAL

## 2017-04-09 MED ORDER — ALBUTEROL SULFATE (2.5 MG/3ML) 0.083% IN NEBU
INHALATION_SOLUTION | RESPIRATORY_TRACT | Status: AC
  Administered 2017-04-09: 2.5 mg via RESPIRATORY_TRACT
  Filled 2017-04-09: qty 3

## 2017-04-09 MED ORDER — NITROGLYCERIN IN D5W 200-5 MCG/ML-% IV SOLN: 0.0000 ug/min | INTRAVENOUS | Status: DC

## 2017-04-09 MED ORDER — VANCOMYCIN HCL IN DEXTROSE 1-5 GM/200ML-% IV SOLN
1000.0000 mg | Freq: Once | INTRAVENOUS | Status: DC
  Filled 2017-04-09: qty 200

## 2017-04-09 MED ORDER — SODIUM CHLORIDE 0.9 % IV SOLN
1.0000 g | Freq: Once | INTRAVENOUS | Status: DC
  Filled 2017-04-09: qty 10

## 2017-04-09 MED ORDER — PROPOFOL 10 MG/ML IV BOLUS
INTRAVENOUS | Status: DC | PRN
  Administered 2017-04-09: 90 mg via INTRAVENOUS

## 2017-04-09 MED ORDER — SODIUM CHLORIDE 0.9 % IV SOLN
INTRAVENOUS | Status: DC
  Administered 2017-04-09: 13:00:00 via INTRAVENOUS

## 2017-04-09 MED ORDER — ROCURONIUM BROMIDE 10 MG/ML (PF) SYRINGE
PREFILLED_SYRINGE | INTRAVENOUS | Status: DC | PRN
  Administered 2017-04-09 (×2): 50 mg via INTRAVENOUS
  Administered 2017-04-09: 30 mg via INTRAVENOUS

## 2017-04-09 MED ORDER — ROCURONIUM BROMIDE 10 MG/ML (PF) SYRINGE
PREFILLED_SYRINGE | INTRAVENOUS | Status: AC
  Filled 2017-04-09: qty 5

## 2017-04-09 MED ORDER — MAGNESIUM SULFATE 4 GM/100ML IV SOLN
INTRAVENOUS | Status: AC
  Administered 2017-04-09: 4 g via INTRAVENOUS
  Filled 2017-04-09: qty 100

## 2017-04-09 MED ORDER — ASPIRIN 81 MG PO CHEW
324.0000 mg | CHEWABLE_TABLET | Freq: Every day | ORAL | Status: DC
  Administered 2017-04-11 – 2017-04-14 (×2): 324 mg
  Filled 2017-04-09 (×4): qty 4

## 2017-04-09 MED ORDER — SODIUM CHLORIDE 0.9 % IV SOLN
INTRAVENOUS | Status: DC | PRN
  Administered 2017-04-09: 12:00:00 via INTRAVENOUS

## 2017-04-09 MED ORDER — PHENYLEPHRINE HCL 10 MG/ML IJ SOLN
0.0000 ug/min | INTRAMUSCULAR | Status: DC
  Administered 2017-04-09: 90 ug/min via INTRAVENOUS
  Administered 2017-04-09: 100 ug/min via INTRAVENOUS
  Administered 2017-04-10: 80 ug/min via INTRAVENOUS
  Administered 2017-04-10: 50 ug/min via INTRAVENOUS
  Filled 2017-04-09 (×4): qty 2

## 2017-04-09 MED ORDER — DOPAMINE-DEXTROSE 3.2-5 MG/ML-% IV SOLN
3.0000 ug/kg/min | INTRAVENOUS | Status: DC
  Administered 2017-04-09: 3 ug/kg/min via INTRAVENOUS

## 2017-04-09 MED ORDER — PROTAMINE SULFATE 10 MG/ML IV SOLN
INTRAVENOUS | Status: DC | PRN
  Administered 2017-04-09: 250 mg via INTRAVENOUS
  Administered 2017-04-09: 25 mg via INTRAVENOUS

## 2017-04-09 MED ORDER — ALBUMIN HUMAN 5 % IV SOLN: 12.5000 g | Freq: Once | INTRAVENOUS | Status: AC

## 2017-04-09 MED ORDER — ALBUTEROL SULFATE (2.5 MG/3ML) 0.083% IN NEBU
2.5000 mg | INHALATION_SOLUTION | Freq: Once | RESPIRATORY_TRACT | Status: AC
  Administered 2017-04-09: 2.5 mg via RESPIRATORY_TRACT

## 2017-04-09 MED ORDER — ALBUMIN HUMAN 5 % IV SOLN
250.0000 mL | INTRAVENOUS | Status: AC | PRN
  Administered 2017-04-09 (×5): 250 mL via INTRAVENOUS

## 2017-04-09 MED ORDER — ALBUMIN HUMAN 5 % IV SOLN
12.5000 g | Freq: Once | INTRAVENOUS | Status: AC
  Administered 2017-04-09: 12.5 g via INTRAVENOUS

## 2017-04-09 MED ORDER — SODIUM CHLORIDE 0.45 % IV SOLN
INTRAVENOUS | Status: DC | PRN
  Administered 2017-04-09: 13:00:00 via INTRAVENOUS

## 2017-04-09 MED ORDER — ORAL CARE MOUTH RINSE
15.0000 mL | Freq: Two times a day (BID) | OROMUCOSAL | Status: DC
Start: 2017-04-09 — End: 2017-04-11
  Administered 2017-04-09 – 2017-04-11 (×3): 15 mL via OROMUCOSAL

## 2017-04-09 MED ORDER — METOPROLOL TARTRATE 12.5 MG HALF TABLET
12.5000 mg | ORAL_TABLET | Freq: Two times a day (BID) | ORAL | Status: DC
  Filled 2017-04-09: qty 1

## 2017-04-09 MED ORDER — DOCUSATE SODIUM 100 MG PO CAPS
200.0000 mg | ORAL_CAPSULE | Freq: Every day | ORAL | Status: DC
  Administered 2017-04-10 – 2017-04-14 (×5): 200 mg via ORAL
  Filled 2017-04-09 (×6): qty 2

## 2017-04-09 MED ORDER — ARTIFICIAL TEARS OPHTHALMIC OINT
TOPICAL_OINTMENT | OPHTHALMIC | Status: DC | PRN
Start: 2017-04-09 — End: 2017-04-09
  Administered 2017-04-09: 1 via OPHTHALMIC

## 2017-04-09 MED ORDER — MIDAZOLAM HCL 2 MG/2ML IJ SOLN: 2.0000 mg | INTRAMUSCULAR | Status: DC | PRN

## 2017-04-09 MED ORDER — SODIUM CHLORIDE 0.9% FLUSH
3.0000 mL | Freq: Two times a day (BID) | INTRAVENOUS | Status: DC
  Administered 2017-04-10 – 2017-04-14 (×5): 3 mL via INTRAVENOUS

## 2017-04-09 MED ORDER — ACETAMINOPHEN 160 MG/5ML PO SOLN: 1000.0000 mg | Freq: Four times a day (QID) | ORAL | Status: DC

## 2017-04-09 MED ORDER — METOPROLOL TARTRATE 25 MG/10 ML ORAL SUSPENSION: 12.5000 mg | Freq: Two times a day (BID) | ORAL | Status: DC

## 2017-04-09 MED ORDER — ONDANSETRON HCL 4 MG/2ML IJ SOLN
4.0000 mg | Freq: Four times a day (QID) | INTRAMUSCULAR | Status: DC | PRN
  Administered 2017-04-09 – 2017-04-14 (×13): 4 mg via INTRAVENOUS
  Filled 2017-04-09 (×13): qty 2

## 2017-04-09 MED ORDER — SODIUM CHLORIDE 0.9 % IV SOLN: 250.0000 mL | INTRAVENOUS | Status: DC

## 2017-04-09 MED ORDER — PROPOFOL 10 MG/ML IV BOLUS
INTRAVENOUS | Status: AC
  Filled 2017-04-09: qty 20

## 2017-04-09 MED ORDER — ROSUVASTATIN CALCIUM 10 MG PO TABS
40.0000 mg | ORAL_TABLET | Freq: Every day | ORAL | Status: DC
  Administered 2017-04-10 – 2017-04-13 (×4): 40 mg via ORAL
  Filled 2017-04-09: qty 2
  Filled 2017-04-09: qty 1
  Filled 2017-04-09: qty 2
  Filled 2017-04-09 (×2): qty 4
  Filled 2017-04-09 (×2): qty 1

## 2017-04-09 MED ORDER — ASPIRIN EC 325 MG PO TBEC
325.0000 mg | DELAYED_RELEASE_TABLET | Freq: Every day | ORAL | Status: DC
  Administered 2017-04-10 – 2017-04-13 (×3): 325 mg via ORAL
  Filled 2017-04-09 (×6): qty 1

## 2017-04-09 MED ORDER — INSULIN REGULAR BOLUS VIA INFUSION
0.0000 [IU] | Freq: Three times a day (TID) | INTRAVENOUS | Status: DC
  Filled 2017-04-09: qty 10

## 2017-04-09 MED ORDER — FAMOTIDINE IN NACL 20-0.9 MG/50ML-% IV SOLN: 20.0000 mg | Freq: Two times a day (BID) | INTRAVENOUS | Status: DC

## 2017-04-09 MED ORDER — ALBUMIN HUMAN 5 % IV SOLN
INTRAVENOUS | Status: AC
  Administered 2017-04-09: 12.5 g via INTRAVENOUS
  Filled 2017-04-09: qty 250

## 2017-04-09 MED ORDER — PHENYLEPHRINE 40 MCG/ML (10ML) SYRINGE FOR IV PUSH (FOR BLOOD PRESSURE SUPPORT)
PREFILLED_SYRINGE | INTRAVENOUS | Status: AC
  Filled 2017-04-09: qty 50

## 2017-04-09 MED ORDER — HEPARIN SODIUM (PORCINE) 1000 UNIT/ML IJ SOLN
INTRAMUSCULAR | Status: DC | PRN
  Administered 2017-04-09: 2000 [IU] via INTRAVENOUS
  Administered 2017-04-09: 22000 [IU] via INTRAVENOUS

## 2017-04-09 MED ORDER — FENTANYL CITRATE (PF) 250 MCG/5ML IJ SOLN
INTRAMUSCULAR | Status: AC
  Filled 2017-04-09: qty 20

## 2017-04-09 MED ORDER — MORPHINE SULFATE (PF) 4 MG/ML IV SOLN
2.0000 mg | INTRAVENOUS | Status: DC | PRN
  Administered 2017-04-10: 2 mg via INTRAVENOUS
  Filled 2017-04-09 (×2): qty 1

## 2017-04-09 MED ORDER — ACETAMINOPHEN 160 MG/5ML PO SOLN: 650.0000 mg | Freq: Once | ORAL | Status: AC

## 2017-04-09 MED ORDER — CALCIUM CHLORIDE 10 % IV SOLN
INTRAVENOUS | Status: DC | PRN
  Administered 2017-04-09 (×3): 300 mg via INTRAVENOUS

## 2017-04-09 MED ORDER — CHLORHEXIDINE GLUCONATE 0.12 % MT SOLN
15.0000 mL | OROMUCOSAL | Status: AC
  Administered 2017-04-09: 15 mL via OROMUCOSAL

## 2017-04-09 MED ORDER — MIDAZOLAM HCL 10 MG/2ML IJ SOLN
INTRAMUSCULAR | Status: AC
  Filled 2017-04-09: qty 2

## 2017-04-09 MED ORDER — SODIUM CHLORIDE 0.9 % IV SOLN
0.0000 ug/kg/h | INTRAVENOUS | Status: DC
  Filled 2017-04-09: qty 2

## 2017-04-09 MED ORDER — CALCIUM CHLORIDE 10 % IV SOLN
INTRAVENOUS | Status: AC
  Filled 2017-04-09: qty 10

## 2017-04-09 MED ORDER — METOPROLOL TARTRATE 5 MG/5ML IV SOLN: 2.5000 mg | INTRAVENOUS | Status: DC | PRN

## 2017-04-09 SURGICAL SUPPLY — 88 items
ADH SKN CLS APL DERMABOND .7 (GAUZE/BANDAGES/DRESSINGS) ×1
BAG DECANTER FOR FLEXI CONT (MISCELLANEOUS) ×2 IMPLANT
BANDAGE ACE 4X5 VEL STRL LF (GAUZE/BANDAGES/DRESSINGS) ×2 IMPLANT
BANDAGE ACE 6X5 VEL STRL LF (GAUZE/BANDAGES/DRESSINGS) ×2 IMPLANT
BANDAGE ELASTIC 4 VELCRO ST LF (GAUZE/BANDAGES/DRESSINGS) ×1 IMPLANT
BANDAGE ELASTIC 6 VELCRO ST LF (GAUZE/BANDAGES/DRESSINGS) ×1 IMPLANT
BASKET HEART (ORDER IN 25'S) (MISCELLANEOUS) ×1
BASKET HEART (ORDER IN 25S) (MISCELLANEOUS) ×1 IMPLANT
BLADE STERNUM SYSTEM 6 (BLADE) ×2 IMPLANT
BNDG GAUZE ELAST 4 BULKY (GAUZE/BANDAGES/DRESSINGS) ×2 IMPLANT
CANISTER SUCT 3000ML PPV (MISCELLANEOUS) ×2 IMPLANT
CANNULA EZ GLIDE AORTIC 21FR (CANNULA) ×2 IMPLANT
CATH CPB KIT HENDRICKSON (MISCELLANEOUS) ×2 IMPLANT
CATH ROBINSON RED A/P 18FR (CATHETERS) ×2 IMPLANT
CATH THORACIC 36FR (CATHETERS) ×2 IMPLANT
CATH THORACIC 36FR RT ANG (CATHETERS) ×2 IMPLANT
CLIP VESOCCLUDE MED 24/CT (CLIP) ×2 IMPLANT
CLIP VESOCCLUDE SM WIDE 24/CT (CLIP) ×4 IMPLANT
CRADLE DONUT ADULT HEAD (MISCELLANEOUS) ×2 IMPLANT
DERMABOND ADVANCED (GAUZE/BANDAGES/DRESSINGS) ×1
DERMABOND ADVANCED .7 DNX12 (GAUZE/BANDAGES/DRESSINGS) IMPLANT
DRAPE CARDIOVASCULAR INCISE (DRAPES) ×2
DRAPE SLUSH/WARMER DISC (DRAPES) ×2 IMPLANT
DRAPE SRG 135X102X78XABS (DRAPES) ×1 IMPLANT
DRSG COVADERM 4X14 (GAUZE/BANDAGES/DRESSINGS) ×2 IMPLANT
ELECT REM PT RETURN 9FT ADLT (ELECTROSURGICAL) ×4
ELECTRODE REM PT RTRN 9FT ADLT (ELECTROSURGICAL) ×2 IMPLANT
FELT TEFLON 1X6 (MISCELLANEOUS) ×4 IMPLANT
GAUZE SPONGE 4X4 12PLY STRL (GAUZE/BANDAGES/DRESSINGS) ×4 IMPLANT
GAUZE SPONGE 4X4 12PLY STRL LF (GAUZE/BANDAGES/DRESSINGS) ×1 IMPLANT
GLOVE BIOGEL PI IND STRL 6.5 (GLOVE) IMPLANT
GLOVE BIOGEL PI INDICATOR 6.5 (GLOVE) ×5
GLOVE SURG SIGNA 7.5 PF LTX (GLOVE) ×6 IMPLANT
GOWN STRL REUS W/ TWL LRG LVL3 (GOWN DISPOSABLE) ×4 IMPLANT
GOWN STRL REUS W/ TWL XL LVL3 (GOWN DISPOSABLE) ×2 IMPLANT
GOWN STRL REUS W/TWL LRG LVL3 (GOWN DISPOSABLE) ×8
GOWN STRL REUS W/TWL XL LVL3 (GOWN DISPOSABLE) ×4
HEMOSTAT POWDER SURGIFOAM 1G (HEMOSTASIS) ×6 IMPLANT
HEMOSTAT SURGICEL 2X14 (HEMOSTASIS) ×3 IMPLANT
INSERT FOGARTY XLG (MISCELLANEOUS) ×1 IMPLANT
KIT BASIN OR (CUSTOM PROCEDURE TRAY) ×2 IMPLANT
KIT ROOM TURNOVER OR (KITS) ×2 IMPLANT
KIT SUCTION CATH 14FR (SUCTIONS) ×4 IMPLANT
KIT VASOVIEW HEMOPRO VH 3000 (KITS) ×2 IMPLANT
MARKER GRAFT CORONARY BYPASS (MISCELLANEOUS) ×6 IMPLANT
NS IRRIG 1000ML POUR BTL (IV SOLUTION) ×10 IMPLANT
PACK OPEN HEART (CUSTOM PROCEDURE TRAY) ×2 IMPLANT
PAD ARMBOARD 7.5X6 YLW CONV (MISCELLANEOUS) ×4 IMPLANT
PAD ELECT DEFIB RADIOL ZOLL (MISCELLANEOUS) ×2 IMPLANT
PENCIL BUTTON HOLSTER BLD 10FT (ELECTRODE) ×2 IMPLANT
PUNCH AORTIC ROTATE  4.5MM 8IN (MISCELLANEOUS) ×1 IMPLANT
PUNCH AORTIC ROTATE 4.0MM (MISCELLANEOUS) IMPLANT
PUNCH AORTIC ROTATE 4.5MM 8IN (MISCELLANEOUS) IMPLANT
PUNCH AORTIC ROTATE 5MM 8IN (MISCELLANEOUS) IMPLANT
SET CARDIOPLEGIA MPS 5001102 (MISCELLANEOUS) ×1 IMPLANT
SPONGE LAP 18X18 X RAY DECT (DISPOSABLE) ×1 IMPLANT
SUT BONE WAX W31G (SUTURE) ×2 IMPLANT
SUT MNCRL AB 4-0 PS2 18 (SUTURE) IMPLANT
SUT PROLENE 3 0 SH DA (SUTURE) ×2 IMPLANT
SUT PROLENE 4 0 RB 1 (SUTURE) ×2
SUT PROLENE 4 0 SH DA (SUTURE) IMPLANT
SUT PROLENE 4-0 RB1 .5 CRCL 36 (SUTURE) IMPLANT
SUT PROLENE 6 0 C 1 30 (SUTURE) ×5 IMPLANT
SUT PROLENE 7 0 BV1 MDA (SUTURE) ×4 IMPLANT
SUT PROLENE 8 0 BV175 6 (SUTURE) IMPLANT
SUT STEEL 6MS V (SUTURE) ×2 IMPLANT
SUT STEEL STERNAL CCS#1 18IN (SUTURE) IMPLANT
SUT STEEL SZ 6 DBL 3X14 BALL (SUTURE) ×2 IMPLANT
SUT VIC AB 1 CTX 36 (SUTURE) ×4
SUT VIC AB 1 CTX36XBRD ANBCTR (SUTURE) ×2 IMPLANT
SUT VIC AB 2-0 CT1 27 (SUTURE) ×2
SUT VIC AB 2-0 CT1 TAPERPNT 27 (SUTURE) IMPLANT
SUT VIC AB 2-0 CTX 27 (SUTURE) ×1 IMPLANT
SUT VIC AB 3-0 SH 27 (SUTURE)
SUT VIC AB 3-0 SH 27X BRD (SUTURE) IMPLANT
SUT VIC AB 3-0 X1 27 (SUTURE) ×1 IMPLANT
SUT VICRYL 4-0 PS2 18IN ABS (SUTURE) IMPLANT
SUTURE E-PAK OPEN HEART (SUTURE) ×2 IMPLANT
SYSTEM SAHARA CHEST DRAIN ATS (WOUND CARE) ×2 IMPLANT
TOWEL GREEN STERILE (TOWEL DISPOSABLE) ×8 IMPLANT
TOWEL GREEN STERILE FF (TOWEL DISPOSABLE) ×4 IMPLANT
TOWEL OR 17X24 6PK STRL BLUE (TOWEL DISPOSABLE) ×2 IMPLANT
TOWEL OR 17X26 10 PK STRL BLUE (TOWEL DISPOSABLE) ×2 IMPLANT
TRAY FOLEY SILVER 16FR TEMP (SET/KITS/TRAYS/PACK) ×2 IMPLANT
TUBE FEEDING 8FR 16IN STR KANG (MISCELLANEOUS) ×2 IMPLANT
TUBING INSUFFLATION (TUBING) ×2 IMPLANT
UNDERPAD 30X30 (UNDERPADS AND DIAPERS) ×2 IMPLANT
WATER STERILE IRR 1000ML POUR (IV SOLUTION) ×4 IMPLANT

## 2017-04-09 NOTE — Progress Notes (Signed)
      [address]       [place] [address]             [phone]      Intubated, starting to wake up - following commands  BP 125/74   Pulse 88   Temp (!) 96.1 F (35.6 C)   Resp 16   Ht 5' 2.01" (1.575 m)   Wt 148 lb 12.8 oz (67.5 kg)   SpO2 100%   BMI 27.21 kg/m   CI= 2.5   Intake/Output Summary (Last 24 hours) at 04/09/2017 1747 Last data filed at 04/09/2017 1633 Gross per 24 hour  Intake 6684.9 ml  Output 6648 ml  Net 36.9 ml   UOP high- have been chasing with volume to keep BP up Wean to extubate  Remo Lipps C. Roxan Hockey, MD [facility] [phone]

## 2017-04-09 NOTE — Anesthesia Procedure Notes (Signed)
Arterial Line Insertion Start/End11/01/2017 7:05 AM, 04/09/2017 7:10 AM Performed by: Lowella Dell, CRNA, CRNA  Patient location: Pre-op. Preanesthetic checklist: patient identified, IV checked, site marked, risks and benefits discussed, surgical consent, monitors and equipment checked, pre-op evaluation and timeout performed Lidocaine 1% used for infiltration and patient sedated Left, radial was placed Catheter size: 20 G Hand hygiene performed , maximum sterile barriers used  and Seldinger technique used Allen's test indicative of satisfactory collateral circulation Attempts: 1 Procedure performed without using ultrasound guided technique. Following insertion, dressing applied and Biopatch. Post procedure assessment: normal  Patient tolerated the procedure well with no immediate complications.

## 2017-04-09 NOTE — Procedures (Signed)
Extubation Procedure Note  Patient Details:   Name: Christine [name] DOB: June 03, 1950 MRN: [id number]   Airway Documentation:     Evaluation  O2 sats: stable throughout and currently acceptable Complications: No apparent complications Patient did tolerate procedure well. Bilateral Breath Sounds: Clear   Yes   NIF -32, VC 667  Braxton Weisbecker T 04/09/2017, 6:30 PM

## 2017-04-09 NOTE — Interval H&P Note (Signed)
History and Physical Interval Note:  04/09/2017 7:26 AM  Christine [name]  has presented today for surgery, with the diagnosis of Coronary Artery Disease  The various methods of treatment have been discussed with the patient and family. After consideration of risks, benefits and other options for treatment, the patient has consented to  Procedure(s): CORONARY ARTERY BYPASS GRAFTING (CABG)x4,EVH,TEE (N/A) TRANSESOPHAGEAL ECHOCARDIOGRAM (TEE) (N/A) as a surgical intervention .  The patient's history has been reviewed, patient examined, no change in status, stable for surgery.  I have reviewed the patient's chart and labs.  Questions were answered to the patient's satisfaction.     Melrose Nakayama

## 2017-04-09 NOTE — Anesthesia Procedure Notes (Addendum)
Central Venous Catheter Insertion Performed by: Belinda Block, MD, anesthesiologist Start/End11/01/2017 6:55 AM, 04/09/2017 7:10 AM Preanesthetic checklist: patient identified, IV checked, site marked, risks and benefits discussed, surgical consent, monitors and equipment checked, pre-op evaluation and timeout performed Position: Trendelenburg Lidocaine 1% used for infiltration and patient sedated Hand hygiene performed , maximum sterile barriers used  and Seldinger technique used PA cath was placed.Sheath introducer Swan type:thermodilution Procedure performed using ultrasound guided technique. Ultrasound Notes:anatomy identified Attempts: 1 Following insertion, line sutured, dressing applied and Biopatch. Post procedure assessment: blood return through all ports  Patient tolerated the procedure well with no immediate complications.

## 2017-04-09 NOTE — Transfer of Care (Signed)
Immediate Anesthesia Transfer of Care Note  Patient: Christine [name]  Procedure(s) Performed: CORONARY ARTERY BYPASS GRAFTING (CABG)x5 USING RIGHT SAPHENOUS VEIN, HARVESTED ENDOSCOPICALLY AND LEFT INTERNAL MAMMARY ARTERY (N/A Chest) TRANSESOPHAGEAL ECHOCARDIOGRAM (TEE) (N/A Chest)  Patient Location: SICU  Anesthesia Type:General  Level of Consciousness: sedated and Patient remains intubated per anesthesia plan  Airway & Oxygen Therapy: Patient remains intubated per anesthesia plan and Patient placed on Ventilator (see vital sign flow sheet for setting)  Post-op Assessment: Report given to RN and Post -op Vital signs reviewed and stable  Post vital signs: Reviewed and stable  Last Vitals:  BP 105/58 HR 90 (DOO 90) RR see vent flowsheet for details PAP 28/11 SpO2 96% on 50%  Last Pain:  Vitals:   04/09/17 0607  TempSrc: Oral      Patients Stated Pain Goal: 3 (05/19/74 8832)  Complications: No apparent anesthesia complications

## 2017-04-09 NOTE — Progress Notes (Signed)
  Echocardiogram Echocardiogram Transesophageal has been performed.  Christine [name] 04/09/2017, 9:31 AM

## 2017-04-09 NOTE — Op Note (Signed)
Christine [name], ISOLA.:  [id number]  MEDICAL RECORD NO.:  [id number]  LOCATION:                                 FACILITY:  PHYSICIAN:  Revonda Standard. Roxan Hockey, M.D. DATE OF BIRTH:  DATE OF PROCEDURE:  04/09/2017 DATE OF DISCHARGE:                              OPERATIVE REPORT   PREOPERATIVE DIAGNOSIS:  Three-vessel coronary disease.  POSTOPERATIVE DIAGNOSIS:  Three-vessel coronary disease.  PROCEDURE:   Median sternotomy, extracorporeal circulation, Coronary artery bypass grafting x 5  Left internal mammary artery to left anterior descending artery,  Sequential saphenous vein graft to diagonal one branches,  Sequential saphenous vein graft to obtuse marginal and OM2 (PDA)), Endoscopic vein harvest, right leg.  SURGEON:  Revonda Standard. Roxan Hockey, M.D.  ASSISTANT:  Jadene Pierini, P.A.-C.  ANESTHESIA:  General.  FINDINGS:  Transesophageal echocardiography showed preserved left ventricular function with no significant valvular pathology.  LAD- intramyocardial good quality target.  Remaining targets- small poor quality vessels.  Good quality conduits. The patient is not a candidate for redo bypass grafting.  CLINICAL NOTE:  Christine [name] is a 66 year old woman, who presents with a 24-month history of exertional chest discomfort.  Her symptoms progressed.  The nuclear stress test was not consistent with significant ischemic changes, but due to her progression of symptoms, she underwent cardiac catheterization, which showed a left dominant circulation with ostial LAD and circumflex disease.  She was advised to undergo coronary artery bypass grafting.  The indications, risks, benefits, and alternatives were discussed in detail with the patient.  She understood and accepted the risks and agreed to proceed.  OPERATIVE NOTE:  Christine [name] was brought to the preoperative holding area on April 09, 2017.  Anesthesia placed a Swan-Ganz catheter and an arterial blood  pressure monitoring line.  She was taken to the operating room, anesthetized, and intubated.  A Foley catheter was placed. Intravenous antibiotics were administered.  The chest, abdomen, and legs were prepped and draped in usual sterile fashion.  After performing a time-out, a median sternotomy was performed and the left internal mammary artery was harvested using standard technique. 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  Simultaneously with the mammary harvest, an incision was made in the medial aspect of the right leg just below the knee.  The greater saphenous vein was identified and harvested from groin to mid calf endoscopically. Both the saphenous vein and mammary artery were good quality vessels.  2000 units of heparin  was administered during the vessel harvest.  The remainder of the full heparin dose was given after opening the pericardium.  After harvesting the conduits, the pericardium was opened.  Heparin was administered.  After confirming adequate anticoagulation with ACT measurement, the aorta was cannulated via concentric 2-0 Ethibond pledgeted pursestring sutures.  A dual-stage venous cannula was placed via a pursestring suture in right atrial appendage.  Cardiopulmonary bypass was initiated.  Flows were maintained per protocol.  The patient was cooled to 32 degrees Celsius.  The coronary arteries were inspected. The anterolateral and posterolateral branches were very difficult to identify.  Her coronaries were very small and intramyocardial.  The LAD was not identified at this  time, it was dissected out at a later point. The conduits were inspected and cut to length.  A foam pad was placed in the pericardium to insulate the heart.  A temperature probe was placed in the myocardial septum and a cardioplegia cannula was placed in the ascending aorta.  The aorta was crossclamped.  The left ventricle was emptied via the aortic root vent.  Cardiac arrest then was achieved with  a combination of cold antegrade blood cardioplegia and topical iced saline. Cardioplegia 1 L was administered.  There was a rapid diastolic arrest and septal cooling to 10 degrees Celsius.  A reversed saphenous vein graft was placed sequentially to obtuse marginals 1 and 2.  These were posterolateral branches.  The OM1 was a posterolateral and the OM2 was the left-sided posterior descending. These were both small vessels that were grafted relatively high in the AV groove.  They were both torturous, both accepted a 1-mm probe, and were thin walled, poor quality vessels.  A side-to-side anastomosis was performed to OM1 and an end-to-side to OM2.  Both were done with running 7-0 Prolene sutures.  All anastomoses were probed proximally and distally at their completion.  Cardioplegia was administered down the graft.  There was satisfactory flow and good hemostasis.  A reversed saphenous vein graft was placed end-to-side to the first diagonal.  This was a high anterolateral branch, essentially the dominant anterolateral branch.  It bifurcated and gave rise to 2 branches, the more proximal one accepted a 1-mm probe.  The more distal one accepted a 1.5-mm probe.  Both these vessels were intramyocardial. A side-to-side anastomosis was performed to the A branch and end-to-side to the B branch, both were done with running 7-0 Prolene sutures. Again, a probe passed easily proximally and distally at the completion of both anastomoses.  Cardioplegia was administered.  There was again satisfactory flow and there also was good hemostasis.  Additional cardioplegia was administered via the aortic root.  The left internal mammary artery was brought through a window in the pericardium. The distal end was beveled.  The LAD was dissected out.  It was intramyocardial.  It was a 1.5-mm good quality target vessel at the site of the anastomosis.  The mammary to LAD anastomosis was performed with a running 8-0  Prolene suture.  At completion of the mammary to LAD anastomosis, the bulldog clamp was removed.  Rapid septal rewarming was noted.  The bulldog clamp was replaced.  The mammary pedicle was tacked to the epicardial surface of the heart with 6-0 Prolene sutures. Additional cardioplegia was administered.  The cardioplegia cannula was removed from the ascending aorta.  The vein grafts were cut to length. The proximal vein graft anastomoses were performed to 4.5 mm punch aortotomies with running 6-0 Prolene sutures.  At the completion of the final proximal anastomosis, the patient was placed in Trendelenburg position.  Lidocaine was administered.  The aortic root was de-aired. The aortic crossclamp was removed.  The total crossclamp time was 89 minutes.  While rewarming was completed, all proximal and distal anastomoses were inspected for hemostasis.  The patient was fibrillating.  A single defibrillation with 10 joules resulted in resumption of sinus rhythm.  Epicardial pacing wires were placed on the right ventricle and right atrium.  When the patient had rewarmed to a core temperature of 37 degrees Celsius, she was weaned from cardiopulmonary bypass on the first attempt without difficulty.  The total bypass time was 121 minutes.  The initial cardiac index  was 1.9 L/min/m2.  The patient remained hemodynamically stable throughout the post-bypass period.  Post bypass transesophageal echocardiography was unchanged from the prebypass study.  There was preserved left ventricular function and no significant valvular pathology.  A test dose of protamine was administered and was well tolerated.  The atrial and aortic cannulae were removed.  The remainder of the protamine was administered without incident.  The chest was irrigated with warm saline.  Hemostasis was achieved.  Left pleural and mediastinal chest tubes were placed through separate subcostal incisions.  The pericardium was  reapproximated over the aortic root with interrupted 3-0 silk sutures.  It was not closed over the heart.  The sternum was closed with a combination of single and double heavy gauge stainless steel wires. The pectoralis fascia, subcutaneous tissue, and skin were closed in standard fashion.  All sponge, needle, and instrument counts were correct at the end of the procedure.  The patient was taken from the operating room to the surgical intensive care unit intubated and in good condition.  The patient is not a candidate for redo bypass grafting.     Revonda Standard Roxan Hockey, M.D.     SCH/MEDQ  D:  04/09/2017  T:  04/09/2017  Job:  [id number]

## 2017-04-09 NOTE — Anesthesia Postprocedure Evaluation (Signed)
Anesthesia Post Note  Patient: Christine [name]  Procedure(s) Performed: CORONARY ARTERY BYPASS GRAFTING (CABG)x5 USING RIGHT SAPHENOUS VEIN, HARVESTED ENDOSCOPICALLY AND LEFT INTERNAL MAMMARY ARTERY (N/A Chest) TRANSESOPHAGEAL ECHOCARDIOGRAM (TEE) (N/A Chest)     Patient location during evaluation: SICU Anesthesia Type: General Level of consciousness: patient remains intubated per anesthesia plan Pain management: pain level controlled Vital Signs Assessment: post-procedure vital signs reviewed and stable Respiratory status: patient re-intubated Cardiovascular status: stable Anesthetic complications: no    Last Vitals:  Vitals:   04/09/17 1445 04/09/17 1500  BP:    Pulse: 88 88  Resp: 16 16  Temp: (!) 35.4 C (!) 35.6 C  SpO2: 100% 100%    Last Pain:  Vitals:   04/09/17 1400  TempSrc: Core                 Pixie Burgener

## 2017-04-09 NOTE — Brief Op Note (Addendum)
04/09/2017  11:34 AM  PATIENT:  Christine [name]  66 y.o. female  PRE-OPERATIVE DIAGNOSIS:  Coronary Artery Disease  POST-OPERATIVE DIAGNOSIS:  Coronary Artery Disease  PROCEDURE:  Procedure(s): CORONARY ARTERY BYPASS GRAFTING (CABG)x5 USING RIGHT SAPHENOUS VEIN, HARVESTED ENDOSCOPICALLY AND LEFT INTERNAL MAMMARY ARTERY (N/A) TRANSESOPHAGEAL ECHOCARDIOGRAM (TEE) (N/A) [place] - LAD SVG-DIAG 1A - DIAG 1B SVG-OM1 - OM2  SURGEON:  Surgeon(s) and Role:    * Melrose Nakayama, MD - Primary  PHYSICIAN ASSISTANT: WAYNE GOLD PA-C  ANESTHESIA:   general  EBL:     BLOOD ADMINISTERED:Admit to inpatient   DRAINS: CHEST AMD MEDIASTINAL DRAINS   LOCAL MEDICATIONS USED:  NONE  SPECIMEN:  No Specimen  DISPOSITION OF SPECIMEN:  N/A  COUNTS:  YES  TOURNIQUET:  * No tourniquets in log *  DICTATION: .Other Dictation: Dictation Number [id number]  PLAN OF CARE: Admit to inpatient   PATIENT DISPOSITION:  ICU - intubated and hemodynamically stable.   Delay start of Pharmacological VTE agent (>24hrs) due to surgical blood loss or risk of bleeding: yes  COMPLICATIONS: NO KNOWN  XC= 89 min CPB= 121 min  LAD intramyocardial, good target. Remaining targets small poor quality

## 2017-04-09 NOTE — Anesthesia Procedure Notes (Signed)
Procedure Name: Intubation Date/Time: 04/09/2017 7:49 AM Performed by: Lowella Dell, CRNA Pre-anesthesia Checklist: Patient identified, Emergency Drugs available, Suction available and Patient being monitored Patient Re-evaluated:Patient Re-evaluated prior to induction Oxygen Delivery Method: Circle System Utilized Preoxygenation: Pre-oxygenation with 100% oxygen Induction Type: IV induction Ventilation: Mask ventilation without difficulty and Oral airway inserted - appropriate to patient size Laryngoscope Size: Mac and 3 Grade View: Grade III Tube type: Subglottic suction tube Tube size: 8.0 mm Number of attempts: 1 Airway Equipment and Method: Stylet and Oral airway Placement Confirmation: ETT inserted through vocal cords under direct vision,  positive ETCO2 and breath sounds checked- equal and bilateral Secured at: 22 cm Tube secured with: Tape Dental Injury: Teeth and Oropharynx as per pre-operative assessment

## 2017-04-09 NOTE — Anesthesia Preprocedure Evaluation (Addendum)
Anesthesia Evaluation  Patient identified by MRN, date of birth, ID band Patient awake    Reviewed: Allergy & Precautions, NPO status , Patient's Chart, lab work & pertinent test results  History of Anesthesia Complications (+) PSEUDOCHOLINESTERASE DEFICIENCY  Airway Mallampati: II  TM Distance: >3 FB     Dental   Pulmonary shortness of breath,    breath sounds clear to auscultation       Cardiovascular + angina + CAD   Rhythm:Regular Rate:Normal     Neuro/Psych    GI/Hepatic GERD  ,  Endo/Other    Renal/GU      Musculoskeletal   Abdominal   Peds  Hematology   Anesthesia Other Findings   Reproductive/Obstetrics                            Anesthesia Physical Anesthesia Plan  ASA: III  Anesthesia Plan: General   Post-op Pain Management:    Induction: Intravenous  PONV Risk Score and Plan: 3 and Treatment may vary due to age or medical condition, Ondansetron and Dexamethasone  Airway Management Planned: Oral ETT  Additional Equipment: TEE, PA Cath and Arterial line  Intra-op Plan:   Post-operative Plan: Post-operative intubation/ventilation  Informed Consent: I have reviewed the patients History and Physical, chart, labs and discussed the procedure including the risks, benefits and alternatives for the proposed anesthesia with the patient or authorized representative who has indicated his/her understanding and acceptance.   Dental advisory given  Plan Discussed with: CRNA, Anesthesiologist and Surgeon  Anesthesia Plan Comments:        Anesthesia Quick Evaluation

## 2017-04-10 ENCOUNTER — Encounter (HOSPITAL_COMMUNITY): Payer: Self-pay

## 2017-04-10 ENCOUNTER — Inpatient Hospital Stay (HOSPITAL_COMMUNITY): Payer: Medicare Other

## 2017-04-10 LAB — CBC
HCT: 29.4 % — ABNORMAL LOW (ref 36.0–46.0)
HEMATOCRIT: 28.2 % — AB (ref 36.0–46.0)
HEMOGLOBIN: 9.6 g/dL — AB (ref 12.0–15.0)
HEMOGLOBIN: 8.8 g/dL — AB (ref 12.0–15.0)
MCH: 28.3 pg (ref 26.0–34.0)
MCH: 27.7 pg (ref 26.0–34.0)
MCHC: 32.7 g/dL (ref 30.0–36.0)
MCHC: 31.2 g/dL (ref 30.0–36.0)
MCV: 86.7 fL (ref 78.0–100.0)
MCV: 88.7 fL (ref 78.0–100.0)
Platelets: 232 10*3/uL (ref 150–400)
Platelets: 163 10*3/uL (ref 150–400)
RBC: 3.39 MIL/uL — ABNORMAL LOW (ref 3.87–5.11)
RBC: 3.18 MIL/uL — ABNORMAL LOW (ref 3.87–5.11)
RDW: 13.2 % (ref 11.5–15.5)
RDW: 13.4 % (ref 11.5–15.5)
WBC: 16.5 10*3/uL — ABNORMAL HIGH (ref 4.0–10.5)
WBC: 12.5 10*3/uL — ABNORMAL HIGH (ref 4.0–10.5)

## 2017-04-10 LAB — CREATININE, SERUM
Creatinine, Ser: 0.94 mg/dL (ref 0.44–1.00)
GFR calc Af Amer: >60 mL/min (ref >60)

## 2017-04-10 LAB — GLUCOSE, CAPILLARY
GLUCOSE-CAPILLARY: 140 mg/dL — AB (ref 65–99)
GLUCOSE-CAPILLARY: 124 mg/dL — AB (ref 65–99)
Glucose-Capillary: 104 mg/dL — ABNORMAL HIGH (ref 65–99)
Glucose-Capillary: 140 mg/dL — ABNORMAL HIGH (ref 65–99)
Glucose-Capillary: 213 mg/dL — ABNORMAL HIGH (ref 65–99)
Glucose-Capillary: 234 mg/dL — ABNORMAL HIGH (ref 65–99)

## 2017-04-10 LAB — BASIC METABOLIC PANEL
Anion gap: 7 (ref 5–15)
BUN: 7 mg/dL (ref 6–20)
CHLORIDE: 107 mmol/L (ref 101–111)
CO2: 24 mmol/L (ref 22–32)
CREATININE: 0.7 mg/dL (ref 0.44–1.00)
Calcium: 8.7 mg/dL — ABNORMAL LOW (ref 8.9–10.3)
GFR calc Af Amer: >60 mL/min (ref >60)
GFR calc non Af Amer: >60 mL/min (ref >60)
GLUCOSE: 138 mg/dL — AB (ref 65–99)
POTASSIUM: 4.4 mmol/L (ref 3.5–5.1)
SODIUM: 138 mmol/L (ref 135–145)

## 2017-04-10 LAB — MAGNESIUM
MAGNESIUM: 2.3 mg/dL (ref 1.7–2.4)
MAGNESIUM: 2.1 mg/dL (ref 1.7–2.4)

## 2017-04-10 MED ORDER — METOCLOPRAMIDE HCL 5 MG/ML IJ SOLN
10.0000 mg | Freq: Four times a day (QID) | INTRAMUSCULAR | Status: AC
  Administered 2017-04-10 – 2017-04-11 (×4): 10 mg via INTRAVENOUS
  Filled 2017-04-10 (×4): qty 2

## 2017-04-10 MED ORDER — ENOXAPARIN SODIUM 40 MG/0.4ML ~~LOC~~ SOLN
40.0000 mg | Freq: Every day | SUBCUTANEOUS | Status: DC
  Administered 2017-04-10 – 2017-04-13 (×4): 40 mg via SUBCUTANEOUS
  Filled 2017-04-10 (×4): qty 0.4

## 2017-04-10 MED ORDER — INSULIN ASPART 100 UNIT/ML ~~LOC~~ SOLN
0.0000 [IU] | SUBCUTANEOUS | Status: DC
  Administered 2017-04-10: 8 [IU] via SUBCUTANEOUS
  Administered 2017-04-10: 2 [IU] via SUBCUTANEOUS
  Administered 2017-04-10: 8 [IU] via SUBCUTANEOUS
  Administered 2017-04-11: 2 [IU] via SUBCUTANEOUS

## 2017-04-10 MED ORDER — INSULIN ASPART 100 UNIT/ML ~~LOC~~ SOLN
0.0000 [IU] | SUBCUTANEOUS | Status: DC
  Administered 2017-04-10: 2 [IU] via SUBCUTANEOUS

## 2017-04-10 MED ORDER — PROMETHAZINE HCL 25 MG/ML IJ SOLN
6.2500 mg | Freq: Four times a day (QID) | INTRAMUSCULAR | Status: DC | PRN
  Administered 2017-04-10: 6.25 mg via INTRAVENOUS
  Filled 2017-04-10: qty 1

## 2017-04-10 NOTE — Progress Notes (Signed)
[name] made rounds and personally instructed me to D/C R IJ Sleeve as it is leaking and inaccessible. Also instructed me to D/C Dopamine as we no longer have central access, and to instead give volume PRN. Will carry out orders and continue to monitor the patient closely.  Christine [name]

## 2017-04-10 NOTE — Progress Notes (Signed)
1 Day Post-Op Procedure(s) (LRB): CORONARY ARTERY BYPASS GRAFTING (CABG)x5 USING RIGHT SAPHENOUS VEIN, HARVESTED ENDOSCOPICALLY AND LEFT INTERNAL MAMMARY ARTERY (N/A) TRANSESOPHAGEAL ECHOCARDIOGRAM (TEE) (N/A) Subjective: C/o nausea, some incisional pain as well  Objective: Vital signs in last 24 hours: Temp:  [93.9 F (34.4 C)-100.2 F (37.9 C)] 99.7 F (37.6 C) (11/10 1000) Pulse Rate:  [80-99] 90 (11/10 1000) Cardiac Rhythm: Normal sinus rhythm (11/10 0800) Resp:  [10-30] 18 (11/10 1000) BP: (96-150)/(61-139) 108/68 (11/10 1000) SpO2:  [94 %-100 %] 98 % (11/10 1000) Arterial Line BP: (66-153)/(38-71) 123/53 (11/10 1000) FiO2 (%):  [40 %-50 %] 50 % (11/09 1743) Weight:  [162 lb 11.2 oz (73.8 kg)] 162 lb 11.2 oz (73.8 kg) (11/10 0415)  Hemodynamic parameters for last 24 hours: PAP: (21-49)/(8-30) 33/13 CO:  [2.3 L/min-5 L/min] 3.7 L/min CI:  [1.6 L/min/m2-3 L/min/m2] 2.2 L/min/m2  Intake/Output from previous day: 11/09 0701 - 11/10 0700 In: 8362.1 [I.V.:5652.1; Blood:160; IV Piggyback:2550] Out: 17001 [Urine:6325; Blood:650; Chest Tube:490] Intake/Output this shift: Total I/O In: 374.1 [I.V.:324.1; IV Piggyback:50] Out: 330 [Urine:250; Chest Tube:80]  General appearance: alert, cooperative and no distress Neurologic: intact Heart: regular rate and rhythm Lungs: diminished breath sounds bibasilar Abdomen: normal findings: soft, non-tender  Lab Results: Recent Labs    04/09/17 1914 04/10/17 0259  WBC 17.0* 16.5*  HGB 9.0* 9.6*  HCT 27.5* 29.4*  PLT 193 232   BMET:  Recent Labs    04/07/17 1200  04/09/17 1907 04/09/17 1914 04/10/17 0259  NA 134*   < > 145  --  138  K 4.2   < > 4.5  --  4.4  CL 104   < > 108  --  107  CO2 24  --   --   --  24  GLUCOSE 169*   < > 139*  --  138*  BUN 12   < > 8  --  7  CREATININE 0.70   < > 0.50 0.69 0.70  CALCIUM 9.5  --   --   --  8.7*   < > = values in this interval not displayed.    PT/INR:  Recent Labs     04/09/17 1312  LABPROT 16.9*  INR 1.39   ABG    Component Value Date/Time   PHART 7.347 (L) 04/09/2017 1934   HCO3 25.2 04/09/2017 1934   TCO2 27 04/09/2017 1934   ACIDBASEDEF 6.0 (H) 04/09/2017 1620   O2SAT 96.0 04/09/2017 1934   CBG (last 3)  Recent Labs    04/09/17 2355 04/10/17 0311 04/10/17 0840  GLUCAP 104* 140* 124*    Assessment/Plan: S/P Procedure(s) (LRB): CORONARY ARTERY BYPASS GRAFTING (CABG)x5 USING RIGHT SAPHENOUS VEIN, HARVESTED ENDOSCOPICALLY AND LEFT INTERNAL MAMMARY ARTERY (N/A) TRANSESOPHAGEAL ECHOCARDIOGRAM (TEE) (N/A) -  CV- s/p CABG, good cardiac output- dc swan and A line  Wean neo then dopamine as BP allows  ASA, statin, beta blocker  RESP- IS for atelectasis  RENAL- creatinine and lytes OK  Will need diuresis but hold off for now as neo weaned  GI- nausea, continue zofran, reglan x 24 hours  ENDO_ CBG well controlled  Anemia secondary to ABL- mild, follow  DC chest tubes  Mobilize     LOS: 1 day    Melrose Nakayama 04/10/2017

## 2017-04-10 NOTE — Progress Notes (Signed)
      [address]       [place] [address]             [phone]      Up in chair Nausea better but phenergan caused sedation  BP (!) 88/55   Pulse 83   Temp 98.1 F (36.7 C) (Oral)   Resp 15   Ht 5' 2.01" (1.575 m)   Wt 162 lb 11.2 oz (73.8 kg)   SpO2 94%   BMI 29.75 kg/m    Intake/Output Summary (Last 24 hours) at 04/10/2017 1801 Last data filed at 04/10/2017 1400 Gross per 24 hour  Intake 2212.94 ml  Output 4010 ml  Net -1797.06 ml    Continue dopamine for now as BP still relatively low  Remo Lipps C. Roxan Hockey, MD [facility] [phone]

## 2017-04-11 ENCOUNTER — Inpatient Hospital Stay (HOSPITAL_COMMUNITY): Payer: Medicare Other

## 2017-04-11 LAB — BASIC METABOLIC PANEL
Anion gap: 6 (ref 5–15)
BUN: 15 mg/dL (ref 6–20)
CHLORIDE: 105 mmol/L (ref 101–111)
CO2: 29 mmol/L (ref 22–32)
CREATININE: 0.87 mg/dL (ref 0.44–1.00)
Calcium: 8.6 mg/dL — ABNORMAL LOW (ref 8.9–10.3)
GFR calc Af Amer: >60 mL/min (ref >60)
GFR calc non Af Amer: >60 mL/min (ref >60)
Glucose, Bld: 101 mg/dL — ABNORMAL HIGH (ref 65–99)
Potassium: 3.8 mmol/L (ref 3.5–5.1)
Sodium: 140 mmol/L (ref 135–145)

## 2017-04-11 LAB — CBC
HCT: 25.5 % — ABNORMAL LOW (ref 36.0–46.0)
Hemoglobin: 8.2 g/dL — ABNORMAL LOW (ref 12.0–15.0)
MCH: 28.6 pg (ref 26.0–34.0)
MCHC: 32.2 g/dL (ref 30.0–36.0)
MCV: 88.9 fL (ref 78.0–100.0)
PLATELETS: 149 10*3/uL — AB (ref 150–400)
RBC: 2.87 MIL/uL — AB (ref 3.87–5.11)
RDW: 13.8 % (ref 11.5–15.5)
WBC: 12.8 10*3/uL — ABNORMAL HIGH (ref 4.0–10.5)

## 2017-04-11 LAB — GLUCOSE, CAPILLARY
GLUCOSE-CAPILLARY: 145 mg/dL — AB (ref 65–99)
GLUCOSE-CAPILLARY: 215 mg/dL — AB (ref 65–99)
Glucose-Capillary: 87 mg/dL (ref 65–99)
Glucose-Capillary: 216 mg/dL — ABNORMAL HIGH (ref 65–99)
Glucose-Capillary: 200 mg/dL — ABNORMAL HIGH (ref 65–99)

## 2017-04-11 MED ORDER — ALBUMIN HUMAN 5 % IV SOLN
12.5000 g | Freq: Once | INTRAVENOUS | Status: AC
  Administered 2017-04-11: 12.5 g via INTRAVENOUS
  Filled 2017-04-11: qty 250

## 2017-04-11 MED ORDER — INSULIN ASPART 100 UNIT/ML ~~LOC~~ SOLN
0.0000 [IU] | Freq: Three times a day (TID) | SUBCUTANEOUS | Status: DC
  Administered 2017-04-11 (×2): 5 [IU] via SUBCUTANEOUS
  Administered 2017-04-12 – 2017-04-13 (×6): 3 [IU] via SUBCUTANEOUS
  Administered 2017-04-14: 2 [IU] via SUBCUTANEOUS
  Administered 2017-04-14: 3 [IU] via SUBCUTANEOUS

## 2017-04-11 NOTE — Progress Notes (Signed)
Pt lung sounds diminished. Discussed importance of IS use and deep breathing. Pt only able to pull 257mL on IS and despite repeated education took short, shallow breaths. Educated on flutter valve.

## 2017-04-11 NOTE — Progress Notes (Signed)
      [address]       [place] [address]             [phone]      Just back from walk, had emesis x 1 earlier but now feels better  BP (!) 155/78   Pulse 77   Temp 98 F (36.7 C) (Oral)   Resp 16   Ht 5' 2.01" (1.575 m)   Wt 162 lb 4.1 oz (73.6 kg)   SpO2 95%   BMI 29.67 kg/m    Intake/Output Summary (Last 24 hours) at 04/11/2017 1802 Last data filed at 04/11/2017 1700 Gross per 24 hour  Intake 1476.65 ml  Output 355 ml  Net 1121.65 ml   Doing well POD # 2  Remo Lipps C. Roxan Hockey, MD [facility] [phone]

## 2017-04-11 NOTE — Progress Notes (Signed)
[name] paged regarding patient's asymptomatic hypotension with systolics in the 16Y. Received orders to administer 1 albumin. Systolics in the 29I are acceptable per MD.  Will carry out orders.  Inis Sizer

## 2017-04-11 NOTE — Progress Notes (Signed)
2 Days Post-Op Procedure(s) (LRB): CORONARY ARTERY BYPASS GRAFTING (CABG)x5 USING RIGHT SAPHENOUS VEIN, HARVESTED ENDOSCOPICALLY AND LEFT INTERNAL MAMMARY ARTERY (N/A) TRANSESOPHAGEAL ECHOCARDIOGRAM (TEE) (N/A) Subjective: Feels better this AM, just back from a walk  Objective: Vital signs in last 24 hours: Temp:  [97.7 F (36.5 C)-99.7 F (37.6 C)] 99.2 F (37.3 C) (11/11 0837) Pulse Rate:  [80-96] 96 (11/11 0900) Cardiac Rhythm: Normal sinus rhythm (11/11 0800) Resp:  [11-23] 15 (11/11 0900) BP: (77-118)/(46-75) 109/60 (11/11 0900) SpO2:  [13 %-100 %] 96 % (11/11 0900) Arterial Line BP: (123)/(53) 123/53 (11/10 1000) Weight:  [162 lb 4.1 oz (73.6 kg)] 162 lb 4.1 oz (73.6 kg) (11/11 0530)  Hemodynamic parameters for last 24 hours: PAP: (33)/(13) 33/13  Intake/Output from previous day: 11/10 0701 - 11/11 0700 In: 1837.6 [P.O.:600; I.V.:887.6; IV Piggyback:350] Out: 890 [Urine:790; Chest Tube:100] Intake/Output this shift: Total I/O In: 90 [I.V.:40; IV Piggyback:50] Out: 60 [Urine:60]  General appearance: alert, cooperative and no distress Neurologic: intact Heart: regular rate and rhythm Lungs: diminished breath sounds bibasilar Abdomen: normal findings: no scars, striae, dilated veins, rashes, or lesions  Lab Results: Recent Labs    04/10/17 1923 04/11/17 0229  WBC 12.5* 12.8*  HGB 8.8* 8.2*  HCT 28.2* 25.5*  PLT 163 149*   BMET:  Recent Labs    04/10/17 0259 04/10/17 1923 04/11/17 0229  NA 138  --  140  K 4.4  --  3.8  CL 107  --  105  CO2 24  --  29  GLUCOSE 138*  --  101*  BUN 7  --  15  CREATININE 0.70 0.94 0.87  CALCIUM 8.7*  --  8.6*    PT/INR:  Recent Labs    04/09/17 1312  LABPROT 16.9*  INR 1.39   ABG    Component Value Date/Time   PHART 7.347 (L) 04/09/2017 1934   HCO3 25.2 04/09/2017 1934   TCO2 27 04/09/2017 1934   ACIDBASEDEF 6.0 (H) 04/09/2017 1620   O2SAT 96.0 04/09/2017 1934   CBG (last 3)  Recent Labs    04/10/17 2344  04/11/17 0327 04/11/17 0839  GLUCAP 140* 87 145*    Assessment/Plan: S/P Procedure(s) (LRB): CORONARY ARTERY BYPASS GRAFTING (CABG)x5 USING RIGHT SAPHENOUS VEIN, HARVESTED ENDOSCOPICALLY AND LEFT INTERNAL MAMMARY ARTERY (N/A) TRANSESOPHAGEAL ECHOCARDIOGRAM (TEE) (N/A) -  CV- POD # 2 CABG  In SR  BP still relatively low but no significant change since off dopamine  RESP- Bibasilar atelctasis- continue IS, add flutter  RENAL- creatinine normal  UOP relatively low- will need diuresis when BP consistently higher  ENDO- CBG widely variable, change to AC and HS  SCD + enoxaparin for DVT prophylaxis  Ambulated around entire unit this AM   LOS: 2 days    Melrose Nakayama 04/11/2017

## 2017-04-12 ENCOUNTER — Inpatient Hospital Stay (HOSPITAL_COMMUNITY): Payer: Medicare Other

## 2017-04-12 ENCOUNTER — Encounter (HOSPITAL_COMMUNITY): Payer: Self-pay | Admitting: Thoracic Surgery (Cardiothoracic Vascular Surgery)

## 2017-04-12 LAB — BASIC METABOLIC PANEL
ANION GAP: 6 (ref 5–15)
BUN: 17 mg/dL (ref 6–20)
CALCIUM: 8.5 mg/dL — AB (ref 8.9–10.3)
CO2: 29 mmol/L (ref 22–32)
Chloride: 100 mmol/L — ABNORMAL LOW (ref 101–111)
Creatinine, Ser: 0.89 mg/dL (ref 0.44–1.00)
GFR calc Af Amer: >60 mL/min (ref >60)
GLUCOSE: 163 mg/dL — AB (ref 65–99)
Potassium: 4.4 mmol/L (ref 3.5–5.1)
Sodium: 135 mmol/L (ref 135–145)

## 2017-04-12 LAB — BPAM RBC
BLOOD PRODUCT EXPIRATION DATE: 201812012359
BLOOD PRODUCT EXPIRATION DATE: 201812022359
Blood Product Expiration Date: 201811302359
Blood Product Expiration Date: 201812012359
ISSUE DATE / TIME: 201811061705
ISSUE DATE / TIME: 201811090839
ISSUE DATE / TIME: 201811120000
UNIT TYPE AND RH: 5100
UNIT TYPE AND RH: 5100
Unit Type and Rh: 5100
Unit Type and Rh: 5100

## 2017-04-12 LAB — GLUCOSE, CAPILLARY
GLUCOSE-CAPILLARY: 152 mg/dL — AB (ref 65–99)
GLUCOSE-CAPILLARY: 167 mg/dL — AB (ref 65–99)
Glucose-Capillary: 94 mg/dL (ref 65–99)
Glucose-Capillary: 197 mg/dL — ABNORMAL HIGH (ref 65–99)
Glucose-Capillary: 165 mg/dL — ABNORMAL HIGH (ref 65–99)

## 2017-04-12 LAB — TYPE AND SCREEN
ABO/RH(D): O POS
ANTIBODY SCREEN: NEGATIVE
UNIT DIVISION: 0
UNIT DIVISION: 0
Unit division: 0
Unit division: 0

## 2017-04-12 LAB — CBC
HEMATOCRIT: 27.7 % — AB (ref 36.0–46.0)
Hemoglobin: 8.7 g/dL — ABNORMAL LOW (ref 12.0–15.0)
MCH: 27.9 pg (ref 26.0–34.0)
MCHC: 31.4 g/dL (ref 30.0–36.0)
MCV: 88.8 fL (ref 78.0–100.0)
PLATELETS: 194 10*3/uL (ref 150–400)
RBC: 3.12 MIL/uL — ABNORMAL LOW (ref 3.87–5.11)
RDW: 13.8 % (ref 11.5–15.5)
WBC: 12 10*3/uL — ABNORMAL HIGH (ref 4.0–10.5)

## 2017-04-12 MED ORDER — MOVING RIGHT ALONG BOOK
Freq: Once | Status: AC
  Administered 2017-04-12: 18:00:00
  Filled 2017-04-12: qty 1

## 2017-04-12 MED ORDER — MAGNESIUM HYDROXIDE 400 MG/5ML PO SUSP: 30.0000 mL | Freq: Every day | ORAL | Status: DC | PRN

## 2017-04-12 MED ORDER — PANTOPRAZOLE SODIUM 40 MG PO TBEC
40.0000 mg | DELAYED_RELEASE_TABLET | Freq: Every day | ORAL | Status: DC
  Administered 2017-04-12 – 2017-04-14 (×3): 40 mg via ORAL
  Filled 2017-04-12 (×3): qty 1

## 2017-04-12 MED ORDER — SODIUM CHLORIDE 0.9% FLUSH: 3.0000 mL | INTRAVENOUS | Status: DC | PRN

## 2017-04-12 MED ORDER — SODIUM CHLORIDE 0.9% FLUSH
3.0000 mL | Freq: Two times a day (BID) | INTRAVENOUS | Status: DC
  Administered 2017-04-12 – 2017-04-14 (×4): 3 mL via INTRAVENOUS

## 2017-04-12 MED ORDER — POTASSIUM CHLORIDE CRYS ER 20 MEQ PO TBCR
20.0000 meq | EXTENDED_RELEASE_TABLET | Freq: Every day | ORAL | Status: DC
  Administered 2017-04-12 – 2017-04-14 (×3): 20 meq via ORAL
  Filled 2017-04-12 (×3): qty 1
  Filled 2017-04-12: qty 2

## 2017-04-12 MED ORDER — SODIUM CHLORIDE 0.9 % IV SOLN: 250.0000 mL | INTRAVENOUS | Status: DC | PRN

## 2017-04-12 MED ORDER — METOPROLOL SUCCINATE ER 25 MG PO TB24
25.0000 mg | ORAL_TABLET | Freq: Every day | ORAL | Status: DC
  Administered 2017-04-12 – 2017-04-14 (×3): 25 mg via ORAL
  Filled 2017-04-12 (×3): qty 1

## 2017-04-12 MED ORDER — FUROSEMIDE 40 MG PO TABS
40.0000 mg | ORAL_TABLET | Freq: Every day | ORAL | Status: DC
  Administered 2017-04-13 – 2017-04-14 (×2): 40 mg via ORAL
  Filled 2017-04-12 (×2): qty 1

## 2017-04-12 MED ORDER — ZOLPIDEM TARTRATE 5 MG PO TABS: 5.0000 mg | ORAL_TABLET | Freq: Every evening | ORAL | Status: DC | PRN

## 2017-04-12 MED ORDER — PHENOL 1.4 % MT LIQD
1.0000 | OROMUCOSAL | Status: DC | PRN
  Administered 2017-04-12: 1 via OROMUCOSAL
  Filled 2017-04-12: qty 177

## 2017-04-12 MED ORDER — FUROSEMIDE 10 MG/ML IJ SOLN
40.0000 mg | Freq: Once | INTRAMUSCULAR | Status: AC
  Administered 2017-04-12: 40 mg via INTRAVENOUS
  Filled 2017-04-12: qty 4

## 2017-04-12 NOTE — Progress Notes (Signed)
Patient ambulated 321ft with a walker . State she did ambulate similar distance this morning. Patient is very stable and with daughter  At bedside. Report called to [place] , patient is being transfer to [place] signs stable

## 2017-04-12 NOTE — Progress Notes (Signed)
3 Days Post-Op Procedure(s) (LRB): CORONARY ARTERY BYPASS GRAFTING (CABG)x5 USING RIGHT SAPHENOUS VEIN, HARVESTED ENDOSCOPICALLY AND LEFT INTERNAL MAMMARY ARTERY (N/A) TRANSESOPHAGEAL ECHOCARDIOGRAM (TEE) (N/A) Subjective: C/o sore throat, some incisional pain and nausea  Objective: Vital signs in last 24 hours: Temp:  [98 F (36.7 C)-99.2 F (37.3 C)] 98.8 F (37.1 C) (11/12 0327) Pulse Rate:  [75-98] 94 (11/12 0700) Cardiac Rhythm: Normal sinus rhythm (11/12 0400) Resp:  [11-31] 15 (11/12 0700) BP: (93-155)/(60-87) 117/60 (11/12 0700) SpO2:  [13 %-99 %] 96 % (11/12 0700) Weight:  [164 lb 3.9 oz (74.5 kg)] 164 lb 3.9 oz (74.5 kg) (11/12 0500)  Hemodynamic parameters for last 24 hours:    Intake/Output from previous day: 11/11 0701 - 11/12 0700 In: 1250 [P.O.:740; I.V.:460; IV Piggyback:50] Out: 430 [Urine:430] Intake/Output this shift: No intake/output data recorded.  General appearance: alert, cooperative and no distress Neurologic: intact Heart: regular rate and rhythm Lungs: improved BS in bases Extremities: edema 2+  Lab Results: Recent Labs    04/11/17 0229 04/12/17 0221  WBC 12.8* 12.0*  HGB 8.2* 8.7*  HCT 25.5* 27.7*  PLT 149* 194   BMET:  Recent Labs    04/11/17 0229 04/12/17 0221  NA 140 135  K 3.8 4.4  CL 105 100*  CO2 29 29  GLUCOSE 101* 163*  BUN 15 17  CREATININE 0.87 0.89  CALCIUM 8.6* 8.5*    PT/INR:  Recent Labs    04/09/17 1312  LABPROT 16.9*  INR 1.39   ABG    Component Value Date/Time   PHART 7.347 (L) 04/09/2017 1934   HCO3 25.2 04/09/2017 1934   TCO2 27 04/09/2017 1934   ACIDBASEDEF 6.0 (H) 04/09/2017 1620   O2SAT 96.0 04/09/2017 1934   CBG (last 3)  Recent Labs    04/11/17 1240 04/11/17 1717 04/11/17 2100  GLUCAP 216* 215* 200*    Assessment/Plan: S/P Procedure(s) (LRB): CORONARY ARTERY BYPASS GRAFTING (CABG)x5 USING RIGHT SAPHENOUS VEIN, HARVESTED ENDOSCOPICALLY AND LEFT INTERNAL MAMMARY ARTERY  (N/A) TRANSESOPHAGEAL ECHOCARDIOGRAM (TEE) (N/A) Plan for transfer to step-down: see transfer orders  CV- remains in SR. BP has improved  Continue ASA, beta blocker and statin  RESP- improved bibasilar atelectasis  RENAL- creatinine and lytes OK  Volume overloaded- diurese  ENDO- CBG elevated, A1C was 9- may need oral med  Will follow for now and continue SSI  SCD + enoxaparin   Cardiac rehab   LOS: 3 days    Melrose Nakayama 04/12/2017

## 2017-04-13 DIAGNOSIS — E119 Type 2 diabetes mellitus without complications: Secondary | ICD-10-CM

## 2017-04-13 LAB — CBC
HCT: 26.5 % — ABNORMAL LOW (ref 36.0–46.0)
Hemoglobin: 8.4 g/dL — ABNORMAL LOW (ref 12.0–15.0)
MCH: 28.7 pg (ref 26.0–34.0)
MCHC: 31.7 g/dL (ref 30.0–36.0)
MCV: 90.4 fL (ref 78.0–100.0)
PLATELETS: 253 10*3/uL (ref 150–400)
RBC: 2.93 MIL/uL — ABNORMAL LOW (ref 3.87–5.11)
RDW: 14.1 % (ref 11.5–15.5)
WBC: 11.2 10*3/uL — AB (ref 4.0–10.5)

## 2017-04-13 LAB — BASIC METABOLIC PANEL
ANION GAP: 9 (ref 5–15)
BUN: 14 mg/dL (ref 6–20)
CO2: 28 mmol/L (ref 22–32)
Calcium: 8.6 mg/dL — ABNORMAL LOW (ref 8.9–10.3)
Chloride: 99 mmol/L — ABNORMAL LOW (ref 101–111)
Creatinine, Ser: 0.94 mg/dL (ref 0.44–1.00)
GLUCOSE: 153 mg/dL — AB (ref 65–99)
Potassium: 4.3 mmol/L (ref 3.5–5.1)
Sodium: 136 mmol/L (ref 135–145)

## 2017-04-13 LAB — GLUCOSE, CAPILLARY
GLUCOSE-CAPILLARY: 178 mg/dL — AB (ref 65–99)
Glucose-Capillary: 166 mg/dL — ABNORMAL HIGH (ref 65–99)
Glucose-Capillary: 154 mg/dL — ABNORMAL HIGH (ref 65–99)
Glucose-Capillary: 142 mg/dL — ABNORMAL HIGH (ref 65–99)

## 2017-04-13 LAB — TSH: TSH: 2.784 u[IU]/mL (ref 0.350–4.500)

## 2017-04-13 MED ORDER — LIVING WELL WITH DIABETES BOOK
Freq: Once | Status: AC
  Administered 2017-04-13: 16:00:00
  Filled 2017-04-13: qty 1

## 2017-04-13 MED ORDER — METFORMIN HCL 500 MG PO TABS
500.0000 mg | ORAL_TABLET | Freq: Two times a day (BID) | ORAL | Status: DC
  Administered 2017-04-13 – 2017-04-14 (×4): 500 mg via ORAL
  Filled 2017-04-13 (×4): qty 1

## 2017-04-13 MED FILL — Lidocaine HCl IV Inj 20 MG/ML: INTRAVENOUS | Qty: 5 | Status: AC

## 2017-04-13 MED FILL — Heparin Sodium (Porcine) Inj 1000 Unit/ML: INTRAMUSCULAR | Qty: 10 | Status: AC

## 2017-04-13 MED FILL — Sodium Chloride IV Soln 0.9%: INTRAVENOUS | Qty: 2000 | Status: AC

## 2017-04-13 MED FILL — Mannitol IV Soln 20%: INTRAVENOUS | Qty: 500 | Status: AC

## 2017-04-13 MED FILL — Electrolyte-R (PH 7.4) Solution: INTRAVENOUS | Qty: 5000 | Status: AC

## 2017-04-13 MED FILL — Sodium Bicarbonate IV Soln 8.4%: INTRAVENOUS | Qty: 50 | Status: AC

## 2017-04-13 NOTE — Discharge Summary (Signed)
Physician Discharge Summary  Patient ID: Christine [name] MRN: [id number] DOB/AGE: January 04, 1951 66 y.o.  Admit date: 04/09/2017 Discharge date: 04/14/2017  Admission Diagnoses: Severe multivessel coronary artery disease  Discharge Diagnoses:  Active Problems:   S/P CABG x 5   Diabetes [facility])  Patient Active Problem List   Diagnosis Date Noted  . Diabetes ([place]) 04/13/2017  . S/P CABG x 5 04/09/2017  . GERD (gastroesophageal reflux disease) 04/05/2017  . CAD, multiple vessel   . Angina pectoris ([place])   . Chest pain 03/31/2017  . H/O echocardiogram 06/01/1988     HPI: Christine Christine [name] is sent for consultation regarding coronary artery disease.  Christine. Christine [name] is a 66 year old woman with a past medical history significant for gastroesophageal reflux.  She has no known history of hypertension or hyperlipidemia.  She says she has been told she was prediabetic.  She is a lifelong non-smoker.  The past 6 months she has been having exertional chest discomfort.  She says the pain is mostly in the back of her throat and it feels like somebody has punched her there.  It is only occurred with exertion she has not had any rest or nocturnal symptoms.  She has noted that has progressively worsened and become more frequent with less exertion.  She has a prescription for nitroglycerin but has not had to use it as the pain is always resolved with rest.  She saw Dr. Shelia Media and was referred to Dr. Virgina Jock.  A nuclear stress test showed some breast attenuation but no significant ischemic changes.  As her symptoms were progressing was recommended she undergo cardiac catheterization.  She had cardiac catheterization on 04/01/2017.  It showed a left dominant circulation.  There was significant ostial LAD and circumflex disease and diffuse disease beyond that.  Patient was seen in cardiothoracic surgical consultation by Dr. Roxan Hockey who evaluated the patient and studies and agree with recommendations to proceed  with CABG.  The patient was admitted for the procedure.  Discharged Condition: good  Hospital Course: The patient was admitted electively and taken to the operating room on 11 9 where she underwent the below described procedure.  She tolerated it well and was taken to the surgical intensive care unit in stable condition.  Postoperative hospital course:  Overall the patient has progressed nicely.  She was weaned from the ventilator using standard protocols without difficulty.  She did require some early inotropic support with dopamine and also required Neo-Synephrine for a short-term.  These were weaned over time with out difficulty.  The patient does have an acute blood loss anemia and most recent hemoglobin/hematocrit is8/26Renal function has remained normal and most recent BUN/creatinine is 14/0.94.  She does have some postoperative volume overload but has responded to Lasix.  She is noted to be an undiagnosed type II diabetic with a hemoglobin A1c of   9.0.  She is been seen by the diabetic coordinator and has also been started on metformin.  We have discussed dietary management as well.  All routine lines, monitors and drainage in the standard fashion.  Incisions are noted to be healing well without.  She is tolerating gradually increasing activities using routine cardiac rehab phase 1 modalities.  She has maintained normal sinus rhythm without any significant cardiac dysrhythmias.  She has had some significant postoperative nausea with some vomiting which is felt to be multifactorial and has improved over time with usual measures. GI dis see and F/U is arranged  At time of discharge the patient  is felt to be quite stable.     Consults: Diabetes coordinator, Gastroenterology  Significant Diagnostic Studies: routine post op labs and CXR's  Treatments: surgery:  PHYSICIAN:  Remo Lipps C. Roxan Hockey, M.D. DATE OF BIRTH:  DATE OF PROCEDURE:  04/09/2017 DATE OF DISCHARGE:                               OPERATIVE REPORT   PREOPERATIVE DIAGNOSIS:  Three-vessel coronary disease.  POSTOPERATIVE DIAGNOSIS:  Three-vessel coronary disease.  PROCEDURE:  Median sternotomy, extracorporeal circulation, coronary artery bypass grafting x5 (left internal mammary artery to left anterior descending artery, sequential saphenous vein graft to diagonal one branches, and sequential saphenous vein graft to obtuse marginal and OM2 (PDA)), and endoscopic vein harvest, right leg.  SURGEON:  Revonda Standard. Roxan Hockey, M.D.  ASSISTANT:  Jadene Pierini, P.A.-C.  ANESTHESIA:  General.    Discharge Exam: Blood pressure (!) 141/80, pulse 99, temperature 97.8 F (36.6 C), temperature source Oral, resp. rate (!) 24, height 5' 2.01" (1.575 m), weight 161 lb 6 oz (73.2 kg), SpO2 94 %.   General appearance: alert, cooperative and no distress Heart: regular rate and rhythm Lungs: clear to auscultation bilaterally Abdomen: soft, non-tender Extremities: + edema Wound: incis healing well   Disposition: 01-Home or Self Care  Discharge Instructions    AMB Referral to [place] Management   Complete by:  As directed    Christine [name] was sent for consultation regarding coronary artery disease by Dr. Roxan Hockey who evaluated the patient with and proceeded with CABG.  Patient reports history of pre-diabetes and now noted to be an undiagnosed type II diabetic with a hemoglobin A1c of 9.0. Her current orders for Inpatient glycemic control is Novolog 0-15 units tidwc and metformin 500 mg bid. Patient was seen by Inpatient DM coordinator on this hospitalization.   She admits lack of knowledge about diabetes and ways in managing it.  Patient is motivated to make changes to control blood sugars at home. She mentioned having a glucose meter at home and instructed to check blood sugars regularly and record in logbook.   Reason for consult:  Referral to [facility] community care management coordinator for home evaluation and  assessment of care needs and management of DM.   Diagnoses of:  Diabetes   Expected date of contact:  1-3 days (reserved for hospital discharges)   Amb Referral to Cardiac Rehabilitation   Complete by:  As directed    Diagnosis:  CABG   CABG X ___:  5   Discharge patient   Complete by:  As directed    Discharge disposition:  01-Home or Self Care   Discharge patient date:  04/14/2017   Discharge patient   Complete by:  As directed    Discharge disposition:  01-Home or Self Care   Discharge patient date:  04/14/2017   Discharge patient   Complete by:  As directed    Discharge disposition:  01-Home or Self Care   Discharge patient date:  04/14/2017     Allergies as of 04/14/2017   No Known Allergies     Medication List    STOP taking these medications   isosorbide mononitrate 30 MG 24 hr tablet Commonly known as:  IMDUR   nitroGLYCERIN 0.4 MG SL tablet Commonly known as:  NITROSTAT     TAKE these medications   aspirin 325 MG EC tablet Take 1 tablet (325 mg total) daily by mouth.  Start taking on:  04/15/2017 What changed:    medication strength  how much to take   furosemide 40 MG tablet Commonly known as:  LASIX Take 1 tablet (40 mg total) daily by mouth. Start taking on:  04/15/2017   metFORMIN 1000 MG tablet Commonly known as:  GLUCOPHAGE Take 1 tablet (1,000 mg total) 2 (two) times daily with a meal by mouth.   metoprolol succinate 25 MG 24 hr tablet Commonly known as:  TOPROL-XL Take 1 tablet (25 mg total) by mouth daily.   oxyCODONE 5 MG immediate release tablet Commonly known as:  Oxy IR/ROXICODONE Take 1-2 tablets (5-10 mg total) every 6 (six) hours as needed by mouth for severe pain.   pantoprazole 40 MG tablet Commonly known as:  PROTONIX Take 40 mg daily as needed by mouth for heartburn.   potassium chloride SA 20 MEQ tablet Commonly known as:  K-DUR,KLOR-CON Take 1 tablet (20 mEq total) daily by mouth. Start taking on:  04/15/2017    rosuvastatin 40 MG tablet Commonly known as:  CRESTOR Take 1 tablet (40 mg total) by mouth daily at 6 PM.      Follow-up Information    Melrose Nakayama, MD Follow up.   Specialty:  Cardiothoracic Surgery Why:  Appointment to see the surgeon on 05/11/2017 at 10:30 AM.  Please obtain a chest x-ray at 10 AM at [facility] which is located in the same office complex on the first floor. Contact information: [address] [place]  [place] [address] [phone]        Nigel Mormon, MD Follow up.   Specialty:  Cardiology Why:  Appointment to see cardiologist on May 03, 2017 at 2:30 PM. Contact information: [place] [address] [phone]           Signed: John Giovanni 04/14/2017, 2:40 PM

## 2017-04-13 NOTE — Progress Notes (Signed)
Inpatient Diabetes Program Recommendations  AACE/ADA: New Consensus Statement on Inpatient Glycemic Control (2015)  Target Ranges:  Prepandial:   less than 140 mg/dL      Peak postprandial:   less than 180 mg/dL (1-2 hours)      Critically ill patients:  140 - 180 mg/dL   Lab Results  Component Value Date   GLUCAP 166 (H) 04/13/2017   HGBA1C 9.0 (H) 04/07/2017    Review of Glycemic Control  Diabetes history: Pre-diabetes Outpatient Diabetes medications: None Current orders for Inpatient glycemic control: Novolog 0-15 units tidwc, metformin 500 mg bid  Spoke with pt regarding HgbA1C of 9%. Discussed pathophysiology of diabetes, how diet, exercise and stress affect blood sugars, and importance of monitoring blood sugars at home. Discussed importance of lifestyle changes with weight-loss, and daily exercise to maintain good control. Pt has glucose meter at home and instructed to check blood sugars at different times during the day and record in logbook. Pt to take logbook to MD for review for possible needed medication adjustments. Pt states she is interested in OP Diabetes Education. Will order consult. Pt very motivated to make changes to control blood sugars at home. Answered all questions.  Inpatient Diabetes Program Recommendations:   Agree with d/c on metformin 500 mg bid Will order OP Diabetes Education consult Living Well with Diabetes book.  Thank you. Lorenda Peck, RD, LDN, CDE Inpatient Diabetes Coordinator [phone]

## 2017-04-13 NOTE — Care Management Note (Signed)
Case Management Note [name] RN, BSN Unit 4E-Case Manager [phone]  Patient Details  Name: Christine [name] MRN: [id number] Date of Birth: 05-Nov-1950  Subjective/Objective:   Pt admitted s/p CABG                 Action/Plan: PTA pt lived at home with wife- anticipate return home- CM to follow for d/c needs  Expected Discharge Date:  04/16/17               Expected Discharge Plan:  Home/Self Care  In-House Referral:  NA  Discharge planning Services  CM Consult  Post Acute Care Choice:    Choice offered to:     DME Arranged:    DME Agency:     HH Arranged:    [place] Agency:     Status of Service:  In process, will continue to follow  If discussed at Long Length of Stay Meetings, dates discussed:    Discharge Disposition:   Additional Comments:  Dawayne Patricia, RN 04/13/2017, 11:52 AM

## 2017-04-13 NOTE — Progress Notes (Signed)
CARDIAC REHAB PHASE I   PRE:  Rate/Rhythm: 93 SR  BP:  Sitting: 119/69        SaO2: 95 RA  MODE:  Ambulation: 450 ft   POST:  Rate/Rhythm: 102 ST  BP:  Sitting: 152/88         SaO2: 98 RA  Pt ambulated 450 ft on RA, rolling walker, independent, steady gait, tolerated well. Pt c/o mild DOE, denies any other complaints, declined rest stop. Encouraged IS, additional ambulation x2 today. Pt to bed per pt request after walk, call bell within reach. Will follow.   [phone] Lenna Sciara, RN, BSN 04/13/2017 9:39 AM

## 2017-04-13 NOTE — Consult Note (Signed)
[facility] CM Primary Care Navigator  04/13/2017  Christine [name] December 17, 1950 [id number]   Met with patient at the bedside to identify possible discharge needs. Patient reports having "increased pain to back of throat, shortness of breath and chest pain" that led to this admission/ surgery.   Patient endorses Dr. Deland Pretty with [facility] as the primary care provider.   Patient shared using [place] on [facility] to obtain medications without any problem.   Patient manages her own medications at home straight out of the containers.   Patient reports that she drives prior to admission/ surgery. Her husband Eddie Dibbles), son Emi [place]) and daughter Cornelius Moras) will be able to provide transportation to her doctors' appointments after discharge.   Patient states that husband will be her primary caregiver at home. Son and daughter will be able to assist with her care needs as well.   Anticipated discharge plan is home per patient.  Patient voiced understanding to call primary care provider's office for a post discharge follow-up appointment within a week or sooner if needs arise. Patient letter (with PCP's contact number) was provided as a reminder.  Patient reports history of pre-diabetes and now noted to be an undiagnosed type II diabetic with a hemoglobin A1c of 9.0. Her current orders for Inpatient glycemic control is Novolog 0-15 units tidwc and metformin 500 mg bid.  Explained to patient about [facility] care management services available for further health management at home and she voiced interest with it.  Patient was seen by Inpatient DM coordinator on this hospitalization.  She admits lack of knowledge about diabetes and ways in managing it.  Patient is motivated to make changes to control blood sugars at home. She mentioned having a glucose meter at home and instructed to check blood sugars regularly and record in logbook.  Patient verbally  agreed and opted for referral to [place] care management coordinator for home evaluation and assessment of care needs and management of DM.  Referral made to [place] care management to follow-up after discharge.  [facility] care management information provided for future needs thatshe may have.    For questions, please contact:  Dannielle Huh, BSN, RN- [facility] Primary Care Navigator  Telephone: [phone] [place]

## 2017-04-13 NOTE — Discharge Instructions (Signed)
Endoscopic Saphenous Vein Harvesting, Care After °Refer to this sheet in the next few weeks. These instructions provide you with information about caring for yourself after your procedure. Your health care provider may also give you more specific instructions. Your treatment has been planned according to current medical practices, but problems sometimes occur. Call your health care provider if you have any problems or questions after your procedure. °What can I expect after the procedure? °After the procedure, it is common to have: °· Pain. °· Bruising. °· Swelling. °· Numbness. ° °Follow these instructions at home: °Medicine °· Take over-the-counter and prescription medicines only as told by your health care provider. °· Do not drive or operate heavy machinery while taking prescription pain medicine. °Incision care ° °· Follow instructions from your health care provider about how to take care of the cut made during surgery (incision). Make sure you: °? Wash your hands with soap and water before you change your bandage (dressing). If soap and water are not available, use hand sanitizer. °? Change your dressing as told by your health care provider. °? Leave stitches (sutures), skin glue, or adhesive strips in place. These skin closures may need to be in place for 2 weeks or longer. If adhesive strip edges start to loosen and curl up, you may trim the loose edges. Do not remove adhesive strips completely unless your health care provider tells you to do that. °· Check your incision area every day for signs of infection. Check for: °? More redness, swelling, or pain. °? More fluid or blood. °? Warmth. °? Pus or a bad smell. °General instructions °· Raise (elevate) your legs above the level of your heart while you are sitting or lying down. °· Do any exercises your health care providers have given you. These may include deep breathing, coughing, and walking exercises. °· Do not shower, take baths, swim, or use a hot tub  unless told by your health care provider. °· Wear your elastic stocking if told by your health care provider. °· Keep all follow-up visits as told by your health care provider. This is important. °Contact a health care provider if: °· Medicine does not help your pain. °· Your pain gets worse. °· You have new leg bruises or your leg bruises get bigger. °· You have a fever. °· Your leg feels numb. °· You have more redness, swelling, or pain around your incision. °· You have more fluid or blood coming from your incision. °· Your incision feels warm to the touch. °· You have pus or a bad smell coming from your incision. °Get help right away if: °· Your pain is severe. °· You develop pain, tenderness, warmth, redness, or swelling in any part of your leg. °· You have chest pain. °· You have trouble breathing. °This information is not intended to replace advice given to you by your health care provider. Make sure you discuss any questions you have with your health care provider. °Document Released: 01/28/2011 Document Revised: 10/24/2015 Document Reviewed: 04/01/2015 °Elsevier Interactive Patient Education © 2018 [employer] °Coronary Artery Bypass Grafting, Care After °These instructions give you information on caring for yourself after your procedure. Your doctor may also give you more specific instructions. Call your doctor if you have any problems or questions after your procedure. °Follow these instructions at home: °· Only take medicine as told by your doctor. Take medicines exactly as told. Do not stop taking medicines or start any new medicines without talking to your doctor first. °·   Take your pulse as told by your doctor. °· Do deep breathing as told by your doctor. Use your breathing device (incentive spirometer), if given, to practice deep breathing several times a day. Support your chest with a pillow or your arms when you take deep breaths or cough. °· Keep the area clean, dry, and protected where the  surgery cuts (incisions) were made. Remove bandages (dressings) only as told by your doctor. If strips were applied to surgical area, do not take them off. They fall off on their own. °· Check the surgery area daily for puffiness (swelling), redness, or leaking fluid. °· If surgery cuts were made in your legs: °? Avoid crossing your legs. °? Avoid sitting for long periods of time. Change positions every 30 minutes. °? Raise your legs when you are sitting. Place them on pillows. °· Wear stockings that help keep blood clots from forming in your legs (compression stockings). °· Only take sponge baths until your doctor says it is okay to take showers. Pat the surgery area dry. Do not rub the surgery area with a washcloth or towel. Do not bathe, swim, or use a hot tub until your doctor says it is okay. °· Eat foods that are high in fiber. These include raw fruits and vegetables, whole grains, beans, and nuts. Choose lean meats. Avoid canned, processed, and fried foods. °· Drink enough fluids to keep your pee (urine) clear or pale yellow. °· Weigh yourself every day. °· Rest and limit activity as told by your doctor. You may be told to: °? Stop any activity if you have chest pain, shortness of breath, changes in heartbeat, or dizziness. Get help right away if this happens. °? Move around often for short amounts of time or take short walks as told by your doctor. Gradually become more active. You may need help to strengthen your muscles and build endurance. °? Avoid lifting, pushing, or pulling anything heavier than 10 pounds (4.5 kg) for at least 6 weeks after surgery. °· Do not drive until your doctor says it is okay. °· Ask your doctor when you can go back to work. °· Ask your doctor when you can begin sexual activity again. °· Follow up with your doctor as told. °Contact a doctor if: °· You have puffiness, redness, more pain, or fluid draining from the incision site. °· You have a fever. °· You have puffiness in your  ankles or legs. °· You have pain in your legs. °· You gain 2 or more pounds (0.9 kg) a day. °· You feel sick to your stomach (nauseous) or throw up (vomit). °· You have watery poop (diarrhea). °Get help right away if: °· You have chest pain that goes to your jaw or arms. °· You have shortness of breath. °· You have a fast or irregular heartbeat. °· You notice a "clicking" in your breastbone when you move. °· You have numbness or weakness in your arms or legs. °· You feel dizzy or light-headed. °This information is not intended to replace advice given to you by your health care provider. Make sure you discuss any questions you have with your health care provider. °Document Released: 05/23/2013 Document Revised: 10/24/2015 Document Reviewed: 10/25/2012 °Elsevier Interactive Patient Education © 2017 Elsevier Inc. ° °

## 2017-04-13 NOTE — Progress Notes (Addendum)
[address]       [employer] [address]             [phone]      4 Days Post-Op Procedure(s) (LRB): CORONARY ARTERY BYPASS GRAFTING (CABG)x5 USING RIGHT SAPHENOUS VEIN, HARVESTED ENDOSCOPICALLY AND LEFT INTERNAL MAMMARY ARTERY (N/A) TRANSESOPHAGEAL ECHOCARDIOGRAM (TEE) (N/A) Subjective: Feels pretty well, some discomfort and some nausea   Objective: Vital signs in last 24 hours: Temp:  [97.9 F (36.6 C)-99 F (37.2 C)] 98 F (36.7 C) (11/13 0501) Pulse Rate:  [88-93] 89 (11/12 2034) Cardiac Rhythm: Normal sinus rhythm (11/12 2000) Resp:  [15-29] 29 (11/12 2034) BP: (106-122)/(62-73) 122/73 (11/13 0501) SpO2:  [93 %-99 %] 98 % (11/13 0501) Weight:  [161 lb 1.6 oz (73.1 kg)] 161 lb 1.6 oz (73.1 kg) (11/13 0501)  Hemodynamic parameters for last 24 hours:    Intake/Output from previous day: 11/12 0701 - 11/13 0700 In: 140 [P.O.:120; I.V.:20] Out: 1700 [Urine:1700] Intake/Output this shift: No intake/output data recorded.  General appearance: alert, cooperative and no distress Heart: regular rate and rhythm Lungs: mildly dim in bases Abdomen: benign Extremities: minor edema Wound: incis healing well  Lab Results: Recent Labs    04/12/17 0221 04/13/17 0316  WBC 12.0* 11.2*  HGB 8.7* 8.4*  HCT 27.7* 26.5*  PLT 194 253   BMET:  Recent Labs    04/12/17 0221 04/13/17 0316  NA 135 136  K 4.4 4.3  CL 100* 99*  CO2 29 28  GLUCOSE 163* 153*  BUN 17 14  CREATININE 0.89 0.94  CALCIUM 8.5* 8.6*    PT/INR: No results for input(s): LABPROT, INR in the last 72 hours. ABG    Component Value Date/Time   PHART 7.347 (L) 04/09/2017 1934   HCO3 25.2 04/09/2017 1934   TCO2 27 04/09/2017 1934   ACIDBASEDEF 6.0 (H) 04/09/2017 1620   O2SAT 96.0 04/09/2017 1934   CBG (last 3)  Recent Labs    04/12/17 1743 04/12/17 2123 04/13/17 0705  GLUCAP 167* 165* 166*    Meds Scheduled Meds: . acetaminophen  1,000 mg Oral Q6H   Or  .  acetaminophen (TYLENOL) oral liquid 160 mg/5 mL  1,000 mg Per Tube Q6H  . aspirin EC  325 mg Oral Daily   Or  . aspirin  324 mg Per Tube Daily  . bisacodyl  10 mg Oral Daily   Or  . bisacodyl  10 mg Rectal Daily  . docusate sodium  200 mg Oral Daily  . enoxaparin (LOVENOX) injection  40 mg Subcutaneous QHS  . furosemide  40 mg Oral Daily  . insulin aspart  0-15 Units Subcutaneous TID WC  . metoprolol succinate  25 mg Oral Daily  . mupirocin ointment   Nasal BID  . pantoprazole  40 mg Oral Daily  . potassium chloride SA  20 mEq Oral Daily  . rosuvastatin  40 mg Oral q1800  . sodium chloride flush  3 mL Intravenous Q12H  . sodium chloride flush  3 mL Intravenous Q12H   Continuous Infusions: . sodium chloride Stopped (04/10/17 1200)  . sodium chloride    . sodium chloride Stopped (04/10/17 1200)  . sodium chloride    . lactated ringers Stopped (04/12/17 0815)  . lactated ringers Stopped (04/10/17 0800)  . phenylephrine (NEO-SYNEPHRINE) Adult infusion Stopped (04/10/17 1628)   PRN Meds:.sodium chloride, sodium chloride, magnesium hydroxide, metoprolol tartrate, midazolam, ondansetron (ZOFRAN) IV, oxyCODONE, phenol, sodium chloride flush, sodium chloride flush,  traMADol, zolpidem  Xrays Dg Chest Port 1 View  Result Date: 04/12/2017 CLINICAL DATA:  Atelectasis.  3 days status post CABG x 5. EXAM: PORTABLE CHEST 1 VIEW COMPARISON:  One-view chest x-ray 04/11/2017 FINDINGS: The heart is enlarged, exaggerated by low lung volumes. Areas of linear airspace disease are again seen bilaterally, improved from the prior exam. Left greater than right small pleural effusions are again seen, improved. The upper lung fields are clear. The patient is status post median sternotomy for CABG. External pacing wires remain. IMPRESSION: 1. Improving aeration with residual linear airspace disease, likely atelectasis, left greater than right. 2. Improving bilateral pleural effusions, left greater than right. 3.  Stable cardiomegaly with decreasing pulmonary vascular congestion. Electronically Signed   By: San Morelle M.D.   On: 04/12/2017 07:22    Assessment/Plan: S/P Procedure(s) (LRB): CORONARY ARTERY BYPASS GRAFTING (CABG)x5 USING RIGHT SAPHENOUS VEIN, HARVESTED ENDOSCOPICALLY AND LEFT INTERNAL MAMMARY ARTERY (N/A) TRANSESOPHAGEAL ECHOCARDIOGRAM (TEE) (N/A)   1 overall doing well 2 hemodyn stable, very short burst of afib last evening, rare PAC's,K+ 4.3, most recent Mg++ 2.1, will also check TSH 3 blood sugars fair control. Will get DM coordinator to see . HgB A1C is 9.0 which corellates with an average BS of 243, needs aggressive dietary management and probably metformin but will see what they recommend 4 cont to diurese 5 cont pulm toilet and rehab 6 prob home 1-2 days  LOS: 4 days    GOLD,WAYNE E 04/13/2017 Patient seen and examined, agree with above I think she needs to be on metformin- will start 500 mg PO BID Dc pacing wires hopefully home tomorrow  Remo Lipps C. Roxan Hockey, MD [facility] [phone]

## 2017-04-13 NOTE — Care Management Important Message (Signed)
Important Message  Patient Details  Name: Christine [name] MRN: [id number] Date of Birth: 01/06/1951   Medicare Important Message Given:  Yes    Nathen May 04/13/2017, 11:23 AM

## 2017-04-14 MED ORDER — POTASSIUM CHLORIDE CRYS ER 20 MEQ PO TBCR: 20.0000 meq | EXTENDED_RELEASE_TABLET | Freq: Every day | ORAL | 0 refills | Status: DC

## 2017-04-14 MED ORDER — METFORMIN HCL 1000 MG PO TABS: 1000.0000 mg | ORAL_TABLET | Freq: Two times a day (BID) | ORAL | 1 refills | Status: DC

## 2017-04-14 MED ORDER — ASPIRIN 325 MG PO TBEC: 325.0000 mg | DELAYED_RELEASE_TABLET | Freq: Every day | ORAL | 0 refills | Status: DC

## 2017-04-14 MED ORDER — FUROSEMIDE 40 MG PO TABS: 40.0000 mg | ORAL_TABLET | Freq: Every day | ORAL | 0 refills | Status: DC

## 2017-04-14 MED ORDER — OXYCODONE HCL 5 MG PO TABS: 5.0000 mg | ORAL_TABLET | Freq: Four times a day (QID) | ORAL | 0 refills | Status: DC | PRN

## 2017-04-14 MED FILL — Heparin Sodium (Porcine) Inj 1000 Unit/ML: INTRAMUSCULAR | Qty: 2500 | Status: AC

## 2017-04-14 MED FILL — Potassium Chloride Inj 2 mEq/ML: INTRAVENOUS | Qty: 20 | Status: AC

## 2017-04-14 MED FILL — Heparin Sodium (Porcine) Inj 1000 Unit/ML: INTRAMUSCULAR | Qty: 30 | Status: AC

## 2017-04-14 MED FILL — Magnesium Sulfate Inj 50%: INTRAMUSCULAR | Qty: 10 | Status: AC

## 2017-04-14 NOTE — Progress Notes (Signed)
[address]       [employer] [address]             [phone]      5 Days Post-Op Procedure(s) (LRB): CORONARY ARTERY BYPASS GRAFTING (CABG)x5 USING RIGHT SAPHENOUS VEIN, HARVESTED ENDOSCOPICALLY AND LEFT INTERNAL MAMMARY ARTERY (N/A) TRANSESOPHAGEAL ECHOCARDIOGRAM (TEE) (N/A) Subjective: Nausea and vomiting with each meal  Objective: Vital signs in last 24 hours: Temp:  [97.9 F (36.6 C)-98.7 F (37.1 C)] 98.7 F (37.1 C) (11/14 0300) Pulse Rate:  [79-85] 80 (11/14 0300) Cardiac Rhythm: Normal sinus rhythm (11/13 2009) Resp:  [17-24] 17 (11/13 2000) BP: (105-128)/(68-75) 128/68 (11/14 0300) SpO2:  [94 %] 94 % (11/13 2000) Weight:  [161 lb 6 oz (73.2 kg)] 161 lb 6 oz (73.2 kg) (11/14 0300)  Hemodynamic parameters for last 24 hours:    Intake/Output from previous day: 11/13 0701 - 11/14 0700 In: 728 [P.O.:720; I.V.:8] Out: -  Intake/Output this shift: No intake/output data recorded.  General appearance: alert, cooperative and no distress Heart: regular rate and rhythm Lungs: clear to auscultation bilaterally Abdomen: soft, non-tender Extremities: + edema Wound: incis healing well  Lab Results: Recent Labs    04/12/17 0221 04/13/17 0316  WBC 12.0* 11.2*  HGB 8.7* 8.4*  HCT 27.7* 26.5*  PLT 194 253   BMET:  Recent Labs    04/12/17 0221 04/13/17 0316  NA 135 136  K 4.4 4.3  CL 100* 99*  CO2 29 28  GLUCOSE 163* 153*  BUN 17 14  CREATININE 0.89 0.94  CALCIUM 8.5* 8.6*    PT/INR: No results for input(s): LABPROT, INR in the last 72 hours. ABG    Component Value Date/Time   PHART 7.347 (L) 04/09/2017 1934   HCO3 25.2 04/09/2017 1934   TCO2 27 04/09/2017 1934   ACIDBASEDEF 6.0 (H) 04/09/2017 1620   O2SAT 96.0 04/09/2017 1934   CBG (last 3)  Recent Labs    04/13/17 1124 04/13/17 1643 04/13/17 2122  GLUCAP 178* 154* 142*    Meds Scheduled Meds: . acetaminophen  1,000 mg Oral Q6H   Or  . acetaminophen (TYLENOL) oral  liquid 160 mg/5 mL  1,000 mg Per Tube Q6H  . aspirin EC  325 mg Oral Daily   Or  . aspirin  324 mg Per Tube Daily  . bisacodyl  10 mg Oral Daily   Or  . bisacodyl  10 mg Rectal Daily  . docusate sodium  200 mg Oral Daily  . enoxaparin (LOVENOX) injection  40 mg Subcutaneous QHS  . furosemide  40 mg Oral Daily  . insulin aspart  0-15 Units Subcutaneous TID WC  . metFORMIN  500 mg Oral BID WC  . metoprolol succinate  25 mg Oral Daily  . mupirocin ointment   Nasal BID  . pantoprazole  40 mg Oral Daily  . potassium chloride SA  20 mEq Oral Daily  . rosuvastatin  40 mg Oral q1800  . sodium chloride flush  3 mL Intravenous Q12H  . sodium chloride flush  3 mL Intravenous Q12H   Continuous Infusions: . sodium chloride Stopped (04/10/17 1200)  . sodium chloride    . sodium chloride Stopped (04/10/17 1200)  . sodium chloride    . lactated ringers Stopped (04/12/17 0815)  . lactated ringers Stopped (04/10/17 0800)  . phenylephrine (NEO-SYNEPHRINE) Adult infusion Stopped (04/10/17 1628)   PRN Meds:.sodium chloride, sodium chloride, magnesium hydroxide, metoprolol tartrate, midazolam, ondansetron (ZOFRAN) IV, oxyCODONE, phenol,  sodium chloride flush, sodium chloride flush, traMADol, zolpidem  Xrays No results found.  Assessment/Plan: S/P Procedure(s) (LRB): CORONARY ARTERY BYPASS GRAFTING (CABG)x5 USING RIGHT SAPHENOUS VEIN, HARVESTED ENDOSCOPICALLY AND LEFT INTERNAL MAMMARY ARTERY (N/A) TRANSESOPHAGEAL ECHOCARDIOGRAM (TEE) (N/A)  1 overall doing well but having persist. Issues with nausea/vomiting- suspect gastroparesis- may need to consider evaluation or poss meds 2 BS - fair control, will prob require increase in metformin dosing 3 Hypertensive this am but has been normal on most readings- monitor 4 some PAC's - mostly sinus rhythm 5 cont pulm toilet and rehab 6 will discuss with MD     LOS: 5 days    Satchel Heidinger E 04/14/2017

## 2017-04-14 NOTE — Progress Notes (Signed)
Patient is ready for discharge. Patient has had all of her questions answered regarding her discharge instructions. Patient has all of her belongings and will be transported home by her daughter who is her with her. Patient can afford all of her medications. Patient left in wheelchair with Nurse Tech to meet daughter at the entrance of the hospital.

## 2017-04-14 NOTE — Progress Notes (Signed)
Removed sutures per order. Patient tolerated well.

## 2017-04-14 NOTE — Care Management Note (Signed)
Case Management Note [name] RN, BSN Unit 4E-Case Manager [phone]  Patient Details  Name: Christine [name] MRN: [id number] Date of Birth: 08/25/1950  Subjective/Objective:   Pt admitted s/p CABG                 Action/Plan: PTA pt lived at home with wife- anticipate return home- CM to follow for d/c needs  Expected Discharge Date:  04/14/17               Expected Discharge Plan:  Home/Self Care  In-House Referral:  NA  Discharge planning Services  CM Consult  Post Acute Care Choice:  NA Choice offered to:  NA  DME Arranged:  N/A DME Agency:  NA  HH Arranged:  NA HH Agency:  NA  Status of Service:  Completed, signed off  If discussed at [place] of Stay Meetings, dates discussed:    Discharge Disposition: home/self care   Additional Comments:  04/14/17- [place] RN, CM- pt for d/c home today- no CM needs noted for discharge.  Dawayne Patricia, RN 04/14/2017, 3:29 PM

## 2017-04-14 NOTE — Progress Notes (Signed)
CARDIAC REHAB PHASE I   PRE:  Rate/Rhythm: 94 SR  BP:  Supine:   Sitting: 140/74  Standing:    SaO2: 98%RA  MODE:  Ambulation: 400 ft   POST:  Rate/Rhythm: 116 ST  BP:  Supine:   Sitting: 153/80, 137/74  Standing:    SaO2: 99%RA 1122-1220 Pt denied nausea. Pt walked 400 ft with rolling walker with steady gait. Pt stated she did not think she needed a walker for home. Education completed with pt who voiced understanding. Stressed importance of IS and flutter valve. Reviewed carb counting and encouraged pt to ask MD for outpt diabetic classes when feeling stronger. Gave diabetic and heart healthy diets. Reviewed ex ed and CRP 2. Will refer to [place] at pt's request. Offered to show diabetic videos but pt declined at this time.   Graylon Good, RN BSN  04/14/2017 12:16 PM

## 2017-04-14 NOTE — Consult Note (Signed)
Referring Provider: Jadene Pierini PA_C Primary Care Physician:  Deland Pretty, MD Primary Gastroenterologist:  Dr. Thana Farr   Reason for Consultation:  Nausea and vomiting  HPI: Christine [name] is a 66 y.o. female with past medical history of coronary artery disease, GERD underwent CABG 5 on 04/09/2017. GI is consulted for further evaluation of nausea and vomiting.  Patient seen and examined at bedside. Daughter also at the bedside. Patient is followed by [facility].  Patient was complaining of some nausea and vomiting for last few days which is completely resolved now. She is able to tolerate a heart healthy diet at this time without any further nausea. Denied dysphagia or odynophagia. Denied abdominal pain, diarrhea or constipation. Denied blood in the stool or black stool.  He had multiple EGD and colonoscopy in the past by Dr. Thana Farr.   Past Medical History:  Diagnosis Date  . Angina pectoris ([place])   . Arthritis   . CAD, multiple vessel   . Cancer (HCC)    18 MONTHS  BASAL CELL   . Chest pain ON EXERTION   . Dyspnea    W/ EXERTION   . GERD (gastroesophageal reflux disease)   . H/O echocardiogram 1990   NORMAL  . Pre-diabetes     Past Surgical History:  Procedure Laterality Date  . BASAL CELL CARCINOMA EXCISION     18 MO OLD    Prior to Admission medications   Medication Sig Start Date End Date Taking? Authorizing Provider  aspirin EC 81 MG tablet Take 81 mg daily by mouth.   Yes [provider]  isosorbide mononitrate (IMDUR) 30 MG 24 hr tablet Take 1 tablet (30 mg total) by mouth daily. 04/01/17  Yes Patwardhan, Manish J, MD  metoprolol succinate (TOPROL-XL) 25 MG 24 hr tablet Take 1 tablet (25 mg total) by mouth daily. 04/01/17  Yes Patwardhan, Manish J, MD  pantoprazole (PROTONIX) 40 MG tablet Take 40 mg daily as needed by mouth for heartburn.    Yes [provider]  rosuvastatin (CRESTOR) 40 MG tablet Take 1 tablet (40 mg total) by mouth daily at 6 PM.  04/01/17  Yes Patwardhan, Manish J, MD  nitroGLYCERIN (NITROSTAT) 0.4 MG SL tablet Place 0.4 mg every 5 (five) minutes as needed under the tongue for chest pain. X 3 IF PAIN PERSISTS, CALL 911.    [provider]    Scheduled Meds: . acetaminophen  1,000 mg Oral Q6H   Or  . acetaminophen (TYLENOL) oral liquid 160 mg/5 mL  1,000 mg Per Tube Q6H  . aspirin EC  325 mg Oral Daily   Or  . aspirin  324 mg Per Tube Daily  . bisacodyl  10 mg Oral Daily   Or  . bisacodyl  10 mg Rectal Daily  . docusate sodium  200 mg Oral Daily  . enoxaparin (LOVENOX) injection  40 mg Subcutaneous QHS  . furosemide  40 mg Oral Daily  . insulin aspart  0-15 Units Subcutaneous TID WC  . metFORMIN  500 mg Oral BID WC  . metoprolol succinate  25 mg Oral Daily  . mupirocin ointment   Nasal BID  . pantoprazole  40 mg Oral Daily  . potassium chloride SA  20 mEq Oral Daily  . rosuvastatin  40 mg Oral q1800  . sodium chloride flush  3 mL Intravenous Q12H  . sodium chloride flush  3 mL Intravenous Q12H   Continuous Infusions: . sodium chloride Stopped (04/10/17 1200)  . sodium chloride    .  sodium chloride Stopped (04/10/17 1200)  . sodium chloride    . lactated ringers Stopped (04/12/17 0815)  . lactated ringers Stopped (04/10/17 0800)  . phenylephrine (NEO-SYNEPHRINE) Adult infusion Stopped (04/10/17 1628)   PRN Meds:.sodium chloride, sodium chloride, magnesium hydroxide, metoprolol tartrate, midazolam, ondansetron (ZOFRAN) IV, oxyCODONE, phenol, sodium chloride flush, sodium chloride flush, traMADol, zolpidem  Allergies as of 04/06/2017  . (No Known Allergies)    Family History  Problem Relation Age of Onset  . Cancer Mother        LUNG  . Cancer Father        COLON, MELANOMA  . Heart disease Father 62       MI   . Stroke Father   . Heart disease Brother 41       MI    Social History   Socioeconomic History  . Marital status: Married    Spouse name: Not on file  . Number of  children: Not on file  . Years of education: Not on file  . Highest education level: Not on file  Social Needs  . Financial resource strain: Not on file  . Food insecurity - worry: Not on file  . Food insecurity - inability: Not on file  . Transportation needs - medical: Not on file  . Transportation needs - non-medical: Not on file  Occupational History  . Not on file  Tobacco Use  . Smoking status: Never Smoker  . Smokeless tobacco: Never Used  Substance and Sexual Activity  . Alcohol use: No    Frequency: Never  . Drug use: No  . Sexual activity: Not on file  Other Topics Concern  . Not on file  Social History Narrative  . Not on file    Review of Systems: All negative except as stated above in HPI.  Physical Exam: Vital signs: Vitals:   04/14/17 0300 04/14/17 0820  BP: 128/68 (!) 141/80  Pulse: 80 99  Resp:  (!) 24  Temp: 98.7 F (37.1 C) 97.8 F (36.6 C)  SpO2:     Last BM Date: 04/11/17 General:   Alert,  Well-developed, well-nourished, pleasant and cooperative in NAD HEENT: Normocephalic , atraumatic, extraocular movement intact Lungs:  Clear throughout to auscultation.   No wheezes, crackles, or rhonchi. No acute distress. Heart:  Regular rate and rhythm; no murmurs, clicks, rubs,  or gallops. Abdomen: Soft, nontender, nondistended, bowel sounds present. No peritoneal signs LE : no edema  Rectal:  Deferred  GI:  Lab Results: Recent Labs    04/12/17 0221 04/13/17 0316  WBC 12.0* 11.2*  HGB 8.7* 8.4*  HCT 27.7* 26.5*  PLT 194 253   BMET Recent Labs    04/12/17 0221 04/13/17 0316  NA 135 136  K 4.4 4.3  CL 100* 99*  CO2 29 28  GLUCOSE 163* 153*  BUN 17 14  CREATININE 0.89 0.94  CALCIUM 8.5* 8.6*   LFT No results for input(s): PROT, ALBUMIN, AST, ALT, ALKPHOS, BILITOT, BILIDIR, IBILI in the last 72 hours. PT/INR No results for input(s): LABPROT, INR in the last 72 hours.   Studies/Results: No results found.  Impression/Plan: -  Postoperative nausea and vomiting. Resolved now. - Status post CABG 5  04/09/2017  - Family  history of colon cancer in the father. Multiple colonoscopies in the past by Dr. Thana Farr according to patient  Recommendations -------------------------- - Patient is asymptomatic at this time. Tolerating heart healthy diet without any issues. No other GI symptoms -  Patient  has a follow-up with her primary GI in early December. - GI will sign off. Call us back if needed   LOS: 5 days   Otis Brace  MD, FACP 04/14/2017, 12:58 PM  Contact #  [phone]

## 2017-04-15 ENCOUNTER — Other Ambulatory Visit: Payer: Self-pay | Admitting: *Deleted

## 2017-04-15 ENCOUNTER — Encounter: Payer: Self-pay | Admitting: *Deleted

## 2017-04-15 ENCOUNTER — Telehealth (HOSPITAL_COMMUNITY): Payer: Self-pay

## 2017-04-15 LAB — GLUCOSE, CAPILLARY
GLUCOSE-CAPILLARY: 137 mg/dL — AB (ref 65–99)
Glucose-Capillary: 161 mg/dL — ABNORMAL HIGH (ref 65–99)
Glucose-Capillary: 104 mg/dL — ABNORMAL HIGH (ref 65–99)

## 2017-04-15 NOTE — Telephone Encounter (Signed)
Patients insurance is active and benefits verified through [facility] - $20.00 co-pay, no deductible, out of pocket amount of $4,400/$513.56 has been met, no co-insurance, and no pre-authorization is required. Passport/reference [phone]  Patient will contacted and scheduled after their follow up appt with the Cardiologist office upon review by the RN Navigator.

## 2017-04-15 NOTE — Patient Outreach (Addendum)
[facility]) Care Management  04/15/2017  Christine [name] [birth year] [phone]  Transition of care call  Discharged to home on 11/14, after  CABG surgery, recent diagnosis of Diabetes    Placed call to patient explained reason for the call and transition of care program ,  patient reports she is feeling better than yesterday.Patient reports no nausea or vomiting , tolerating diet so far. Patient has new diagnosis of diabetes reports she has all the supplies available, has not checked blood sugar yet on today but she will . Patient weight today is 148 lbs, denies shortness of breath or increase in swelling or shortness of breath. Patient describes incisions without increase in redness and drainage.   Reinforced continuing to use incentive spirometry 10 times each hour during the day, reports pain controlled has prn oxycodone if needed but hasn't used since being at home. Patient reports she is tolerating ambulation around her home gait is steady and not requiring cane or walker.    Outpatient Encounter Medications as of 04/15/2017  Medication Sig  . aspirin EC 325 MG EC tablet Take 1 tablet (325 mg total) daily by mouth.  . furosemide (LASIX) 40 MG tablet Take 1 tablet (40 mg total) daily by mouth.  . metFORMIN (GLUCOPHAGE) 1000 MG tablet Take 1 tablet (1,000 mg total) 2 (two) times daily with a meal by mouth.  . metoprolol succinate (TOPROL-XL) 25 MG 24 hr tablet Take 1 tablet (25 mg total) by mouth daily.  Marland Kitchen oxyCODONE (OXY IR/ROXICODONE) 5 MG immediate release tablet Take 1-2 tablets (5-10 mg total) every 6 (six) hours as needed by mouth for severe pain.  . pantoprazole (PROTONIX) 40 MG tablet Take 40 mg daily as needed by mouth for heartburn.   . potassium chloride SA (K-DUR,KLOR-CON) 20 MEQ tablet Take 1 tablet (20 mEq total) daily by mouth.  . rosuvastatin (CRESTOR) 40 MG tablet Take 1 tablet (40 mg total) by mouth daily at 6 PM.   No facility-administered encounter  medications on file as of 04/15/2017.   Patient was recently discharged from hospital and all medications have been reviewed.  Plan  Patient will receive weekly outreaches as part of transition of care program, patient has agreed to initial home visit in the next week. Provided RNCM contact information as well as 24 nurse line Will send MD barrier letter and patient welcome letter.   [facility] CM Care Plan Problem One     Most Recent Value  Care Plan Problem One  Recent hospital admission related to CABG surgery   Role Documenting the Problem One  Care Management Coordinator  Care Plan for Problem One  Active  [facility] Long Term Goal   Patient will not experience hospital admission in the next 31 days   [facility] Long Term Goal Start Date  04/15/17  Interventions for Problem One Long Term Goal  Reviewed transition of care outreach, reviewed discharge instructions, advised regarding importance of taking medication as prescribed and notifying MD of new concerns   [facility] CM Short Term Goal #1   Patient will attend all follow appointments in the next 30 days   [facility] CM Short Term Goal #1 Start Date  04/15/17  Interventions for Short Term Goal #1  Advised regarding the importance of attending all post hospital medical appointments, advised regarding  importance of PCP follow up as well and encouraged scheduling a visit   [facility] CM Short Term Goal #2   Patient will report monitoring blood sugars daily and  keeping a record in the next 30 days    [facility] CM Short Term Goal #2 Start Date  04/15/17  Interventions for Short Term Goal #2  Advised regarding importance of checking blood sugar and keeping a record , taking record to PCP appointment how it helps planning care   [facility] CM Short Term Goal #3  Over the next 30 days patient will weigh daily and keep a record   [facility] CM Short Term Goal #3 Start Date  04/15/17  Interventions for Short Tern Goal #3  RN verified patient has scales, reviewed to weigh in morning, urinate and then  weigh, reviewed sudden weight parameters to notify MD of        Joylene Draft, RN, [place] Management Coordinator  [phone]- Mobile [phone]- [place]

## 2017-04-16 NOTE — Addendum Note (Signed)
Addendum  created 04/16/17 1659 by Belinda Block, MD   Image imported, Intraprocedure Blocks edited, Sign clinical note

## 2017-04-19 DIAGNOSIS — E559 Vitamin D deficiency, unspecified: Secondary | ICD-10-CM | POA: Diagnosis not present

## 2017-04-19 DIAGNOSIS — E7800 Pure hypercholesterolemia, unspecified: Secondary | ICD-10-CM | POA: Diagnosis not present

## 2017-04-19 DIAGNOSIS — R7309 Other abnormal glucose: Secondary | ICD-10-CM | POA: Diagnosis not present

## 2017-04-19 DIAGNOSIS — Z23 Encounter for immunization: Secondary | ICD-10-CM | POA: Diagnosis not present

## 2017-04-20 ENCOUNTER — Other Ambulatory Visit: Payer: Self-pay | Admitting: *Deleted

## 2017-04-20 ENCOUNTER — Encounter: Payer: Self-pay | Admitting: *Deleted

## 2017-04-20 NOTE — Patient Outreach (Signed)
[facility] [facility]) Care Management   04/20/2017  Christine [name] 07-10-1950 [id number]  Christine [name] is an 66 y.o. female   Discharged to home on 11/14, after  CABG surgery, recent diagnosis of Diabetes.    Subjective:  I think I am getting along well since surgery, improving  daily.   Objective:  BP (!) 118/58 (BP Location: Left Arm, Patient Position: Sitting, Cuff Size: Normal)   Pulse 75   Resp (!) 21   Ht 1.575 m (5\' 2" )   Wt 148 lb (67.1 kg)   SpO2 96%   BMI 27.07 kg/m  Review of Systems  Constitutional: Negative.   HENT: Negative.   Eyes: Negative.   Respiratory: Negative.   Cardiovascular: Negative.   Gastrointestinal: Negative.   Genitourinary: Negative.   Musculoskeletal: Negative.   Skin: Negative.   Neurological: Negative.   Endo/Heme/Allergies: Negative.   Psychiatric/Behavioral: Negative.     Physical Exam  Constitutional: She is oriented to person, place, and time. She appears well-developed and well-nourished.  Cardiovascular: Normal rate, normal heart sounds and intact distal pulses.  Respiratory: Effort normal and breath sounds normal.  GI: Soft.  Neurological: She is alert and oriented to person, place, and time.  Skin: Skin is warm and dry.     Psychiatric: She has a normal mood and affect. Her behavior is normal. Judgment and thought content normal.    Encounter Medications:   Outpatient Encounter Medications as of 04/20/2017  Medication Sig Note  . aspirin EC 325 MG EC tablet Take 1 tablet (325 mg total) daily by mouth.   . furosemide (LASIX) 40 MG tablet Take 1 tablet (40 mg total) daily by mouth.   . metFORMIN (GLUCOPHAGE) 1000 MG tablet Take 1 tablet (1,000 mg total) 2 (two) times daily with a meal by mouth.   . metoprolol succinate (TOPROL-XL) 25 MG 24 hr tablet Take 1 tablet (25 mg total) by mouth daily.   . pantoprazole (PROTONIX) 40 MG tablet Take 40 mg daily as needed by mouth for heartburn.    . potassium chloride  SA (K-DUR,KLOR-CON) 20 MEQ tablet Take 1 tablet (20 mEq total) daily by mouth.   . oxyCODONE (OXY IR/ROXICODONE) 5 MG immediate release tablet Take 1-2 tablets (5-10 mg total) every 6 (six) hours as needed by mouth for severe pain. (Patient not taking: Reported on 04/20/2017)   . rosuvastatin (CRESTOR) 40 MG tablet Take 1 tablet (40 mg total) by mouth daily at 6 PM. (Patient not taking: Reported on 04/20/2017) 04/20/2017: Changed to another medication per PCP    No facility-administered encounter medications on file as of 04/20/2017.     Functional Status:   In your present state of health, do you have any difficulty performing the following activities: 04/20/2017 04/10/2017  Hearing? N -  Vision? N -  Difficulty concentrating or making decisions? N -  Walking or climbing stairs? N -  Dressing or bathing? N -  Doing errands, shopping? Y N  Comment family provides transportation  -  Conservation officer, nature and eating ? Y -  Comment family helps  -  Using the Toilet? N -  In the past six months, have you accidently leaked urine? N -  Do you have problems with loss of bowel control? N -  Managing your Medications? N -  Managing your Finances? N -  Housekeeping or managing your Housekeeping? Y -  Comment family helps  -  Some recent data might be hidden    Fall/Depression  Screening:    Fall Risk  04/20/2017  Falls in the past year? No   PHQ 2/9 Scores 04/20/2017  PHQ - 2 Score 0    Assessment:  Initial transition of care home visit , with Madilyn Fireman, Passenger transport manager.   Recent CABG- incisions healed, tolerating ambulation in home gait steady,reports taking some walks in home discussed increasing time, discussed having  some shortness of breath at times when walking but improved on today. Today's weight is 148, no swelling. Patient has completed PCP visit, has upcoming appointments with cardiology and surgeon. Patient continues to use incentive spirometry   Diabetes- checking blood sugar  about every other day, recent reading 147 denies symptoms of hypoglycemia, reviewed rule of 15. Will benefit from continued education on self care, meal planning.  Patient plans to reschedule vision exam and dental exams that she missed while hospitalized. Patient had Flu shot at PCP visit on yesterday. Patient to attend nutrition call at her PCP office at next month per report.  Advanced directives.-has packet from hospital admission , reviewed how to complete form, reports they have access to notary.   Plan:  Will continue weekly transition of care outreach, next call in a week.  [facility] CM Care Plan Problem One     Most Recent Value  Care Plan Problem One  Recent hospital admission related to CABG surgery   Role Documenting the Problem One  Care Management [place] for Problem One  Active  THN Long Term Goal   Patient will not experience hospital admission in the next 31 days   THN Long Term Goal Start Date  04/15/17  Interventions for Problem One Long Term Goal  Home visit completed , reviewed continued use of incentive spirometry and gradually increasing walking in home.   THN CM Short Term Goal #1   Patient will attend all follow appointments in the next 30 days   THN CM Short Term Goal #1 Start Date  04/15/17  Interventions for Short Term Goal #1  Reviewed recent MD office visit, and upcoming appointments   [facility] CM Short Term Goal #2   Patient will report monitoring blood sugars daily and  keeping a record in the next 30 days    THN CM Short Term Goal #2 Start Date  04/15/17  Interventions for Short Term Goal #2  Reinforced checking blood sugar on regular basis and keeping a record to help increase compliance with diabetes self care, provided [facility] calendar and how to use logging information   THN CM Short Term Goal #3  Over the next 30 days patient will weigh daily and keep a record   THN CM Short Term Goal #3 Start Date  04/15/17  Interventions for Short Tern Goal #3  Reviewed  importance of weighing and keeping a record.   THN CM Short Term Goal #4  Patient will verbalize increased knowledge of food choices and meal planning for diabetes in the next 30 days   THN CM Short Term Goal #4 Start Date  04/20/17  Interventions for Short Term Goal #4  provided THN diabetes book, reviewed food choices , plate method of meals       Joylene Draft, RN, [place] Management Coordinator  [phone]- Mobile [phone]- [place]

## 2017-04-21 ENCOUNTER — Encounter: Payer: Self-pay | Admitting: Internal Medicine

## 2017-04-27 ENCOUNTER — Other Ambulatory Visit: Payer: Self-pay | Admitting: *Deleted

## 2017-04-27 NOTE — Patient Outreach (Signed)
[name]) Care Management  04/27/2017  Christine [name] 10-25-1950 [id number]   Transition of care call  66 year old female , recent CABG and diagnosis of Diabetes   Unsuccessful attempt to contact patient by telephone, able to leave a HIPAA compliant message requesting a return call  Plan Will await return call , if no response will plan transition of care call in the next week.  Joylene Draft, RN, [place] Management Coordinator  [phone]- Mobile [phone]- Toll Free Main Office

## 2017-04-29 DIAGNOSIS — E559 Vitamin D deficiency, unspecified: Secondary | ICD-10-CM | POA: Diagnosis not present

## 2017-04-29 DIAGNOSIS — E7800 Pure hypercholesterolemia, unspecified: Secondary | ICD-10-CM | POA: Diagnosis not present

## 2017-04-29 DIAGNOSIS — E119 Type 2 diabetes mellitus without complications: Secondary | ICD-10-CM | POA: Diagnosis not present

## 2017-05-03 ENCOUNTER — Other Ambulatory Visit: Payer: Self-pay | Admitting: *Deleted

## 2017-05-03 ENCOUNTER — Other Ambulatory Visit: Payer: Self-pay | Admitting: Thoracic Surgery (Cardiothoracic Vascular Surgery)

## 2017-05-03 DIAGNOSIS — Z951 Presence of aortocoronary bypass graft: Secondary | ICD-10-CM

## 2017-05-03 NOTE — Patient Outreach (Signed)
[place] [facility]) Care Management  05/03/2017  Christine [name] 02-14-1951 [id number]  Transition of care call  Discharged to home on 11/14, after  CABG surgery, recent diagnosis of Diabetes    Successful outreach call to patient, she reports she is feeling much better, increasing her walking in home, denies shortness of breath or increased pain. Patient reports incision looks good without redness no drainage.  Patient reports her weights are staying in the 148 range without increase in swelling. Patient reports she is checking blood sugars daily , reading staying in 140- 150 range, denies having any low blood sugar symptoms.  Patient discussed attending a nutrition class at PCP office on last week.   Patient denies any new concerns at this time .  Plan Will continue weekly transition of care calls , next call in a week.    [facility] CM Care Plan Problem One     Most Recent Value  Care Plan Problem One  Recent hospital admission related to CABG surgery   Role Documenting the Problem One  Care Management [place] for Problem One  Active  THN Long Term Goal   Patient will not experience hospital admission in the next 31 days   THN Long Term Goal Start Date  04/15/17  Interventions for Problem One Long Term Goal  Reinforced continuing to work on incentive spirometry, taking medicatons as prescribed, and notifying MD sooner of new concerns .   THN CM Short Term Goal #1   Patient will attend all follow appointments in the next 30 days   THN CM Short Term Goal #1 Start Date  04/15/17  Interventions for Short Term Goal #1  Discussed upcoming visit with surgeon and plans for cardiac rehab.   THN CM Short Term Goal #2   Patient will report monitoring blood sugars daily and  keeping a record in the next 30 days    THN CM Short Term Goal #2 Start Date  04/15/17  Interventions for Short Term Goal #2  Reviewed recent reading of blood sugars ,   THN CM Short Term Goal #3   Over the next 30 days patient will weigh daily and keep a record   THN CM Short Term Goal #3 Start Date  04/15/17  [facility] CM Short Term Goal #3 Met Date  05/03/17  Interventions for Short Tern Goal #3  discussed current and recent weight results   THN CM Short Term Goal #4  Patient will verbalize increased knowledge of food choices and meal planning for diabetes in the next 30 days   THN CM Short Term Goal #4 Start Date  04/20/17  Interventions for Short Term Goal #4  Reviewed food choices to include in diet,       Joylene Draft, RN, [place] Management Coordinator  [phone]- Mobile [phone]- [place]

## 2017-05-04 ENCOUNTER — Other Ambulatory Visit: Payer: Self-pay

## 2017-05-04 ENCOUNTER — Ambulatory Visit (INDEPENDENT_AMBULATORY_CARE_PROVIDER_SITE_OTHER): Payer: Self-pay | Admitting: Thoracic Surgery (Cardiothoracic Vascular Surgery)

## 2017-05-04 ENCOUNTER — Ambulatory Visit
Admission: RE | Admit: 2017-05-04 | Discharge: 2017-05-04 | Disposition: A | Discharge status: Home / Self Care | Payer: Medicare Other | Source: Ambulatory Visit | Attending: Thoracic Surgery (Cardiothoracic Vascular Surgery) | Admitting: Thoracic Surgery (Cardiothoracic Vascular Surgery)

## 2017-05-04 ENCOUNTER — Encounter: Payer: Self-pay | Admitting: Thoracic Surgery (Cardiothoracic Vascular Surgery)

## 2017-05-04 VITALS — BP 128/82 | HR 100 | Resp 16 | Ht 62.0 in | Wt 145.0 lb

## 2017-05-04 DIAGNOSIS — J90 Pleural effusion, not elsewhere classified: Secondary | ICD-10-CM | POA: Diagnosis not present

## 2017-05-04 DIAGNOSIS — Z951 Presence of aortocoronary bypass graft: Secondary | ICD-10-CM

## 2017-05-04 DIAGNOSIS — I2510 Atherosclerotic heart disease of native coronary artery without angina pectoris: Secondary | ICD-10-CM

## 2017-05-04 MED ORDER — METOPROLOL SUCCINATE ER 25 MG PO TB24: 25.0000 mg | ORAL_TABLET | Freq: Two times a day (BID) | ORAL | 3 refills | Status: DC

## 2017-05-04 NOTE — Progress Notes (Signed)
[address]       [place] [address]             [phone]    HPI: Christine [name] returns for a scheduled follow-up visit  She is a 66 year old woman with a history of gastroesophageal reflux.  She presented with a 38-month history of exertional chest discomfort relieved with rest.  A nuclear study showed no significant ischemic changes.  However, her symptoms continued to progress and Dr. Virgina Jock suggested cardiac catheterization. She had significant ostial LAD and circumflex disease and diffusely diseased coronaries beyond that.  I did coronary bypass grafting x5 on 04/09/2017.  At the time of surgery, her LAD was a good target but her remaining targets were small and diffusely diseased.  She had a lot of nausea and vomiting while in the hospital.  He was seen in consultation by gastroenterology but by the time they got to see her her nausea had resolved.  She went home on postoperative day #5.  Since she has been home she has felt well.  She does not have any incisional pain.  Her exercise tolerance is improving.  She says she has felt some episodes where she thought her heart was racing the past couple of days.  Denies any peripheral edema.  During her hospitalization she was noted to have a hemoglobin A1c of 9.0 (average blood sugar 211) and was started on metformin for type 2 diabetes.    Past Medical History:  Diagnosis Date  . Angina pectoris ([place])   . Arthritis   . CAD, multiple vessel   . Cancer (HCC)    18 MONTHS  BASAL CELL   . Chest pain ON EXERTION   . Dyspnea    W/ EXERTION   . GERD (gastroesophageal reflux disease)   . H/O echocardiogram 1990   NORMAL  . Pre-diabetes     Current Outpatient Medications  Medication Sig Dispense Refill  . aspirin EC 325 MG EC tablet Take 1 tablet (325 mg total) daily by mouth. 30 tablet 0  . atorvastatin (LIPITOR) 20 MG tablet Take 20 mg by mouth daily.    . metFORMIN (GLUCOPHAGE) 1000 MG tablet Take 1 tablet  (1,000 mg total) 2 (two) times daily with a meal by mouth. 60 tablet 1  . metoprolol succinate (TOPROL-XL) 25 MG 24 hr tablet Take 1 tablet (25 mg total) by mouth 2 (two) times daily. 60 tablet 3  . pantoprazole (PROTONIX) 40 MG tablet Take 40 mg daily as needed by mouth for heartburn.      No current facility-administered medications for this visit.     Physical Exam BP 128/82 (BP Location: Right Arm, Patient Position: Sitting, Cuff Size: Large)   Pulse 100 Comment: IRREG  Resp 16   Ht 5\' 2"  (1.575 m)   Wt 145 lb (65.8 kg)   SpO2 98% Comment: ON RA  BMI 26.62 kg/m  66 year old woman in no acute distress Alert and oriented x3 with no focal deficits Cardiac slightly irregular Lungs with diminished breath sounds at left base, otherwise clear Sternal incision healing well, sternum stable Leg incision healing well no peripheral edema  Diagnostic Tests: CHEST  2 VIEW  COMPARISON:  Single-view of the chest 04/11/2017 and 04/12/2017.  FINDINGS: The patient has a small left and trace right pleural effusion. Minimal left basilar atelectasis is noted. Heart size is upper normal. No pulmonary edema. No pneumothorax. 6 intact median sternotomy wires are unchanged.  IMPRESSION: Trace  right and small left pleural effusion with minimal left basilar atelectasis. Aeration appears improved since the most recent exam. No new abnormality.   Electronically Signed   By: Inge Rise M.D.   On: 05/04/2017 16:02 I personally reviewed the chest x-ray images and concur with the findings noted above  Impression: Christine. Christine [name] is a 66 year old woman who presented with progressive exertional angina and was found to have significant two-vessel disease in the setting of a left dominant circulation.  She had coronary artery bypass grafting x5 on 04/09/2017.  Her LAD was a good target but her other vessels were small and diffusely diseased.  Despite that she did well without any significant  cardiac issues.  Her primary problem in the early postoperative period was nausea and vomiting, which resolved prior to discharge.  She has not had any problems with that since then.  Her exercise tolerance is good and I encouraged her to continue to build on that.  She is felt some palpitations over the past couple of days and on a rhythm strip in the office she is primarily in sinus rhythm but does have some PACs and has there are times when she will put multiple PACs together before going back into sinus.  I recommended that she increase her metoprolol XL to 25 mg twice a day.  Her blood pressure is high enough to tolerate that.  If she tolerates that dose with twice daily dosing, it could be condensed to 50 mg a day at a later time.  She may begin driving.  Appropriate precautions were discussed.  She should not lift anything over 10 pounds until Christmas.  Plan: She has a follow-up appointment with Dr. Virgina Jock tomorrow.  He can assess whether the increase in the dose of Toprol has affected her PACs.  I will defer to him whether she needs a 24-hour Holter monitor.  Melrose Nakayama, MD [facility] [phone]

## 2017-05-04 NOTE — Patient Instructions (Signed)
Increase metoprolol (Toprol) to 25 mg TWICE daily

## 2017-05-05 ENCOUNTER — Ambulatory Visit: Payer: Self-pay | Admitting: Gastroenterology

## 2017-05-05 DIAGNOSIS — Z0189 Encounter for other specified special examinations: Secondary | ICD-10-CM | POA: Diagnosis not present

## 2017-05-05 DIAGNOSIS — D649 Anemia, unspecified: Secondary | ICD-10-CM | POA: Diagnosis not present

## 2017-05-05 DIAGNOSIS — R7303 Prediabetes: Secondary | ICD-10-CM | POA: Diagnosis not present

## 2017-05-05 DIAGNOSIS — I491 Atrial premature depolarization: Secondary | ICD-10-CM | POA: Diagnosis not present

## 2017-05-05 DIAGNOSIS — R002 Palpitations: Secondary | ICD-10-CM | POA: Diagnosis not present

## 2017-05-07 ENCOUNTER — Telehealth (HOSPITAL_COMMUNITY): Payer: Self-pay

## 2017-05-07 NOTE — Telephone Encounter (Signed)
Called and spoke with patient in regards to Cardiac Rehab - Scheduled orientation on 06/03/2017 at 8:00am. Patient will attend the 8:15am exc class.

## 2017-05-10 ENCOUNTER — Other Ambulatory Visit: Payer: Self-pay | Admitting: *Deleted

## 2017-05-10 NOTE — Patient Outreach (Signed)
[place] [facility]) Care Management  05/10/2017  Christine [name] Nov 19, 1950 [id number]  Transition of care call  Discharged to home on 11/14, after CABG surgery, recent diagnosis of Diabetes     Successful call to patient, reports she is feeling better, denies shortness of breath, no cough. Patient discussed her recent visit with Dr.Hendrickson she has now been cleared to drive, patient reports her heart rate was a little elevated at visit and her metoprolol has been increased to twice daily. Patient follow up visit to cardiology and she is now wearing a heart monitor and has follow up visit in 2 week. Patient is monitoring her blood pressure, heart rate  at home and keeping a record, her rate is staying in the 90 range.   Patient reports she continues to monitor her blood sugar daily with readings staying in the 140 to 150 range.  Patient reports her appetite is up and down but she is still eating meals daily, discussed small more frequent meals.  Patient discussed looking forward to starting Cardiac Rehab in January. She continues to work toward increasing walking in home and when able to go out as weather permits.  Plan Will follow up with patient in the next week for final transition of care call and continue with complex care management follow up.   Joylene Draft, RN, [place] Management Coordinator  [phone]- Mobile [phone]- Toll Free Main Office

## 2017-05-11 ENCOUNTER — Ambulatory Visit: Payer: Medicare Other | Admitting: Thoracic Surgery (Cardiothoracic Vascular Surgery)

## 2017-05-18 ENCOUNTER — Other Ambulatory Visit: Payer: Self-pay | Admitting: *Deleted

## 2017-05-18 DIAGNOSIS — R002 Palpitations: Secondary | ICD-10-CM | POA: Diagnosis not present

## 2017-05-18 NOTE — Patient Outreach (Signed)
[place] [facility]) Care Management  05/18/2017  [name] 1950/12/13 [id number]   Final transition of care call  Unsuccessful attempt for transition of care call, no answer able to leave a HIPAA compliant message requesting a return call  Patient has completed 31 days of transition of care , will continue to follow for complex care management program .   Plan Will plan follow up call in the next week.  Joylene Draft, RN, [place] Management Coordinator  [phone]- Mobile [phone]- Toll Free Main Office

## 2017-05-21 ENCOUNTER — Encounter: Payer: Self-pay | Admitting: Thoracic Surgery (Cardiothoracic Vascular Surgery)

## 2017-05-26 ENCOUNTER — Other Ambulatory Visit: Payer: Self-pay | Admitting: *Deleted

## 2017-05-26 DIAGNOSIS — I739 Peripheral vascular disease, unspecified: Secondary | ICD-10-CM | POA: Diagnosis not present

## 2017-05-26 DIAGNOSIS — Z0189 Encounter for other specified special examinations: Secondary | ICD-10-CM | POA: Diagnosis not present

## 2017-05-26 DIAGNOSIS — I491 Atrial premature depolarization: Secondary | ICD-10-CM | POA: Diagnosis not present

## 2017-05-26 DIAGNOSIS — R7303 Prediabetes: Secondary | ICD-10-CM | POA: Diagnosis not present

## 2017-05-26 NOTE — Patient Outreach (Signed)
[place] [facility]) Care Management  05/26/2017  Christine [name] [birth year] [id number]  Telephone follow up call   Discharged to home on 11/14, after CABG surgery, recent diagnosis of Diabetes.  Successful outreach call to patient reports that she is doing fine,denies any new concerns at this time.  Patient further discussed:  Recent CABG Reports tolerated increased activity, denies shortness of breath, chest pain or palpitations.  Patient reports recent visit to cardiology office for follow up review after wearing event monitor, reports no rhythm problems noted. Patient to begin cardiac rehab in the next week.   Diabetes Patient reports reading in the 140 range, no low blood sugar episodes, discussed appetite slowly improving,  Plan Will follow up in the next month at 60 days of complex care management to follow up for continued care coordination needs  , if no needs will discuss case closure.    Joylene Draft, RN, [place] Management Coordinator  [phone]- Mobile [phone]- Toll Free Main Office

## 2017-05-28 ENCOUNTER — Telehealth (HOSPITAL_COMMUNITY): Payer: Self-pay | Admitting: Internal Medicine

## 2017-05-28 ENCOUNTER — Telehealth (HOSPITAL_COMMUNITY): Payer: Self-pay

## 2017-05-28 NOTE — Telephone Encounter (Signed)
Cardiac Rehab Medication Review by a Pharmacist  Does the patient  feel that his/her medications are working for him/her?  yes  Has the patient been experiencing any side effects to the medications prescribed?  Patient reports no side effects but mentions some leg soreness that may be due to the Lipitor. Will follow up with MD regarding muscle aches.  Does the patient measure his/her own blood pressure or blood glucose at home?  yes   Does the patient have any problems obtaining medications due to transportation or finances?   no  Understanding of regimen: excellent Understanding of indications: good Potential of compliance: good    Pharmacist comments: Patient was able to tell me all of her meds and dosages. She reports no issues obtaining medications. Regularly checks BP at home.    Christine [name] 05/28/2017 2:33 PM

## 2017-06-02 DIAGNOSIS — I739 Peripheral vascular disease, unspecified: Secondary | ICD-10-CM | POA: Diagnosis not present

## 2017-06-03 ENCOUNTER — Encounter (HOSPITAL_COMMUNITY): Payer: Self-pay

## 2017-06-03 ENCOUNTER — Encounter (HOSPITAL_COMMUNITY)
Admission: RE | Admit: 2017-06-03 | Discharge: 2017-06-03 | Disposition: A | Discharge status: Home / Self Care | Payer: Medicare HMO | Source: Ambulatory Visit | Attending: Cardiology | Admitting: Cardiology

## 2017-06-03 VITALS — BP 106/64 | HR 68 | Ht 61.0 in | Wt 152.1 lb

## 2017-06-03 DIAGNOSIS — Z951 Presence of aortocoronary bypass graft: Secondary | ICD-10-CM | POA: Insufficient documentation

## 2017-06-03 DIAGNOSIS — Z48812 Encounter for surgical aftercare following surgery on the circulatory system: Secondary | ICD-10-CM | POA: Insufficient documentation

## 2017-06-03 HISTORY — DX: Hyperlipidemia, unspecified: E78.5

## 2017-06-03 NOTE — Progress Notes (Signed)
Cardiac Individual Treatment Plan  Patient Details  Name: Christine [name] MRN: [id number] Date of Birth: Feb 18, 1951 Referring Provider:     CARDIAC REHAB PHASE II ORIENTATION from 06/03/2017 in [place]  Referring Provider  Nigel Mormon MD      Initial Encounter Date:    CARDIAC REHAB PHASE II ORIENTATION from 06/03/2017 in [facility]  Date  06/03/17  Referring Provider  Nigel Mormon MD      Visit Diagnosis: S/P CABG x 5 04/09/2017  Patient's Home Medications on Admission:  Current Outpatient Medications:  .  aspirin EC 81 MG tablet, Take 81 mg by mouth daily., Disp: , Rfl:  .  atorvastatin (LIPITOR) 20 MG tablet, Take 20 mg by mouth daily., Disp: , Rfl:  .  metFORMIN (GLUCOPHAGE) 1000 MG tablet, Take 1 tablet (1,000 mg total) 2 (two) times daily with a meal by mouth., Disp: 60 tablet, Rfl: 1 .  metoprolol succinate (TOPROL-XL) 25 MG 24 hr tablet, Take 1 tablet (25 mg total) by mouth 2 (two) times daily., Disp: 60 tablet, Rfl: 3 .  Multiple Vitamins-Minerals (MULTIVITAMIN WITH MINERALS) tablet, Take 1 tablet by mouth daily., Disp: , Rfl:  .  pantoprazole (PROTONIX) 40 MG tablet, Take 40 mg daily as needed by mouth for heartburn. , Disp: , Rfl:   Past Medical History: Past Medical History:  Diagnosis Date  . Angina pectoris ([place])   . Arthritis   . CAD, multiple vessel   . Cancer (HCC)    18 MONTHS  BASAL CELL   . Chest pain ON EXERTION   . Dyspnea    W/ EXERTION   . GERD (gastroesophageal reflux disease)   . H/O echocardiogram 1990   NORMAL  . Hyperlipidemia   . Pre-diabetes     Tobacco Use: Social History   Tobacco Use  Smoking Status Never Smoker  Smokeless Tobacco Never Used    Labs: Recent Review Flowsheet Data    Labs for ITP Cardiac and Pulmonary Rehab Latest Ref Rng & Units 04/09/2017 04/09/2017 04/09/2017 04/09/2017 04/09/2017   Hemoglobin A1c 4.8 - 5.6 % - - - - -   PHART 7.350 -  7.450 7.279(L) 7.328(L) 7.309(L) - 7.347(L)   PCO2ART 32.0 - 48.0 mmHg 43.8 48.8(H) 53.6(H) - 46.4   HCO3 20.0 - 28.0 mmol/L 20.8 25.6 26.8 - 25.2   TCO2 22 - 32 mmol/L 22 27 28 26 27    ACIDBASEDEF 0.0 - 2.0 mmol/L 6.0(H) - - - -   O2SAT % 99.0 98.0 96.0 - 96.0      Capillary Blood Glucose: Lab Results  Component Value Date   GLUCAP 104 (H) 04/14/2017   GLUCAP 161 (H) 04/14/2017   GLUCAP 137 (H) 04/14/2017   GLUCAP 142 (H) 04/13/2017   GLUCAP 154 (H) 04/13/2017     Exercise Target Goals: Date: 06/03/17  Exercise Program Goal: Individual exercise prescription set with THRR, safety & activity barriers. Participant demonstrates ability to understand and report RPE using BORG scale, to self-measure pulse accurately, and to acknowledge the importance of the exercise prescription.  Exercise Prescription Goal: Starting with aerobic activity 30 plus minutes a day, 3 days per week for initial exercise prescription. Provide home exercise prescription and guidelines that participant acknowledges understanding prior to discharge.  Activity Barriers & Risk Stratification: Activity Barriers & Cardiac Risk Stratification - 06/03/17 0825      Activity Barriers & Cardiac Risk Stratification   Activity Barriers  Deconditioning;Muscular  Weakness;Shortness of Breath;Other (comment)    Comments  B leg fatiguea and aches (since starting statin drug)    Cardiac Risk Stratification  High       6 Minute Walk: 6 Minute Walk    Row Name 06/03/17 1023         6 Minute Walk   Phase  Initial     Distance  1651 feet     Walk Time  6 minutes     # of Rest Breaks  0     MPH  3.13     METS  4.16     RPE  13     Perceived Dyspnea   2     VO2 Peak  14.55     Symptoms  Yes (comment)     Comments  SOB +2      Resting HR  68 bpm     Resting BP  106/64     Resting Oxygen Saturation   98 %     Exercise Oxygen Saturation  during 6 min walk  99 %     Max Ex. HR  90 bpm     Max Ex. BP  126/70      2 Minute Post BP  122/78        Oxygen Initial Assessment:   Oxygen Re-Evaluation:   Oxygen Discharge (Final Oxygen Re-Evaluation):   Initial Exercise Prescription: Initial Exercise Prescription - 06/03/17 1000      Date of Initial Exercise RX and Referring Provider   Date  06/03/17    Referring Provider  Nigel Mormon MD      Recumbant Bike   Level  2    RPM  70    Watts  30    Minutes  10    METs  3.35      NuStep   Level  2    SPM  70    Minutes  10    METs  3      Track   Laps  12    Minutes  10    METs  3.07      Prescription Details   Frequency (times per week)  3x    Duration  Progress to 30 minutes of continuous aerobic without signs/symptoms of physical distress      Intensity   THRR 40-80% of Max Heartrate  62-114    Ratings of Perceived Exertion  11-15    Perceived Dyspnea  0-4      Progression   Progression  Continue progressive overload as per policy without signs/symptoms or physical distress.      Resistance Training   Training Prescription  Yes    Weight  2lbs    Reps  10-15       Perform Capillary Blood Glucose checks as needed.  Exercise Prescription Changes:   Exercise Comments:   Exercise Goals and Review:  Exercise Goals    Row Name 06/03/17 0826             Exercise Goals   Increase Physical Activity  Yes       Intervention  Provide advice, education, support and counseling about physical activity/exercise needs.;Develop an individualized exercise prescription for aerobic and resistive training based on initial evaluation findings, risk stratification, comorbidities and participant's personal goals.       Expected Outcomes  Achievement of increased cardiorespiratory fitness and enhanced flexibility, muscular endurance and strength shown through measurements of functional capacity and personal statement  of participant.       Increase Strength and Stamina  Yes increase walking tolerance       Intervention   Provide advice, education, support and counseling about physical activity/exercise needs.;Develop an individualized exercise prescription for aerobic and resistive training based on initial evaluation findings, risk stratification, comorbidities and participant's personal goals.       Expected Outcomes  Achievement of increased cardiorespiratory fitness and enhanced flexibility, muscular endurance and strength shown through measurements of functional capacity and personal statement of participant.       Able to understand and use rate of perceived exertion (RPE) scale  Yes       Intervention  Provide education and explanation on how to use RPE scale       Expected Outcomes  Short Term: Able to use RPE daily in rehab to express subjective intensity level;Long Term:  Able to use RPE to guide intensity level when exercising independently       Knowledge and understanding of Target Heart Rate Range (THRR)  Yes       Intervention  Provide education and explanation of THRR including how the numbers were predicted and where they are located for reference       Expected Outcomes  Short Term: Able to state/look up THRR;Long Term: Able to use THRR to govern intensity when exercising independently;Short Term: Able to use daily as guideline for intensity in rehab       Able to check pulse independently  Yes       Intervention  Provide education and demonstration on how to check pulse in carotid and radial arteries.;Review the importance of being able to check your own pulse for safety during independent exercise       Expected Outcomes  Short Term: Able to explain why pulse checking is important during independent exercise;Long Term: Able to check pulse independently and accurately       Understanding of Exercise Prescription  Yes       Intervention  Provide education, explanation, and written materials on patient's individual exercise prescription       Expected Outcomes  Short Term: Able to explain program  exercise prescription;Long Term: Able to explain home exercise prescription to exercise independently          Exercise Goals Re-Evaluation :    Discharge Exercise Prescription (Final Exercise Prescription Changes):   Nutrition:  Target Goals: Understanding of nutrition guidelines, daily intake of sodium 1500mg , cholesterol 200mg , calories 30% from fat and 7% or less from saturated fats, daily to have 5 or more servings of fruits and vegetables.  Biometrics: Pre Biometrics - 06/03/17 1025      Pre Biometrics   Height  5\' 1"  (1.549 m)    Weight  152 lb 1.9 oz (69 kg)    Waist Circumference  37 inches    Hip Circumference  42 inches    Waist to Hip Ratio  0.88 %    BMI (Calculated)  28.76    Triceps Skinfold  25 mm    % Body Fat  40 %    Grip Strength  33 kg    Flexibility  13 in    Single Leg Stand  12.84 seconds        Nutrition Therapy Plan and Nutrition Goals:   Nutrition Discharge: Nutrition Scores:   Nutrition Goals Re-Evaluation:   Nutrition Goals Re-Evaluation:   Nutrition Goals Discharge (Final Nutrition Goals Re-Evaluation):   Psychosocial: Target Goals: Acknowledge presence or absence of  significant depression and/or stress, maximize coping skills, provide positive support system. Participant is able to verbalize types and ability to use techniques and skills needed for reducing stress and depression.  Initial Review & Psychosocial Screening: Initial Psych Review & Screening - 06/03/17 1139      Initial Review   Current issues with  None Identified      Family Dynamics   Good Support System?  Yes    Comments  Judith has good family support. A brief assessment reveals no futher interventions are needed at this time.      Barriers   Psychosocial barriers to participate in program  There are no identifiable barriers or psychosocial needs.      Screening Interventions   Interventions  Encouraged to exercise       Quality of Life  Scores: Quality of Life - 06/03/17 1028      Quality of Life Scores   Health/Function Pre  21.43 %    Socioeconomic Pre  28.29 %    Psych/Spiritual Pre  28.29 %    Family Pre  26.4 %    GLOBAL Pre  25.09 %       PHQ-9: Recent Review Flowsheet Data    Depression screen [facility] 2/9 04/20/2017   Decreased Interest 0   Down, Depressed, Hopeless 0   PHQ - 2 Score 0     Interpretation of Total Score  Total Score Depression Severity:  1-4 = Minimal depression, 5-9 = Mild depression, 10-14 = Moderate depression, 15-19 = Moderately severe depression, 20-27 = Severe depression   Psychosocial Evaluation and Intervention:   Psychosocial Re-Evaluation:   Psychosocial Discharge (Final Psychosocial Re-Evaluation):   Vocational Rehabilitation: Provide vocational rehab assistance to qualifying candidates.   Vocational Rehab Evaluation & Intervention: Vocational Rehab - 06/03/17 1135      Initial Vocational Rehab Evaluation & Intervention   Assessment shows need for Vocational Rehabilitation  No Baili is retired and does not need vocational rehab at this time.       Education: Education Goals: Education classes will be provided on a weekly basis, covering required topics. Participant will state understanding/return demonstration of topics presented.  Learning Barriers/Preferences: Learning Barriers/Preferences - 06/03/17 6967      Learning Barriers/Preferences   Learning Barriers  Sight    Learning Preferences  Verbal Instruction;Skilled Demonstration;Written Material;Video;Pictoral       Education Topics: Count Your Pulse:  -Group instruction provided by verbal instruction, demonstration, patient participation and written materials to support subject.  Instructors address importance of being able to find your pulse and how to count your pulse when at home without a heart monitor.  Patients get hands on experience counting their pulse with staff help and individually.   Heart  Attack, Angina, and Risk Factor Modification:  -Group instruction provided by verbal instruction, video, and written materials to support subject.  Instructors address signs and symptoms of angina and heart attacks.    Also discuss risk factors for heart disease and how to make changes to improve heart health risk factors.   Functional Fitness:  -Group instruction provided by verbal instruction, demonstration, patient participation, and written materials to support subject.  Instructors address safety measures for doing things around the house.  Discuss how to get up and down off the floor, how to pick things up properly, how to safely get out of a chair without assistance, and balance training.   Meditation and Mindfulness:  -Group instruction provided by verbal instruction, patient participation, and written  materials to support subject.  Instructor addresses importance of mindfulness and meditation practice to help reduce stress and improve awareness.  Instructor also leads participants through a meditation exercise.    Stretching for Flexibility and Mobility:  -Group instruction provided by verbal instruction, patient participation, and written materials to support subject.  Instructors lead participants through series of stretches that are designed to increase flexibility thus improving mobility.  These stretches are additional exercise for major muscle groups that are typically performed during regular warm up and cool down.   Hands Only CPR:  -Group verbal, video, and participation provides a basic overview of AHA guidelines for community CPR. Role-play of emergencies allow participants the opportunity to practice calling for help and chest compression technique with discussion of AED use.   Hypertension: -Group verbal and written instruction that provides a basic overview of hypertension including the most recent diagnostic guidelines, risk factor reduction with self-care instructions  and medication management.    Nutrition I class: Heart Healthy Eating:  -Group instruction provided by PowerPoint slides, verbal discussion, and written materials to support subject matter. The instructor gives an explanation and review of the Therapeutic Lifestyle Changes diet recommendations, which includes a discussion on lipid goals, dietary fat, sodium, fiber, plant stanol/sterol esters, sugar, and the components of a well-balanced, healthy diet.   Nutrition II class: Lifestyle Skills:  -Group instruction provided by PowerPoint slides, verbal discussion, and written materials to support subject matter. The instructor gives an explanation and review of label reading, grocery shopping for heart health, heart healthy recipe modifications, and ways to make healthier choices when eating out.   Diabetes Question & Answer:  -Group instruction provided by PowerPoint slides, verbal discussion, and written materials to support subject matter. The instructor gives an explanation and review of diabetes co-morbidities, pre- and post-prandial blood glucose goals, pre-exercise blood glucose goals, signs, symptoms, and treatment of hypoglycemia and hyperglycemia, and foot care basics.   Diabetes Blitz:  -Group instruction provided by PowerPoint slides, verbal discussion, and written materials to support subject matter. The instructor gives an explanation and review of the physiology behind type 1 and type 2 diabetes, diabetes medications and rational behind using different medications, pre- and post-prandial blood glucose recommendations and Hemoglobin A1c goals, diabetes diet, and exercise including blood glucose guidelines for exercising safely.    Portion Distortion:  -Group instruction provided by PowerPoint slides, verbal discussion, written materials, and food models to support subject matter. The instructor gives an explanation of serving size versus portion size, changes in portions sizes over the  last 20 years, and what consists of a serving from each food group.   Stress Management:  -Group instruction provided by verbal instruction, video, and written materials to support subject matter.  Instructors review role of stress in heart disease and how to cope with stress positively.     Exercising on Your Own:  -Group instruction provided by verbal instruction, power point, and written materials to support subject.  Instructors discuss benefits of exercise, components of exercise, frequency and intensity of exercise, and end points for exercise.  Also discuss use of nitroglycerin and activating EMS.  Review options of places to exercise outside of rehab.  Review guidelines for sex with heart disease.   Cardiac Drugs I:  -Group instruction provided by verbal instruction and written materials to support subject.  Instructor reviews cardiac drug classes: antiplatelets, anticoagulants, beta blockers, and statins.  Instructor discusses reasons, side effects, and lifestyle considerations for each drug class.   Cardiac  Drugs II:  -Group instruction provided by verbal instruction and written materials to support subject.  Instructor reviews cardiac drug classes: angiotensin converting enzyme inhibitors (ACE-I), angiotensin II receptor blockers (ARBs), nitrates, and calcium channel blockers.  Instructor discusses reasons, side effects, and lifestyle considerations for each drug class.   Anatomy and Physiology of the Circulatory System:  Group verbal and written instruction and models provide basic cardiac anatomy and physiology, with the coronary electrical and arterial systems. Review of: AMI, Angina, Valve disease, Heart Failure, Peripheral Artery Disease, Cardiac Arrhythmia, Pacemakers, and the ICD.   Other Education:  -Group or individual verbal, written, or video instructions that support the educational goals of the cardiac rehab program.   Knowledge Questionnaire Score: Knowledge  Questionnaire Score - 06/03/17 1026      Knowledge Questionnaire Score   Pre Score  17/24       Core Components/Risk Factors/Patient Goals at Admission: Personal Goals and Risk Factors at Admission - 06/03/17 1150      Core Components/Risk Factors/Patient Goals on Admission    Weight Management  Yes;Weight Loss    Intervention  Weight Management: Develop a combined nutrition and exercise program designed to reach desired caloric intake, while maintaining appropriate intake of nutrient and fiber, sodium and fats, and appropriate energy expenditure required for the weight goal.;Weight Management: Provide education and appropriate resources to help participant work on and attain dietary goals.;Weight Management/Obesity: Establish reasonable short term and long term weight goals.;Obesity: Provide education and appropriate resources to help participant work on and attain dietary goals.    Admit Weight  152 lb 1.9 oz (69 kg)    Goal Weight: Short Term  147 lb (66.7 kg)    Goal Weight: Long Term  142 lb (64.4 kg)    Expected Outcomes  Short Term: Continue to assess and modify interventions until short term weight is achieved;Long Term: Adherence to nutrition and physical activity/exercise program aimed toward attainment of established weight goal;Weight Loss: Understanding of general recommendations for a balanced deficit meal plan, which promotes 1-2 lb weight loss per week and includes a negative energy balance of [phone] kcal/d;Understanding recommendations for meals to include 15-35% energy as protein, 25-35% energy from fat, 35-60% energy from carbohydrates, less than 200mg  of dietary cholesterol, 20-35 gm of total fiber daily;Understanding of distribution of calorie intake throughout the day with the consumption of 4-5 meals/snacks;Weight Maintenance: Understanding of the daily nutrition guidelines, which includes 25-35% calories from fat, 7% or less cal from saturated fats, less than 200mg   cholesterol, less than 1.5gm of sodium, & 5 or more servings of fruits and vegetables daily    Diabetes  Yes    Intervention  Provide education about signs/symptoms and action to take for hypo/hyperglycemia.;Provide education about proper nutrition, including hydration, and aerobic/resistive exercise prescription along with prescribed medications to achieve blood glucose in normal ranges: Fasting glucose 65-99 mg/dL    Expected Outcomes  Short Term: Participant verbalizes understanding of the signs/symptoms and immediate care of hyper/hypoglycemia, proper foot care and importance of medication, aerobic/resistive exercise and nutrition plan for blood glucose control.;Long Term: Attainment of HbA1C < 7%.    Lipids  Yes    Intervention  Provide education and support for participant on nutrition & aerobic/resistive exercise along with prescribed medications to achieve LDL 70mg , HDL >40mg .    Expected Outcomes  Short Term: Participant states understanding of desired cholesterol values and is compliant with medications prescribed. Participant is following exercise prescription and nutrition guidelines.;Long Term: Cholesterol controlled with medications  as prescribed, with individualized exercise RX and with personalized nutrition plan. Value goals: LDL < 70mg , HDL > 40 mg.       Core Components/Risk Factors/Patient Goals Review:    Core Components/Risk Factors/Patient Goals at Discharge (Final Review):    ITP Comments: ITP Comments    Row Name 06/03/17 0823           ITP Comments  Dr. Fransico Him, Medical Director          Comments: Terin attended orientation from [place] to [phone] to review rules and guidelines for program. Completed 6 minute walk test, Intitial ITP, and exercise prescription.  VSS. Telemetry-Sinus Rhtyhm.  Asymptomatic.Barnet Pall, RN,BSN 06/03/2017 11:56 AM

## 2017-06-04 NOTE — Progress Notes (Signed)
Christine [name] 67 y.o. female DOB: 09-18-1950 MRN: [id number]      Nutrition Note  1. S/P CABG x 5 04/09/2017    Past Medical History:  Diagnosis Date  . Angina pectoris ([place])   . Arthritis   . CAD, multiple vessel   . Cancer (HCC)    18 MONTHS  BASAL CELL   . Chest pain ON EXERTION   . Dyspnea    W/ EXERTION   . GERD (gastroesophageal reflux disease)   . H/O echocardiogram 1990   NORMAL  . Hyperlipidemia   . Pre-diabetes    Meds reviewed. Metformin noted  HT: Ht Readings from Last 1 Encounters:  06/03/17 5\' 1"  (1.549 m)    WT: Wt Readings from Last 3 Encounters:  06/03/17 152 lb 1.9 oz (69 kg)  05/04/17 145 lb (65.8 kg)  04/20/17 148 lb (67.1 kg)     BMI 26.5   Current tobacco use? No   Labs:  Lipid Panel  No results found for: CHOL, TRIG, HDL, CHOLHDL, VLDL, LDLCALC, LDLDIRECT  Lab Results  Component Value Date   HGBA1C 9.0 (H) 04/07/2017   CBG (last 3)  No results for input(s): GLUCAP in the last 72 hours.  Nutrition Note Spoke with pt. Nutrition plan and goals reviewed with pt. Pt is following a heart healthy diet. Pt wants to lose wt. Pt is a newly dx diabetic. Pt checks CBG's once daily. Fasting CBG's reportedly 140-160 mg/dL. Recommended fasting CBG's reviewed. Pt expressed understanding of the information reviewed. Pt aware of nutrition education classes offered.  Nutrition Diagnosis ? Food-and nutrition-related knowledge deficit related to lack of exposure to information as related to diagnosis of: ? CVD ? DM ? Overweight related to excessive energy intake as evidenced by a BMI of 26.5  Nutrition Intervention ? Pt's individual nutrition plan and goals reviewed with pt. ? Pt given handouts for: ? Nutrition I class ? Nutrition II class ? Diabetes Blitz Class   Nutrition Goal(s):  ? Pt to identify food quantities necessary to achieve weight loss of 6-24 lb (2.7-10.9 kg) at graduation from cardiac rehab. ? Pt to have a better understanding of  diabetes including how to manage diet and blood glucose levels. ? Improved blood glucose control as evidenced by pt's A1c trending from 9.0 toward less than 7.0.  Plan:  Pt to attend nutrition classes ? Portion Distortion ? Diabetes Q & A Will provide client-centered nutrition education as part of interdisciplinary care.   Monitor and evaluate progress toward nutrition goal with team.  Derek Mound, M.Ed, RD, LDN, CDE 06/04/2017 8:37 AM

## 2017-06-08 DIAGNOSIS — R002 Palpitations: Secondary | ICD-10-CM | POA: Diagnosis not present

## 2017-06-09 ENCOUNTER — Encounter (HOSPITAL_COMMUNITY): Payer: Self-pay

## 2017-06-09 ENCOUNTER — Encounter (HOSPITAL_COMMUNITY)
Admission: RE | Admit: 2017-06-09 | Discharge: 2017-06-09 | Disposition: A | Discharge status: Home / Self Care | Payer: Medicare HMO | Source: Ambulatory Visit | Attending: Cardiology | Admitting: Cardiology

## 2017-06-09 DIAGNOSIS — Z951 Presence of aortocoronary bypass graft: Secondary | ICD-10-CM | POA: Diagnosis not present

## 2017-06-09 DIAGNOSIS — Z48812 Encounter for surgical aftercare following surgery on the circulatory system: Secondary | ICD-10-CM | POA: Diagnosis present

## 2017-06-09 LAB — GLUCOSE, CAPILLARY: GLUCOSE-CAPILLARY: 154 mg/dL — AB (ref 65–99)

## 2017-06-09 NOTE — Progress Notes (Signed)
Daily Session Note  Patient Details  Name: Christine [name] MRN: [id number] Date of Birth: [birth year] Referring Provider:     CARDIAC REHAB PHASE II ORIENTATION from 06/03/2017 in [place]  Referring Provider  Nigel Mormon MD      Encounter Date: 06/09/2017  Check In: Session Check In - 06/09/17 0814      Check-In   Location  [facility]    Staff Present  Dorna Bloom, MS, ACSM RCEP, Exercise Physiologist;Joann Rion, RN, BSN;Tyara Nevels, MS,ACSM CEP, Exercise Physiologist;Olinty Fenton, MS, ACSM CEP, Exercise Physiologist    Supervising physician immediately available to respond to emergencies  [facility] immediately available    Physician(s)  Dr.Gherghe    Medication changes reported      No    Fall or balance concerns reported     No    Tobacco Cessation  No Change    Warm-up and Cool-down  Performed as group-led instruction    Resistance Training Performed  No    VAD Patient?  No      Pain Assessment   Currently in Pain?  No/denies       Capillary Blood Glucose: Results for orders placed or performed during the hospital encounter of 06/09/17 (from the past 24 hour(s))  Glucose, capillary     Status: Abnormal   Collection Time: 06/09/17  9:18 AM  Result Value Ref Range   Glucose-Capillary 154 (H) 65 - 99 mg/dL      Social History   Tobacco Use  Smoking Status Never Smoker  Smokeless Tobacco Never Used    Goals Met:  Exercise tolerated well  Goals Unmet:  Not Applicable  Comments: Pt started cardiac rehab today.  Pt tolerated light exercise without difficulty. VSS, telemetry-sinus rhythm,  asymptomatic.  Medication list reconciled. Pt denies barriers to medicaiton compliance.  PSYCHOSOCIAL ASSESSMENT:  PHQ-0.  Pt exhibits positive coping skills, hopeful outlook with supportive family. No psychosocial needs identified at this time, no psychosocial interventions necessary.    Pt enjoys spending time  with family  Pt goals for cardiac rehab are to increase strength and stamina.   Pt oriented to exercise equipment and routine.    Understanding verbalized.    Dr. Fransico Him is Medical Director for Cardiac Rehab at [facility].

## 2017-06-11 ENCOUNTER — Encounter (HOSPITAL_COMMUNITY)
Admission: RE | Admit: 2017-06-11 | Discharge: 2017-06-11 | Disposition: A | Discharge status: Home / Self Care | Payer: Medicare HMO | Source: Ambulatory Visit | Attending: Cardiology | Admitting: Cardiology

## 2017-06-11 DIAGNOSIS — Z951 Presence of aortocoronary bypass graft: Secondary | ICD-10-CM

## 2017-06-11 DIAGNOSIS — Z48812 Encounter for surgical aftercare following surgery on the circulatory system: Secondary | ICD-10-CM | POA: Diagnosis not present

## 2017-06-14 ENCOUNTER — Encounter (HOSPITAL_COMMUNITY): Payer: Medicare HMO

## 2017-06-15 ENCOUNTER — Other Ambulatory Visit: Payer: Self-pay | Admitting: *Deleted

## 2017-06-15 NOTE — Patient Outreach (Signed)
[place] [facility]) Care Management  06/15/2017  JASIYA MARKIE 1950/12/06 [id number]  Telephone follow up for 60 day follow up.  Unsuccessful attempt to contact patient, attempted home and mobile phone, able to leave a HIPAA compliant message requesting a return call.  Plan  Will await return call, if no response will attempt call in the next week.    Joylene Draft, RN, [place] Management Coordinator  [phone]- Mobile [phone]- Toll Free Main Office

## 2017-06-16 ENCOUNTER — Encounter (HOSPITAL_COMMUNITY)
Admission: RE | Admit: 2017-06-16 | Discharge: 2017-06-16 | Disposition: A | Discharge status: Home / Self Care | Payer: Medicare HMO | Source: Ambulatory Visit | Attending: Cardiology | Admitting: Cardiology

## 2017-06-16 DIAGNOSIS — Z48812 Encounter for surgical aftercare following surgery on the circulatory system: Secondary | ICD-10-CM | POA: Diagnosis not present

## 2017-06-16 DIAGNOSIS — Z951 Presence of aortocoronary bypass graft: Secondary | ICD-10-CM

## 2017-06-16 LAB — GLUCOSE, CAPILLARY
Glucose-Capillary: 138 mg/dL — ABNORMAL HIGH (ref 65–99)
Glucose-Capillary: 155 mg/dL — ABNORMAL HIGH (ref 65–99)

## 2017-06-18 ENCOUNTER — Encounter (HOSPITAL_COMMUNITY)
Admission: RE | Admit: 2017-06-18 | Discharge: 2017-06-18 | Disposition: A | Discharge status: Home / Self Care | Payer: Medicare HMO | Source: Ambulatory Visit | Attending: Cardiology | Admitting: Cardiology

## 2017-06-18 DIAGNOSIS — Z951 Presence of aortocoronary bypass graft: Secondary | ICD-10-CM

## 2017-06-18 DIAGNOSIS — Z48812 Encounter for surgical aftercare following surgery on the circulatory system: Secondary | ICD-10-CM | POA: Diagnosis not present

## 2017-06-18 NOTE — Progress Notes (Signed)
Christine [name] 67 y.o. female DOB: Aug 12, 1950 MRN: [id number]      Nutrition Note  1. 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 Metformin noted  Lab Results  Component Value Date   HGBA1C 9.0 (H) 04/07/2017   Nutrition Note Spoke with pt. Pt c/o nausea 2-3 days/week and occasional diarrhea on Metformin. Pt is "supposed to increase to 1000 mg Metformin twice a day once nausea resolves." Pt expressed understanding of the information reviewed. Pt aware of nutrition education classes offered.  Nutrition Diagnosis ? Food-and nutrition-related knowledge deficit related to lack of exposure to information as related to diagnosis of: ? CVD ? DM ? Overweight related to excessive energy intake as evidenced by a BMI of 26.5  Nutrition Intervention ? Will fax Dr. Shelia Media re: ? Change to Metformin XR  ? Pt's individual nutrition plan reviewed with pt.  Nutrition Goal(s):  ? Pt to identify food quantities necessary to achieve weight loss of 6-24 lb (2.7-10.9 kg) at graduation from cardiac rehab. ? Pt to have a better understanding of diabetes including how to manage diet and blood glucose levels. ? Improved blood glucose control as evidenced by pt's A1c trending from 9.0 toward less than 7.0.  Plan:  Pt to attend nutrition classes ? Portion Distortion ? Diabetes Q & A - met 06/18/17 Will provide client-centered nutrition education as part of interdisciplinary care.   Monitor and evaluate progress toward nutrition goal with team.  Derek Mound, M.Ed, RD, LDN, CDE 06/18/2017 12:05 PM

## 2017-06-21 ENCOUNTER — Encounter (HOSPITAL_COMMUNITY): Payer: Medicare HMO

## 2017-06-21 ENCOUNTER — Other Ambulatory Visit: Payer: Self-pay | Admitting: *Deleted

## 2017-06-21 ENCOUNTER — Encounter: Payer: Self-pay | Admitting: *Deleted

## 2017-06-21 NOTE — Patient Outreach (Signed)
[place] [facility]) Care Management  06/21/2017  BERLINDA FARVE 18-Feb-1951 [id number]   Telephone follow up call  Discharged to home on 11/14, after CABG surgery, recent diagnosis of Diabetes.  Successful outreach call to patient , HIPAA information verified.  Patient reports she is doing well, discussed participating in outpatient cardiac rehab.   Patient discussed :  Recent Cardiac Surgery Incisions healed well per report ,  tolerating increasing activity, continues to take medications as prescribed.   Diabetes Reports she is checking her  blood sugar daily, today's reading 180 which she states is higher than her usual daily reading. Patient denies episode of low blood sugar, is able to recall steps to treating low blood sugar.  Patient discussed she is watching her diet as per educated and  has attended diabetes education class recently. A1c 8.4 on 11/19  Discussed with patient she has completed 60 day complex care management goals.  Patient denies any other concerns at this time, declined need for home visit or follow up, discussed health coach telephonic phone she declines also.  Reinforced continuing to take medications as prescribed, attending all MD appointments, staying active as tolerated and continuing to focus on diet modifications .  Verified patient has [facility] contact information for future needs. Patient did verify her insurance has changed to [employer] .   Plan Will close [facility] care management case,patient care  goals have been met. Will send MD case closure letter   Joylene Draft, RN, [place] Management Coordinator  [phone]- Mobile [phone]- Toll Free Main Office

## 2017-06-22 DIAGNOSIS — R69 Illness, unspecified: Secondary | ICD-10-CM | POA: Diagnosis not present

## 2017-06-23 ENCOUNTER — Encounter (HOSPITAL_COMMUNITY)
Admission: RE | Admit: 2017-06-23 | Discharge: 2017-06-23 | Disposition: A | Discharge status: Home / Self Care | Payer: Medicare HMO | Source: Ambulatory Visit | Attending: Cardiology | Admitting: Cardiology

## 2017-06-23 DIAGNOSIS — Z951 Presence of aortocoronary bypass graft: Secondary | ICD-10-CM

## 2017-06-23 DIAGNOSIS — Z48812 Encounter for surgical aftercare following surgery on the circulatory system: Secondary | ICD-10-CM | POA: Diagnosis not present

## 2017-06-23 NOTE — Progress Notes (Signed)
Cardiac Individual Treatment Plan  Patient Details  Name: Christine [name] MRN: [id number] Date of Birth: 10/03/1950 Referring Provider:     CARDIAC REHAB PHASE II ORIENTATION from 06/03/2017 in [facility]  Referring Provider  Nigel Mormon MD      Initial Encounter Date:    CARDIAC REHAB PHASE II ORIENTATION from 06/03/2017 in [facility]  Date  06/03/17  Referring Provider  Nigel Mormon MD      Visit Diagnosis: S/P CABG x 5 04/09/2017  Patient's Home Medications on Admission:  Current Outpatient Medications:  .  aspirin EC 81 MG tablet, Take 81 mg by mouth daily., Disp: , Rfl:  .  atorvastatin (LIPITOR) 20 MG tablet, Take 20 mg by mouth daily., Disp: , Rfl:  .  metFORMIN (GLUCOPHAGE) 1000 MG tablet, Take 1 tablet (1,000 mg total) 2 (two) times daily with a meal by mouth., Disp: 60 tablet, Rfl: 1 .  metoprolol succinate (TOPROL-XL) 25 MG 24 hr tablet, Take 1 tablet (25 mg total) by mouth 2 (two) times daily., Disp: 60 tablet, Rfl: 3 .  Multiple Vitamins-Minerals (MULTIVITAMIN WITH MINERALS) tablet, Take 1 tablet by mouth daily., Disp: , Rfl:  .  pantoprazole (PROTONIX) 40 MG tablet, Take 40 mg daily as needed by mouth for heartburn. , Disp: , Rfl:   Past Medical History: Past Medical History:  Diagnosis Date  . Angina pectoris ([place])   . Arthritis   . CAD, multiple vessel   . Cancer ([facility])    18 MONTHS  BASAL CELL   . Chest pain ON EXERTION   . Dyspnea    W/ EXERTION   . GERD (gastroesophageal reflux disease)   . H/O echocardiogram 1990   NORMAL  . Hyperlipidemia   . Pre-diabetes     Tobacco Use: Social History   Tobacco Use  Smoking Status Never Smoker  Smokeless Tobacco Never Used    Labs: Recent Review Flowsheet Data    Labs for ITP Cardiac and Pulmonary Rehab Latest Ref Rng & Units 04/09/2017 04/09/2017 04/09/2017 04/09/2017 04/09/2017   Hemoglobin A1c 4.8 - 5.6 % - - - - -   PHART 7.350 -  7.450 7.279(L) 7.328(L) 7.309(L) - 7.347(L)   PCO2ART 32.0 - 48.0 mmHg 43.8 48.8(H) 53.6(H) - 46.4   HCO3 20.0 - 28.0 mmol/L 20.8 25.6 26.8 - 25.2   TCO2 22 - 32 mmol/L 22 27 28 26 27    ACIDBASEDEF 0.0 - 2.0 mmol/L 6.0(H) - - - -   O2SAT % 99.0 98.0 96.0 - 96.0      Capillary Blood Glucose: Lab Results  Component Value Date   GLUCAP 138 (H) 06/16/2017   GLUCAP 155 (H) 06/16/2017   GLUCAP 154 (H) 06/09/2017   GLUCAP 104 (H) 04/14/2017   GLUCAP 161 (H) 04/14/2017     Exercise Target Goals:    Exercise Program Goal: Individual exercise prescription set using results from initial 6 min walk test and THRR while considering  patient's activity barriers and safety.   Exercise Prescription Goal: Initial exercise prescription builds to 30-45 minutes a day of aerobic activity, 2-3 days per week.  Home exercise guidelines will be given to patient during program as part of exercise prescription that the participant will acknowledge.  Activity Barriers & Risk Stratification: Activity Barriers & Cardiac Risk Stratification - 06/03/17 0825      Activity Barriers & Cardiac Risk Stratification   Activity Barriers  Deconditioning;Muscular Weakness;Shortness of Breath;Other (comment)  Comments  B leg fatiguea and aches (since starting statin drug)    Cardiac Risk Stratification  High       6 Minute Walk: 6 Minute Walk    Row Name 06/03/17 1023         6 Minute Walk   Phase  Initial     Distance  1651 feet     Walk Time  6 minutes     # of Rest Breaks  0     MPH  3.13     METS  4.16     RPE  13     Perceived Dyspnea   2     VO2 Peak  14.55     Symptoms  Yes (comment)     Comments  SOB +2      Resting HR  68 bpm     Resting BP  106/64     Resting Oxygen Saturation   98 %     Exercise Oxygen Saturation  during 6 min walk  99 %     Max Ex. HR  90 bpm     Max Ex. BP  126/70     2 Minute Post BP  122/78        Oxygen Initial Assessment:   Oxygen  Re-Evaluation:   Oxygen Discharge (Final Oxygen Re-Evaluation):   Initial Exercise Prescription: Initial Exercise Prescription - 06/03/17 1000      Date of Initial Exercise RX and Referring Provider   Date  06/03/17    Referring Provider  Nigel Mormon MD      Recumbant Bike   Level  2    RPM  70    Watts  30    Minutes  10    METs  3.35      NuStep   Level  2    SPM  70    Minutes  10    METs  3      Track   Laps  12    Minutes  10    METs  3.07      Prescription Details   Frequency (times per week)  3x    Duration  Progress to 30 minutes of continuous aerobic without signs/symptoms of physical distress      Intensity   THRR 40-80% of Max Heartrate  62-114    Ratings of Perceived Exertion  11-15    Perceived Dyspnea  0-4      Progression   Progression  Continue progressive overload as per policy without signs/symptoms or physical distress.      Resistance Training   Training Prescription  Yes    Weight  2lbs    Reps  10-15       Perform Capillary Blood Glucose checks as needed.  Exercise Prescription Changes: Exercise Prescription Changes    Row Name 06/23/17 1100             Response to Exercise   Blood Pressure (Admit)  100/60       Blood Pressure (Exercise)  110/60       Blood Pressure (Exit)  100/60       Heart Rate (Admit)  65 bpm       Heart Rate (Exercise)  84 bpm       Heart Rate (Exit)  66 bpm       Rating of Perceived Exertion (Exercise)  13       Duration  Continue with 30 min of aerobic  exercise without signs/symptoms of physical distress.       Intensity  THRR unchanged         Progression   Progression  Continue to progress workloads to maintain intensity without signs/symptoms of physical distress.       Average METs  2.6         Resistance Training   Training Prescription  Yes       Weight  2lb        Reps  10-15       Time  10 Minutes         Recumbant Bike   Level  3       RPM  70       Watts  30        Minutes  10       METs  2.5         NuStep   Level  3       SPM  60       Minutes  10       METs  2.4         Track   Laps  11       Minutes  10       METs  2.92          Exercise Comments:   Exercise Goals and Review: Exercise Goals    Row Name 06/03/17 0826             Exercise Goals   Increase Physical Activity  Yes       Intervention  Provide advice, education, support and counseling about physical activity/exercise needs.;Develop an individualized exercise prescription for aerobic and resistive training based on initial evaluation findings, risk stratification, comorbidities and participant's personal goals.       Expected Outcomes  Achievement of increased cardiorespiratory fitness and enhanced flexibility, muscular endurance and strength shown through measurements of functional capacity and personal statement of participant.       Increase Strength and Stamina  Yes increase walking tolerance       Intervention  Provide advice, education, support and counseling about physical activity/exercise needs.;Develop an individualized exercise prescription for aerobic and resistive training based on initial evaluation findings, risk stratification, comorbidities and participant's personal goals.       Expected Outcomes  Achievement of increased cardiorespiratory fitness and enhanced flexibility, muscular endurance and strength shown through measurements of functional capacity and personal statement of participant.       Able to understand and use rate of perceived exertion (RPE) scale  Yes       Intervention  Provide education and explanation on how to use RPE scale       Expected Outcomes  Short Term: Able to use RPE daily in rehab to express subjective intensity level;Long Term:  Able to use RPE to guide intensity level when exercising independently       Knowledge and understanding of Target Heart Rate Range (THRR)  Yes       Intervention  Provide education and explanation of  THRR including how the numbers were predicted and where they are located for reference       Expected Outcomes  Short Term: Able to state/look up THRR;Long Term: Able to use THRR to govern intensity when exercising independently;Short Term: Able to use daily as guideline for intensity in rehab       Able to check pulse independently  Yes  Intervention  Provide education and demonstration on how to check pulse in carotid and radial arteries.;Review the importance of being able to check your own pulse for safety during independent exercise       Expected Outcomes  Short Term: Able to explain why pulse checking is important during independent exercise;Long Term: Able to check pulse independently and accurately       Understanding of Exercise Prescription  Yes       Intervention  Provide education, explanation, and written materials on patient's individual exercise prescription       Expected Outcomes  Short Term: Able to explain program exercise prescription;Long Term: Able to explain home exercise prescription to exercise independently          Exercise Goals Re-Evaluation :    Discharge Exercise Prescription (Final Exercise Prescription Changes): Exercise Prescription Changes - 06/23/17 1100      Response to Exercise   Blood Pressure (Admit)  100/60    Blood Pressure (Exercise)  110/60    Blood Pressure (Exit)  100/60    Heart Rate (Admit)  65 bpm    Heart Rate (Exercise)  84 bpm    Heart Rate (Exit)  66 bpm    Rating of Perceived Exertion (Exercise)  13    Duration  Continue with 30 min of aerobic exercise without signs/symptoms of physical distress.    Intensity  THRR unchanged      Progression   Progression  Continue to progress workloads to maintain intensity without signs/symptoms of physical distress.    Average METs  2.6      Resistance Training   Training Prescription  Yes    Weight  2lb     Reps  10-15    Time  10 Minutes      Recumbant Bike   Level  3    RPM  70     Watts  30    Minutes  10    METs  2.5      NuStep   Level  3    SPM  60    Minutes  10    METs  2.4      Track   Laps  11    Minutes  10    METs  2.92       Nutrition:  Target Goals: Understanding of nutrition guidelines, daily intake of sodium 1500mg , cholesterol 200mg , calories 30% from fat and 7% or less from saturated fats, daily to have 5 or more servings of fruits and vegetables.  Biometrics: Pre Biometrics - 06/03/17 1025      Pre Biometrics   Height  5\' 1"  (1.549 m)    Weight  152 lb 1.9 oz (69 kg)    Waist Circumference  37 inches    Hip Circumference  42 inches    Waist to Hip Ratio  0.88 %    BMI (Calculated)  28.76    Triceps Skinfold  25 mm    % Body Fat  40 %    Grip Strength  33 kg    Flexibility  13 in    Single Leg Stand  12.84 seconds        Nutrition Therapy Plan and Nutrition Goals: Nutrition Therapy & Goals - 06/04/17 0849      Nutrition Therapy   Diet  Carb Modified, Heart Healthy      Personal Nutrition Goals   Nutrition Goal  Pt to identify food quantities necessary to achieve weight loss of  6-24 lb (2.7-10.9 kg) at graduation from cardiac rehab.    Personal Goal #2  Pt to have a better understanding of diabetes including how to manage diet and blood glucose levels.    Personal Goal #3  Improved blood glucose control as evidenced by pt's A1c trending from 9.0 toward less than 7.0.      Intervention Plan   Intervention  Prescribe, educate and counsel regarding individualized specific dietary modifications aiming towards targeted core components such as weight, hypertension, lipid management, diabetes, heart failure and other comorbidities.    Expected Outcomes  Short Term Goal: Understand basic principles of dietary content, such as calories, fat, sodium, cholesterol and nutrients.;Long Term Goal: Adherence to prescribed nutrition plan.       Nutrition Assessments: Nutrition Assessments - 06/04/17 0850      MEDFICTS Scores   Pre  Score  32       Nutrition Goals Re-Evaluation:   Nutrition Goals Re-Evaluation:   Nutrition Goals Discharge (Final Nutrition Goals Re-Evaluation):   Psychosocial: Target Goals: Acknowledge presence or absence of significant depression and/or stress, maximize coping skills, provide positive support system. Participant is able to verbalize types and ability to use techniques and skills needed for reducing stress and depression.  Initial Review & Psychosocial Screening: Initial Psych Review & Screening - 06/03/17 1139      Initial Review   Current issues with  None Identified      Family Dynamics   Good Support System?  Yes    Comments  Orly has good family support. A brief assessment reveals no futher interventions are needed at this time.      Barriers   Psychosocial barriers to participate in program  There are no identifiable barriers or psychosocial needs.      Screening Interventions   Interventions  Encouraged to exercise       Quality of Life Scores: Quality of Life - 06/03/17 1028      Quality of Life Scores   Health/Function Pre  21.43 %    Socioeconomic Pre  28.29 %    Psych/Spiritual Pre  28.29 %    Family Pre  26.4 %    GLOBAL Pre  25.09 %      Scores of 19 and below usually indicate a poorer quality of life in these areas.  A difference of  2-3 points is a clinically meaningful difference.  A difference of 2-3 points in the total score of the Quality of Life Index has been associated with significant improvement in overall quality of life, self-image, physical symptoms, and general health in studies assessing change in quality of life.  PHQ-9: Recent Review Flowsheet Data    Depression screen [facility] 2/9 06/09/2017 04/20/2017   Decreased Interest 0 0   Down, Depressed, Hopeless 0 0   PHQ - 2 Score 0 0     Interpretation of Total Score  Total Score Depression Severity:  1-4 = Minimal depression, 5-9 = Mild depression, 10-14 = Moderate depression, 15-19 =  Moderately severe depression, 20-27 = Severe depression   Psychosocial Evaluation and Intervention: Psychosocial Evaluation - 06/09/17 0945      Psychosocial Evaluation & Interventions   Interventions  Encouraged to exercise with the program and follow exercise prescription    Comments  no psychosocial needs identified, no interventions necessary.     Expected Outcomes  pt will exhibit positive outlook with good coping skills.     Continue Psychosocial Services   No Follow up required  Psychosocial Re-Evaluation: Psychosocial Re-Evaluation    [place] Name 06/23/17 1646             Psychosocial Re-Evaluation   Current issues with  None Identified       Comments  no psychosocial needs identified, no interventions necessary        Expected Outcomes  pt will exhibit positive outlook with good coping skills.        Interventions  Encouraged to attend Cardiac Rehabilitation for the exercise;Stress management education;Relaxation education       Continue Psychosocial Services   No Follow up required          Psychosocial Discharge (Final Psychosocial Re-Evaluation): Psychosocial Re-Evaluation - 06/23/17 1646      Psychosocial Re-Evaluation   Current issues with  None Identified    Comments  no psychosocial needs identified, no interventions necessary     Expected Outcomes  pt will exhibit positive outlook with good coping skills.     Interventions  Encouraged to attend Cardiac Rehabilitation for the exercise;Stress management education;Relaxation education    Continue Psychosocial Services   No Follow up required       Vocational Rehabilitation: Provide vocational rehab assistance to qualifying candidates.   Vocational Rehab Evaluation & Intervention: Vocational Rehab - 06/03/17 1135      Initial Vocational Rehab Evaluation & Intervention   Assessment shows need for Vocational Rehabilitation  No Hallee is retired and does not need vocational rehab at this time.        Education: Education Goals: Education classes will be provided on a weekly basis, covering required topics. Participant will state understanding/return demonstration of topics presented.  Learning Barriers/Preferences: Learning Barriers/Preferences - 06/03/17 7341      Learning Barriers/Preferences   Learning Barriers  Sight    Learning Preferences  Verbal Instruction;Skilled Demonstration;Written Material;Video;Pictoral       Education Topics: Count Your Pulse:  -Group instruction provided by verbal instruction, demonstration, patient participation and written materials to support subject.  Instructors address importance of being able to find your pulse and how to count your pulse when at home without a heart monitor.  Patients get hands on experience counting their pulse with staff help and individually.   Heart Attack, Angina, and Risk Factor Modification:  -Group instruction provided by verbal instruction, video, and written materials to support subject.  Instructors address signs and symptoms of angina and heart attacks.    Also discuss risk factors for heart disease and how to make changes to improve heart health risk factors.   Functional Fitness:  -Group instruction provided by verbal instruction, demonstration, patient participation, and written materials to support subject.  Instructors address safety measures for doing things around the house.  Discuss how to get up and down off the floor, how to pick things up properly, how to safely get out of a chair without assistance, and balance training.   Meditation and Mindfulness:  -Group instruction provided by verbal instruction, patient participation, and written materials to support subject.  Instructor addresses importance of mindfulness and meditation practice to help reduce stress and improve awareness.  Instructor also leads participants through a meditation exercise.    Stretching for Flexibility and Mobility:  -Group  instruction provided by verbal instruction, patient participation, and written materials to support subject.  Instructors lead participants through series of stretches that are designed to increase flexibility thus improving mobility.  These stretches are additional exercise for major muscle groups that are typically performed during regular warm up and  cool down.   Hands Only CPR:  -Group verbal, video, and participation provides a basic overview of AHA guidelines for community CPR. Role-play of emergencies allow participants the opportunity to practice calling for help and chest compression technique with discussion of AED use.   Hypertension: -Group verbal and written instruction that provides a basic overview of hypertension including the most recent diagnostic guidelines, risk factor reduction with self-care instructions and medication management.   CARDIAC REHAB PHASE II EXERCISE from 06/18/2017 in [place]  Date  06/11/17  Instruction Review Code  2- meets goals/outcomes       Nutrition I class: Heart Healthy Eating:  -Group instruction provided by PowerPoint slides, verbal discussion, and written materials to support subject matter. The instructor gives an explanation and review of the Therapeutic Lifestyle Changes diet recommendations, which includes a discussion on lipid goals, dietary fat, sodium, fiber, plant stanol/sterol esters, sugar, and the components of a well-balanced, healthy diet.   CARDIAC REHAB PHASE II EXERCISE from 06/18/2017 in [place]  Date  06/03/17  Educator  RD  Instruction Review Code  Not applicable      Nutrition II class: Lifestyle Skills:  -Group instruction provided by PowerPoint slides, verbal discussion, and written materials to support subject matter. The instructor gives an explanation and review of label reading, grocery shopping for heart health, heart healthy recipe modifications,  and ways to make healthier choices when eating out.   CARDIAC REHAB PHASE II EXERCISE from 06/18/2017 in [place]  Date  06/03/17  Educator  RD  Instruction Review Code  Not applicable      Diabetes Question & Answer:  -Group instruction provided by PowerPoint slides, verbal discussion, and written materials to support subject matter. The instructor gives an explanation and review of diabetes co-morbidities, pre- and post-prandial blood glucose goals, pre-exercise blood glucose goals, signs, symptoms, and treatment of hypoglycemia and hyperglycemia, and foot care basics.   CARDIAC REHAB PHASE II EXERCISE from 06/18/2017 in [place]  Date  06/18/17  Educator  RD  Instruction Review Code  2- meets goals/outcomes      Diabetes Blitz:  -Group instruction provided by PowerPoint slides, verbal discussion, and written materials to support subject matter. The instructor gives an explanation and review of the physiology behind type 1 and type 2 diabetes, diabetes medications and rational behind using different medications, pre- and post-prandial blood glucose recommendations and Hemoglobin A1c goals, diabetes diet, and exercise including blood glucose guidelines for exercising safely.    CARDIAC REHAB PHASE II EXERCISE from 06/18/2017 in [place]  Date  06/03/17  Educator  RD  Instruction Review Code  Not applicable      Portion Distortion:  -Group instruction provided by PowerPoint slides, verbal discussion, written materials, and food models to support subject matter. The instructor gives an explanation of serving size versus portion size, changes in portions sizes over the last 20 years, and what consists of a serving from each food group.   Stress Management:  -Group instruction provided by verbal instruction, video, and written materials to support subject matter.  Instructors review role of  stress in heart disease and how to cope with stress positively.     Exercising on Your Own:  -Group instruction provided by verbal instruction, power point, and written materials to support subject.  Instructors discuss benefits of exercise, components of exercise, frequency and intensity  of exercise, and end points for exercise.  Also discuss use of nitroglycerin and activating EMS.  Review options of places to exercise outside of rehab.  Review guidelines for sex with heart disease.   CARDIAC REHAB PHASE II EXERCISE from 06/18/2017 in [facility]  Date  06/16/17  Instruction Review Code  2- meets goals/outcomes      Cardiac Drugs I:  -Group instruction provided by verbal instruction and written materials to support subject.  Instructor reviews cardiac drug classes: antiplatelets, anticoagulants, beta blockers, and statins.  Instructor discusses reasons, side effects, and lifestyle considerations for each drug class.   Cardiac Drugs II:  -Group instruction provided by verbal instruction and written materials to support subject.  Instructor reviews cardiac drug classes: angiotensin converting enzyme inhibitors (ACE-I), angiotensin II receptor blockers (ARBs), nitrates, and calcium channel blockers.  Instructor discusses reasons, side effects, and lifestyle considerations for each drug class.   Anatomy and Physiology of the Circulatory System:  Group verbal and written instruction and models provide basic cardiac anatomy and physiology, with the coronary electrical and arterial systems. Review of: AMI, Angina, Valve disease, Heart Failure, Peripheral Artery Disease, Cardiac Arrhythmia, Pacemakers, and the ICD.   Other Education:  -Group or individual verbal, written, or video instructions that support the educational goals of the cardiac rehab program.   Knowledge Questionnaire Score: Knowledge Questionnaire Score - 06/03/17 1026      Knowledge Questionnaire  Score   Pre Score  17/24       Core Components/Risk Factors/Patient Goals at Admission: Personal Goals and Risk Factors at Admission - 06/03/17 1150      Core Components/Risk Factors/Patient Goals on Admission    Weight Management  Yes;Weight Loss    Intervention  Weight Management: Develop a combined nutrition and exercise program designed to reach desired caloric intake, while maintaining appropriate intake of nutrient and fiber, sodium and fats, and appropriate energy expenditure required for the weight goal.;Weight Management: Provide education and appropriate resources to help participant work on and attain dietary goals.;Weight Management/Obesity: Establish reasonable short term and long term weight goals.;Obesity: Provide education and appropriate resources to help participant work on and attain dietary goals.    Admit Weight  152 lb 1.9 oz (69 kg)    Goal Weight: Short Term  147 lb (66.7 kg)    Goal Weight: Long Term  142 lb (64.4 kg)    Expected Outcomes  Short Term: Continue to assess and modify interventions until short term weight is achieved;Long Term: Adherence to nutrition and physical activity/exercise program aimed toward attainment of established weight goal;Weight Loss: Understanding of general recommendations for a balanced deficit meal plan, which promotes 1-2 lb weight loss per week and includes a negative energy balance of [phone] kcal/d;Understanding recommendations for meals to include 15-35% energy as protein, 25-35% energy from fat, 35-60% energy from carbohydrates, less than 200mg  of dietary cholesterol, 20-35 gm of total fiber daily;Understanding of distribution of calorie intake throughout the day with the consumption of 4-5 meals/snacks;Weight Maintenance: Understanding of the daily nutrition guidelines, which includes 25-35% calories from fat, 7% or less cal from saturated fats, less than 200mg  cholesterol, less than 1.5gm of sodium, & 5 or more servings of fruits and  vegetables daily    Diabetes  Yes    Intervention  Provide education about signs/symptoms and action to take for hypo/hyperglycemia.;Provide education about proper nutrition, including hydration, and aerobic/resistive exercise prescription along with prescribed medications to achieve blood glucose in normal ranges:  Fasting glucose 65-99 mg/dL    Expected Outcomes  Short Term: Participant verbalizes understanding of the signs/symptoms and immediate care of hyper/hypoglycemia, proper foot care and importance of medication, aerobic/resistive exercise and nutrition plan for blood glucose control.;Long Term: Attainment of HbA1C < 7%.    Lipids  Yes    Intervention  Provide education and support for participant on nutrition & aerobic/resistive exercise along with prescribed medications to achieve LDL 70mg , HDL >40mg .    Expected Outcomes  Short Term: Participant states understanding of desired cholesterol values and is compliant with medications prescribed. Participant is following exercise prescription and nutrition guidelines.;Long Term: Cholesterol controlled with medications as prescribed, with individualized exercise RX and with personalized nutrition plan. Value goals: LDL < 70mg , HDL > 40 mg.       Core Components/Risk Factors/Patient Goals Review:  Goals and Risk Factor Review    Row Name 06/09/17 0942 06/23/17 1646           Core Components/Risk Factors/Patient Goals Review   Personal Goals Review  Weight Management/Obesity;Lipids;Diabetes;Hypertension  Weight Management/Obesity;Lipids;Diabetes;Hypertension      Review  pt demonstrates eagerness to participate in group exercise sessions to learn lifestyle modifications.    pt demonstrates eagerness to participate in group exercise sessions to learn lifestyle modifications.        Expected Outcomes  pt will participate in CR exercise, nutrition and lifestyle modifications to decrease overall RF.    pt will participate in CR exercise, nutrition  and lifestyle modifications to decrease overall RF.           Core Components/Risk Factors/Patient Goals at Discharge (Final Review):  Goals and Risk Factor Review - 06/23/17 1646      Core Components/Risk Factors/Patient Goals Review   Personal Goals Review  Weight Management/Obesity;Lipids;Diabetes;Hypertension    Review  pt demonstrates eagerness to participate in group exercise sessions to learn lifestyle modifications.      Expected Outcomes  pt will participate in CR exercise, nutrition and lifestyle modifications to decrease overall RF.         ITP Comments: ITP Comments    Row Name 06/03/17 [id number] 06/09/17 0941 06/23/17 1136       ITP Comments  Dr. Fransico Him, Medical Director  pt started group exercise class today.  pt tolerated light activity without difficulty.  30 day ITP review.  pt with good attendance and participation.         Comments:

## 2017-06-25 ENCOUNTER — Encounter (HOSPITAL_COMMUNITY)
Admission: RE | Admit: 2017-06-25 | Discharge: 2017-06-25 | Disposition: A | Discharge status: Home / Self Care | Payer: Medicare HMO | Source: Ambulatory Visit | Attending: Cardiology | Admitting: Cardiology

## 2017-06-25 DIAGNOSIS — Z951 Presence of aortocoronary bypass graft: Secondary | ICD-10-CM

## 2017-06-25 DIAGNOSIS — Z48812 Encounter for surgical aftercare following surgery on the circulatory system: Secondary | ICD-10-CM | POA: Diagnosis not present

## 2017-06-25 NOTE — Progress Notes (Signed)
Reviewed home exercise with pt today.  Pt plans to walk for exercise,2-3x/week in addition to coming to cardiac rehab.  Reviewed THR, pulse, RPE, sign and symptoms, and when to call 911 or MD.  Also discussed weather considerations and indoor options.  Pt voiced understanding.   Christine [name] [employer]

## 2017-06-25 NOTE — Progress Notes (Signed)
Christine [name] 67 y.o. female DOB: Nov 04, 1950 MRN: [id number]      Nutrition Note  1. 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 Metformin noted  Nutrition Note Spoke with pt. Pt has not increased Metformin dose due to fear of nausea and diarrhea. Pt states her CBG's are not coming down despite "really cutting out the sugar and bread this week and exercising." Importance of blood glucose control with heart disease discussed. Pt expressed understanding of the information reviewed. This Probation officer called Dr. Pennie Banter office and spoke with a CMA re: Metformin intolerance.   Nutrition Diagnosis ? Food-and nutrition-related knowledge deficit related to lack of exposure to information as related to diagnosis of: ? CVD ? DM ? Overweight related to excessive energy intake as evidenced by a BMI of 26.5  Nutrition Intervention ? Will re-fax Dr. Shelia Media, per CMA request, re: ? Change to Metformin XR ? Pt's individual nutrition plan reviewed with pt.  Nutrition Goal(s):  ? Pt to identify food quantities necessary to achieve weight loss of 6-24 lb (2.7-10.9 kg) at graduation from cardiac rehab. ? Pt to have a better understanding of diabetes including how to manage diet and blood glucose levels. ? Improved blood glucose control as evidenced by pt's A1c trending from 9.0 toward less than 7.0.  Plan:  Pt to attend nutrition classes ? Portion Distortion ? Diabetes Q & A - met 06/18/17 Will provide client-centered nutrition education as part of interdisciplinary care.   Monitor and evaluate progress toward nutrition goal with team.  Derek Mound, M.Ed, RD, LDN, CDE 06/25/2017 9:09 AM

## 2017-06-28 ENCOUNTER — Encounter (HOSPITAL_COMMUNITY)
Admission: RE | Admit: 2017-06-28 | Discharge: 2017-06-28 | Disposition: A | Discharge status: Home / Self Care | Payer: Medicare HMO | Source: Ambulatory Visit | Attending: Cardiology | Admitting: Cardiology

## 2017-06-28 DIAGNOSIS — Z951 Presence of aortocoronary bypass graft: Secondary | ICD-10-CM

## 2017-06-28 DIAGNOSIS — Z48812 Encounter for surgical aftercare following surgery on the circulatory system: Secondary | ICD-10-CM | POA: Diagnosis not present

## 2017-06-28 LAB — GLUCOSE, CAPILLARY: GLUCOSE-CAPILLARY: 126 mg/dL — AB (ref 65–99)

## 2017-06-30 ENCOUNTER — Encounter (HOSPITAL_COMMUNITY): Payer: Medicare HMO

## 2017-07-01 ENCOUNTER — Institutional Professional Consult (permissible substitution): Payer: Self-pay | Admitting: Neurology

## 2017-07-02 ENCOUNTER — Encounter (HOSPITAL_COMMUNITY)
Admission: RE | Admit: 2017-07-02 | Discharge: 2017-07-02 | Disposition: A | Discharge status: Home / Self Care | Payer: Medicare HMO | Source: Ambulatory Visit | Attending: Cardiology | Admitting: Cardiology

## 2017-07-02 DIAGNOSIS — Z951 Presence of aortocoronary bypass graft: Secondary | ICD-10-CM | POA: Diagnosis not present

## 2017-07-02 DIAGNOSIS — Z48812 Encounter for surgical aftercare following surgery on the circulatory system: Secondary | ICD-10-CM | POA: Insufficient documentation

## 2017-07-02 NOTE — Progress Notes (Signed)
ADALEIGH WARF 67 y.o. female DOB: March 05, 1951 MRN: [id number]      Nutrition Note  1. 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 Metformin noted  Nutrition Note Spoke with pt. Pt received a call yesterday that her Metformin has been switched to Metformin XR. Pt plans on picking up her Rx today and starting her new medication this weekend. Pt expressed understanding of the information reviewed.   Nutrition Diagnosis ? Food-and nutrition-related knowledge deficit related to lack of exposure to information as related to diagnosis of: ? CVD ? DM ? Overweight related to excessive energy intake as evidenced by a BMI of 26.5  Nutrition Intervention Will monitor pt tolerance of new medication with team.   Nutrition Goal(s):  ? Pt to identify food quantities necessary to achieve weight loss of 6-24 lb (2.7-10.9 kg) at graduation from cardiac rehab. ? Pt to have a better understanding of diabetes including how to manage diet and blood glucose levels. ? Improved blood glucose control as evidenced by pt's A1c trending from 9.0 toward less than 7.0.  Plan:  Pt to attend nutrition classes ? Portion Distortion ? Diabetes Q & A - met 06/18/17 Will provide client-centered nutrition education as part of interdisciplinary care.   Monitor and evaluate progress toward nutrition goal with team.  Derek Mound, M.Ed, RD, LDN, CDE 07/02/2017 12:10 PM

## 2017-07-05 ENCOUNTER — Encounter (HOSPITAL_COMMUNITY)
Admission: RE | Admit: 2017-07-05 | Discharge: 2017-07-05 | Disposition: A | Discharge status: Home / Self Care | Payer: Medicare HMO | Source: Ambulatory Visit | Attending: Cardiology | Admitting: Cardiology

## 2017-07-05 DIAGNOSIS — Z951 Presence of aortocoronary bypass graft: Secondary | ICD-10-CM

## 2017-07-05 DIAGNOSIS — Z48812 Encounter for surgical aftercare following surgery on the circulatory system: Secondary | ICD-10-CM | POA: Diagnosis not present

## 2017-07-05 NOTE — Progress Notes (Signed)
Christine [name] 67 y.o. female DOB: [birth year] MRN: [id number]      Nutrition Note  Dx: s/p CABG x 5 Note Spoke with pt. Nutrition Plan and Nutrition Survey goals reviewed with pt. Pt is following a Heart Healthy diet. Pt wants to lose wt. Pt has been trying to lose wt by following the [employer]. Wt loss tips reviewed. Pt is diabetic.This Probation officer went over Diabetes Education test results. Pt checks CBG's once daily. Fasting CBG's reportedly 180 mg/dL this morning "because I'm stressed." Pt's friend is going into Hospice today after having a CVA. Pt did not start her new RX, Metformin XR, until yesterday. Pt expressed understanding of the information reviewed. Pt aware of nutrition education classes offered. Pt previously given nutrition class handouts. Pt denies questions re: class handouts at this time. .  Nutrition Diagnosis ? Food-and nutrition-related knowledge deficit related to lack of exposure to information as related to diagnosis of: ? CVD ? DM ? Overweight related to excessive energy intake as evidenced by a BMI of 26.5  Nutrition Intervention ? Pt's individual nutrition plan reviewed with pt. ? Benefits of adopting Heart Healthy diet discussed when [employer] reviewed.    Nutrition Goal(s):  ? Pt to identify food quantities necessary to achieve weight loss of 6-24 lb (2.7-10.9 kg) at graduation from cardiac rehab. ? Pt to have a better understanding of diabetes including how to manage diet and blood glucose levels. ? Improved blood glucose control as evidenced by pt's A1c trending from 9.0 toward less than 7.0.  Plan:  Pt to attend nutrition classes ? Portion Distortion ? Diabetes Q & A - met 06/18/17 Will provide client-centered nutrition education as part of interdisciplinary care.   Monitor and evaluate progress toward nutrition goal with team.  Derek Mound, M.Ed, RD, LDN, CDE 07/05/2017 8:56 AM

## 2017-07-07 ENCOUNTER — Encounter (HOSPITAL_COMMUNITY)
Admission: RE | Admit: 2017-07-07 | Discharge: 2017-07-07 | Disposition: A | Discharge status: Home / Self Care | Payer: Medicare HMO | Source: Ambulatory Visit | Attending: Cardiology | Admitting: Cardiology

## 2017-07-07 DIAGNOSIS — Z951 Presence of aortocoronary bypass graft: Secondary | ICD-10-CM

## 2017-07-07 DIAGNOSIS — Z48812 Encounter for surgical aftercare following surgery on the circulatory system: Secondary | ICD-10-CM | POA: Diagnosis not present

## 2017-07-08 ENCOUNTER — Encounter: Payer: Self-pay | Admitting: Neurology

## 2017-07-09 ENCOUNTER — Encounter (HOSPITAL_COMMUNITY): Payer: Medicare HMO

## 2017-07-12 ENCOUNTER — Encounter (HOSPITAL_COMMUNITY): Payer: Medicare HMO

## 2017-07-12 ENCOUNTER — Institutional Professional Consult (permissible substitution): Payer: Self-pay | Admitting: Neurology

## 2017-07-14 ENCOUNTER — Encounter (HOSPITAL_COMMUNITY)
Admission: RE | Admit: 2017-07-14 | Discharge: 2017-07-14 | Disposition: A | Discharge status: Home / Self Care | Payer: Medicare HMO | Source: Ambulatory Visit | Attending: Cardiology | Admitting: Cardiology

## 2017-07-14 DIAGNOSIS — Z951 Presence of aortocoronary bypass graft: Secondary | ICD-10-CM

## 2017-07-14 DIAGNOSIS — Z48812 Encounter for surgical aftercare following surgery on the circulatory system: Secondary | ICD-10-CM | POA: Diagnosis not present

## 2017-07-14 LAB — GLUCOSE, CAPILLARY: Glucose-Capillary: 172 mg/dL — ABNORMAL HIGH (ref 65–99)

## 2017-07-16 ENCOUNTER — Telehealth (HOSPITAL_COMMUNITY): Payer: Self-pay

## 2017-07-16 ENCOUNTER — Encounter (HOSPITAL_COMMUNITY)
Admission: RE | Admit: 2017-07-16 | Discharge: 2017-07-16 | Disposition: A | Discharge status: Home / Self Care | Payer: Medicare HMO | Source: Ambulatory Visit | Attending: Cardiology | Admitting: Cardiology

## 2017-07-16 DIAGNOSIS — Z951 Presence of aortocoronary bypass graft: Secondary | ICD-10-CM

## 2017-07-16 DIAGNOSIS — Z48812 Encounter for surgical aftercare following surgery on the circulatory system: Secondary | ICD-10-CM | POA: Diagnosis not present

## 2017-07-16 NOTE — Telephone Encounter (Signed)
-----   Message from [facility], MD sent at 07/16/2017  6:19 AM EST ----- Regarding: RE: request to do strength training I think that is very reasonable.  Thanks MJP  ----- Message ----- From: Dorna Bloom D Sent: 07/15/2017  12:42 PM To: Manish Esther Hardy, MD Subject: request to do strength training                Patient has been in cardiac rehab approximately 1 month and doing is well. Pt has been averaging 3 METS and BP/HR has been in stabled with exercise. Pt is interested in doing strength training. If you feel this patient is appropriate to begin strength training please f/u with cardiac rehab staff.   Thanks,   Chief Operating Officer

## 2017-07-19 ENCOUNTER — Encounter (HOSPITAL_COMMUNITY)
Admission: RE | Admit: 2017-07-19 | Discharge: 2017-07-19 | Disposition: A | Discharge status: Home / Self Care | Payer: Medicare HMO | Source: Ambulatory Visit | Attending: Cardiology | Admitting: Cardiology

## 2017-07-19 DIAGNOSIS — Z48812 Encounter for surgical aftercare following surgery on the circulatory system: Secondary | ICD-10-CM | POA: Diagnosis not present

## 2017-07-19 DIAGNOSIS — Z951 Presence of aortocoronary bypass graft: Secondary | ICD-10-CM

## 2017-07-21 ENCOUNTER — Encounter (HOSPITAL_COMMUNITY)
Admission: RE | Admit: 2017-07-21 | Discharge: 2017-07-21 | Disposition: A | Discharge status: Home / Self Care | Payer: Medicare HMO | Source: Ambulatory Visit | Attending: Cardiology | Admitting: Cardiology

## 2017-07-21 DIAGNOSIS — Z951 Presence of aortocoronary bypass graft: Secondary | ICD-10-CM

## 2017-07-21 DIAGNOSIS — R69 Illness, unspecified: Secondary | ICD-10-CM | POA: Diagnosis not present

## 2017-07-21 DIAGNOSIS — Z48812 Encounter for surgical aftercare following surgery on the circulatory system: Secondary | ICD-10-CM | POA: Diagnosis not present

## 2017-07-22 ENCOUNTER — Encounter (HOSPITAL_COMMUNITY): Payer: Self-pay

## 2017-07-22 DIAGNOSIS — Z0189 Encounter for other specified special examinations: Secondary | ICD-10-CM | POA: Diagnosis not present

## 2017-07-22 DIAGNOSIS — Z951 Presence of aortocoronary bypass graft: Secondary | ICD-10-CM | POA: Diagnosis not present

## 2017-07-22 DIAGNOSIS — R7303 Prediabetes: Secondary | ICD-10-CM | POA: Diagnosis not present

## 2017-07-22 DIAGNOSIS — I2510 Atherosclerotic heart disease of native coronary artery without angina pectoris: Secondary | ICD-10-CM | POA: Diagnosis not present

## 2017-07-22 NOTE — Progress Notes (Signed)
Cardiac Individual Treatment Plan  Patient Details  Name: Christine [name] MRN: [id number] Date of Birth: 25-Apr-1951 Referring Provider:     CARDIAC REHAB PHASE II ORIENTATION from 06/03/2017 in [place]  Referring Provider  Nigel Mormon MD      Initial Encounter Date:    CARDIAC REHAB PHASE II ORIENTATION from 06/03/2017 in [place]  Date  06/03/17  Referring Provider  Nigel Mormon MD      Visit Diagnosis: S/P CABG x 5 04/09/2017  Patient's Home Medications on Admission:  Current Outpatient Medications:  .  aspirin EC 325 MG tablet, Take 81 mg by mouth daily., Disp: , Rfl:  .  atorvastatin (LIPITOR) 20 MG tablet, Take 20 mg by mouth daily., Disp: , Rfl:  .  metFORMIN (GLUCOPHAGE) 1000 MG tablet, Take 1 tablet (1,000 mg total) 2 (two) times daily with a meal by mouth., Disp: 60 tablet, Rfl: 1 .  metoprolol succinate (TOPROL-XL) 25 MG 24 hr tablet, Take 1 tablet (25 mg total) by mouth 2 (two) times daily., Disp: 60 tablet, Rfl: 3 .  Multiple Vitamins-Minerals (MULTIVITAMIN WITH MINERALS) tablet, Take 1 tablet by mouth daily., Disp: , Rfl:  .  pantoprazole (PROTONIX) 40 MG tablet, Take 40 mg daily as needed by mouth for heartburn. , Disp: , Rfl:   Past Medical History: Past Medical History:  Diagnosis Date  . Angina pectoris ([place])   . Arthritis   . CAD, multiple vessel   . Cancer (H[facility])    18 MONTHS  BASAL CELL   . Chest pain ON EXERTION   . Dyspnea    W/ EXERTION   . GERD (gastroesophageal reflux disease)   . H/O echocardiogram 1990   NORMAL  . Hyperlipidemia   . PAC (premature atrial contraction)   . Pre-diabetes     Tobacco Use: Social History   Tobacco Use  Smoking Status Never Smoker  Smokeless Tobacco Never Used    Labs: Recent Review Flowsheet Data    Labs for ITP Cardiac and Pulmonary Rehab Latest Ref Rng & Units 04/09/2017 04/09/2017 04/09/2017 04/09/2017 04/09/2017   Hemoglobin  A1c 4.8 - 5.6 % - - - - -   PHART 7.350 - 7.450 7.279(L) 7.328(L) 7.309(L) - 7.347(L)   PCO2ART 32.0 - 48.0 mmHg 43.8 48.8(H) 53.6(H) - 46.4   HCO3 20.0 - 28.0 mmol/L 20.8 25.6 26.8 - 25.2   TCO2 22 - 32 mmol/L 22 27 28 26 27    ACIDBASEDEF 0.0 - 2.0 mmol/L 6.0(H) - - - -   O2SAT % 99.0 98.0 96.0 - 96.0      Capillary Blood Glucose: Lab Results  Component Value Date   GLUCAP 172 (H) 07/14/2017   GLUCAP 126 (H) 06/28/2017   GLUCAP 138 (H) 06/16/2017   GLUCAP 155 (H) 06/16/2017   GLUCAP 154 (H) 06/09/2017     Exercise Target Goals:    Exercise Program Goal: Individual exercise prescription set using results from initial 6 min walk test and THRR while considering  patient's activity barriers and safety.   Exercise Prescription Goal: Initial exercise prescription builds to 30-45 minutes a day of aerobic activity, 2-3 days per week.  Home exercise guidelines will be given to patient during program as part of exercise prescription that the participant will acknowledge.  Activity Barriers & Risk Stratification: Activity Barriers & Cardiac Risk Stratification - 06/03/17 0825      Activity Barriers & Cardiac Risk Stratification   Activity  Barriers  Deconditioning;Muscular Weakness;Shortness of Breath;Other (comment)    Comments  B leg fatiguea and aches (since starting statin drug)    Cardiac Risk Stratification  High       6 Minute Walk: 6 Minute Walk    Row Name 06/03/17 1023         6 Minute Walk   Phase  Initial     Distance  1651 feet     Walk Time  6 minutes     # of Rest Breaks  0     MPH  3.13     METS  4.16     RPE  13     Perceived Dyspnea   2     VO2 Peak  14.55     Symptoms  Yes (comment)     Comments  SOB +2      Resting HR  68 bpm     Resting BP  106/64     Resting Oxygen Saturation   98 %     Exercise Oxygen Saturation  during 6 min walk  99 %     Max Ex. HR  90 bpm     Max Ex. BP  126/70     2 Minute Post BP  122/78        Oxygen Initial  Assessment:   Oxygen Re-Evaluation:   Oxygen Discharge (Final Oxygen Re-Evaluation):   Initial Exercise Prescription: Initial Exercise Prescription - 06/03/17 1000      Date of Initial Exercise RX and Referring Provider   Date  06/03/17    Referring Provider  Nigel Mormon MD      Recumbant Bike   Level  2    RPM  70    Watts  30    Minutes  10    METs  3.35      NuStep   Level  2    SPM  70    Minutes  10    METs  3      Track   Laps  12    Minutes  10    METs  3.07      Prescription Details   Frequency (times per week)  3x    Duration  Progress to 30 minutes of continuous aerobic without signs/symptoms of physical distress      Intensity   THRR 40-80% of Max Heartrate  62-114    Ratings of Perceived Exertion  11-15    Perceived Dyspnea  0-4      Progression   Progression  Continue progressive overload as per policy without signs/symptoms or physical distress.      Resistance Training   Training Prescription  Yes    Weight  2lbs    Reps  10-15       Perform Capillary Blood Glucose checks as needed.  Exercise Prescription Changes: Exercise Prescription Changes    Row Name 06/23/17 1100 07/05/17 1554 07/19/17 1600         Response to Exercise   Blood Pressure (Admit)  100/60  102/76  100/60     Blood Pressure (Exercise)  110/60  122/62  104/64     Blood Pressure (Exit)  100/60  102/64  110/60     Heart Rate (Admit)  65 bpm  67 bpm  70 bpm     Heart Rate (Exercise)  84 bpm  96 bpm  98 bpm     Heart Rate (Exit)  66 bpm  62 bpm  70 bpm     Rating of Perceived Exertion (Exercise)  13  11  13      Duration  Continue with 30 min of aerobic exercise without signs/symptoms of physical distress.  Continue with 30 min of aerobic exercise without signs/symptoms of physical distress.  Continue with 30 min of aerobic exercise without signs/symptoms of physical distress.     Intensity  THRR unchanged  THRR unchanged  THRR unchanged       Progression    Progression  Continue to progress workloads to maintain intensity without signs/symptoms of physical distress.  Continue to progress workloads to maintain intensity without signs/symptoms of physical distress.  Continue to progress workloads to maintain intensity without signs/symptoms of physical distress.     Average METs  2.6  3  3.2       Resistance Training   Training Prescription  Yes  Yes  Yes     Weight  2lb   2lb   3lbs     Reps  10-15  10-15  10-15     Time  10 Minutes  10 Minutes  10 Minutes       Recumbant Bike   Level  3  3  3.5     RPM  70  70  70     Watts  30  30  30      Minutes  10  10  10      METs  2.5  2.7  2.8       NuStep   Level  3  4  4      SPM  60  75  75     Minutes  10  10  10      METs  2.4  3.1  3.3       Track   Laps  11  13  15      Minutes  10  10  10      METs  2.92  3.26  3.6       Home Exercise Plan   Plans to continue exercise at  -  Home (comment)  Home (comment) walking     Frequency  -  Add 2 additional days to program exercise sessions.  Add 2 additional days to program exercise sessions.     Initial Home Exercises Provided  -  06/25/17  06/25/17        Exercise Comments: Exercise Comments    Row Name 06/23/17 1646 07/15/17 1241         Exercise Comments  Reviewed METs and goals. Pt has tolerated exercise prescription very well; will continue to monitor activity levels and progress as tolerated.  Reviewed METs and goals. Pt has tolerated exercise prescription very well; will continue to monitor activity levels and progress as tolerated.         Exercise Goals and Review: Exercise Goals    Row Name 06/03/17 0826             Exercise Goals   Increase Physical Activity  Yes       Intervention  Provide advice, education, support and counseling about physical activity/exercise needs.;Develop an individualized exercise prescription for aerobic and resistive training based on initial evaluation findings, risk stratification,  comorbidities and participant's personal goals.       Expected Outcomes  Achievement of increased cardiorespiratory fitness and enhanced flexibility, muscular endurance and strength shown through measurements of functional capacity and personal statement of participant.       Increase  Strength and Stamina  Yes increase walking tolerance       Intervention  Provide advice, education, support and counseling about physical activity/exercise needs.;Develop an individualized exercise prescription for aerobic and resistive training based on initial evaluation findings, risk stratification, comorbidities and participant's personal goals.       Expected Outcomes  Achievement of increased cardiorespiratory fitness and enhanced flexibility, muscular endurance and strength shown through measurements of functional capacity and personal statement of participant.       Able to understand and use rate of perceived exertion (RPE) scale  Yes       Intervention  Provide education and explanation on how to use RPE scale       Expected Outcomes  Short Term: Able to use RPE daily in rehab to express subjective intensity level;Long Term:  Able to use RPE to guide intensity level when exercising independently       Knowledge and understanding of Target Heart Rate Range (THRR)  Yes       Intervention  Provide education and explanation of THRR including how the numbers were predicted and where they are located for reference       Expected Outcomes  Short Term: Able to state/look up THRR;Long Term: Able to use THRR to govern intensity when exercising independently;Short Term: Able to use daily as guideline for intensity in rehab       Able to check pulse independently  Yes       Intervention  Provide education and demonstration on how to check pulse in carotid and radial arteries.;Review the importance of being able to check your own pulse for safety during independent exercise       Expected Outcomes  Short Term: Able to  explain why pulse checking is important during independent exercise;Long Term: Able to check pulse independently and accurately       Understanding of Exercise Prescription  Yes       Intervention  Provide education, explanation, and written materials on patient's individual exercise prescription       Expected Outcomes  Short Term: Able to explain program exercise prescription;Long Term: Able to explain home exercise prescription to exercise independently          Exercise Goals Re-Evaluation : Exercise Goals Re-Evaluation    Row Name 06/23/17 1647 06/25/17 1201 07/15/17 1241         Exercise Goal Re-Evaluation   Exercise Goals Review  Increase Physical Activity;Understanding of Exercise Prescription;Increase Strength and Stamina;Knowledge and understanding of Target Heart Rate Range (THRR);Able to understand and use rate of perceived exertion (RPE) scale;Able to check pulse independently  -  Increase Physical Activity;Understanding of Exercise Prescription;Increase Strength and Stamina;Knowledge and understanding of Target Heart Rate Range (THRR);Able to understand and use rate of perceived exertion (RPE) scale;Able to check pulse independently     Comments  Pt has increased walking tolerance and overall capacity. Pt is very motivated to exercise and works very hard in cardiac rehab.   Reviewed home exercise with pt today.  Pt plans to walk for exercise,2-3x/week in addition to coming to cardiac rehab.  Reviewed THR, pulse, RPE, sign and symptoms, and when to call 911 or MD.  Also discussed weather considerations and indoor options.  Pt voiced understanding.  Pt is making great progress in cardiac rehab. Pt stated interest in weight lifting to increase muscle mass/tone. Sent request to Cardiologist, awaiting response.     Expected Outcomes  Pt will continue to improve in cardiorespiratory fitness, muscular endurance  and overall strength  Pt will be compliant with home exercise and continue to  improve in aerobic fitness  Pt will be compliant with home exercise and continue to improve in aerobic fitness         Discharge Exercise Prescription (Final Exercise Prescription Changes): Exercise Prescription Changes - 07/19/17 1600      Response to Exercise   Blood Pressure (Admit)  100/60    Blood Pressure (Exercise)  104/64    Blood Pressure (Exit)  110/60    Heart Rate (Admit)  70 bpm    Heart Rate (Exercise)  98 bpm    Heart Rate (Exit)  70 bpm    Rating of Perceived Exertion (Exercise)  13    Duration  Continue with 30 min of aerobic exercise without signs/symptoms of physical distress.    Intensity  THRR unchanged      Progression   Progression  Continue to progress workloads to maintain intensity without signs/symptoms of physical distress.    Average METs  3.2      Resistance Training   Training Prescription  Yes    Weight  3lbs    Reps  10-15    Time  10 Minutes      Recumbant Bike   Level  3.5    RPM  70    Watts  30    Minutes  10    METs  2.8      NuStep   Level  4    SPM  75    Minutes  10    METs  3.3      Track   Laps  15    Minutes  10    METs  3.6      Home Exercise Plan   Plans to continue exercise at  Home (comment) walking    Frequency  Add 2 additional days to program exercise sessions.    Initial Home Exercises Provided  06/25/17       Nutrition:  Target Goals: Understanding of nutrition guidelines, daily intake of sodium 1500mg , cholesterol 200mg , calories 30% from fat and 7% or less from saturated fats, daily to have 5 or more servings of fruits and vegetables.  Biometrics: Pre Biometrics - 06/03/17 1025      Pre Biometrics   Height  5\' 1"  (1.549 m)    Weight  152 lb 1.9 oz (69 kg)    Waist Circumference  37 inches    Hip Circumference  42 inches    Waist to Hip Ratio  0.88 %    BMI (Calculated)  28.76    Triceps Skinfold  25 mm    % Body Fat  40 %    Grip Strength  33 kg    Flexibility  13 in    Single Leg Stand   12.84 seconds        Nutrition Therapy Plan and Nutrition Goals: Nutrition Therapy & Goals - 06/04/17 0849      Nutrition Therapy   Diet  Carb Modified, Heart Healthy      Personal Nutrition Goals   Nutrition Goal  Pt to identify food quantities necessary to achieve weight loss of 6-24 lb (2.7-10.9 kg) at graduation from cardiac rehab.    Personal Goal #2  Pt to have a better understanding of diabetes including how to manage diet and blood glucose levels.    Personal Goal #3  Improved blood glucose control as evidenced by pt's A1c trending from 9.0 toward  less than 7.0.      Intervention Plan   Intervention  Prescribe, educate and counsel regarding individualized specific dietary modifications aiming towards targeted core components such as weight, hypertension, lipid management, diabetes, heart failure and other comorbidities.    Expected Outcomes  Short Term Goal: Understand basic principles of dietary content, such as calories, fat, sodium, cholesterol and nutrients.;Long Term Goal: Adherence to prescribed nutrition plan.       Nutrition Assessments: Nutrition Assessments - 06/04/17 0850      MEDFICTS Scores   Pre Score  32       Nutrition Goals Re-Evaluation:   Nutrition Goals Re-Evaluation:   Nutrition Goals Discharge (Final Nutrition Goals Re-Evaluation):   Psychosocial: Target Goals: Acknowledge presence or absence of significant depression and/or stress, maximize coping skills, provide positive support system. Participant is able to verbalize types and ability to use techniques and skills needed for reducing stress and depression.  Initial Review & Psychosocial Screening: Initial Psych Review & Screening - 06/03/17 1139      Initial Review   Current issues with  None Identified      Family Dynamics   Good Support System?  Yes    Comments  Jakari has good family support. A brief assessment reveals no futher interventions are needed at this time.       Barriers   Psychosocial barriers to participate in program  There are no identifiable barriers or psychosocial needs.      Screening Interventions   Interventions  Encouraged to exercise       Quality of Life Scores: Quality of Life - 06/03/17 1028      Quality of Life Scores   Health/Function Pre  21.43 %    Socioeconomic Pre  28.29 %    Psych/Spiritual Pre  28.29 %    Family Pre  26.4 %    GLOBAL Pre  25.09 %      Scores of 19 and below usually indicate a poorer quality of life in these areas.  A difference of  2-3 points is a clinically meaningful difference.  A difference of 2-3 points in the total score of the Quality of Life Index has been associated with significant improvement in overall quality of life, self-image, physical symptoms, and general health in studies assessing change in quality of life.  PHQ-9: Recent Review Flowsheet Data    Depression screen [facility] 2/9 06/09/2017 04/20/2017   Decreased Interest 0 0   Down, Depressed, Hopeless 0 0   PHQ - 2 Score 0 0     Interpretation of Total Score  Total Score Depression Severity:  1-4 = Minimal depression, 5-9 = Mild depression, 10-14 = Moderate depression, 15-19 = Moderately severe depression, 20-27 = Severe depression   Psychosocial Evaluation and Intervention: Psychosocial Evaluation - 06/09/17 0945      Psychosocial Evaluation & Interventions   Interventions  Encouraged to exercise with the program and follow exercise prescription    Comments  no psychosocial needs identified, no interventions necessary.     Expected Outcomes  pt will exhibit positive outlook with good coping skills.     Continue Psychosocial Services   No Follow up required       Psychosocial Re-Evaluation: Psychosocial Re-Evaluation    [place] Name 06/23/17 1646 07/13/17 1334           Psychosocial Re-Evaluation   Current issues with  None Identified  None Identified      Comments  no psychosocial needs identified, no interventions  necessary   no psychosocial needs identified, no interventions necessary       Expected Outcomes  pt will exhibit positive outlook with good coping skills.   pt will exhibit positive outlook with good coping skills.       Interventions  Encouraged to attend Cardiac Rehabilitation for the exercise;Stress management education;Relaxation education  Encouraged to attend Cardiac Rehabilitation for the exercise;Stress management education;Relaxation education      Continue Psychosocial Services   No Follow up required  No Follow up required         Psychosocial Discharge (Final Psychosocial Re-Evaluation): Psychosocial Re-Evaluation - 07/13/17 1334      Psychosocial Re-Evaluation   Current issues with  None Identified    Comments  no psychosocial needs identified, no interventions necessary     Expected Outcomes  pt will exhibit positive outlook with good coping skills.     Interventions  Encouraged to attend Cardiac Rehabilitation for the exercise;Stress management education;Relaxation education    Continue Psychosocial Services   No Follow up required       Vocational Rehabilitation: Provide vocational rehab assistance to qualifying candidates.   Vocational Rehab Evaluation & Intervention: Vocational Rehab - 06/03/17 1135      Initial Vocational Rehab Evaluation & Intervention   Assessment shows need for Vocational Rehabilitation  No Kamera is retired and does not need vocational rehab at this time.       Education: Education Goals: Education classes will be provided on a weekly basis, covering required topics. Participant will state understanding/return demonstration of topics presented.  Learning Barriers/Preferences: Learning Barriers/Preferences - 06/03/17 7824      Learning Barriers/Preferences   Learning Barriers  Sight    Learning Preferences  Verbal Instruction;Skilled Demonstration;Written Material;Video;Pictoral       Education Topics: Count Your Pulse:  -Group  instruction provided by verbal instruction, demonstration, patient participation and written materials to support subject.  Instructors address importance of being able to find your pulse and how to count your pulse when at home without a heart monitor.  Patients get hands on experience counting their pulse with staff help and individually.   CARDIAC REHAB PHASE II EXERCISE from 07/21/2017 in [place]  Date  06/25/17  Educator  RN      Heart Attack, Angina, and Risk Factor Modification:  -Group instruction provided by verbal instruction, video, and written materials to support subject.  Instructors address signs and symptoms of angina and heart attacks.    Also discuss risk factors for heart disease and how to make changes to improve heart health risk factors.   Functional Fitness:  -Group instruction provided by verbal instruction, demonstration, patient participation, and written materials to support subject.  Instructors address safety measures for doing things around the house.  Discuss how to get up and down off the floor, how to pick things up properly, how to safely get out of a chair without assistance, and balance training.   Meditation and Mindfulness:  -Group instruction provided by verbal instruction, patient participation, and written materials to support subject.  Instructor addresses importance of mindfulness and meditation practice to help reduce stress and improve awareness.  Instructor also leads participants through a meditation exercise.    Stretching for Flexibility and Mobility:  -Group instruction provided by verbal instruction, patient participation, and written materials to support subject.  Instructors lead participants through series of stretches that are designed to increase flexibility thus improving mobility.  These stretches are additional exercise for  major muscle groups that are typically performed during regular warm up and cool  down.   Hands Only CPR:  -Group verbal, video, and participation provides a basic overview of AHA guidelines for community CPR. Role-play of emergencies allow participants the opportunity to practice calling for help and chest compression technique with discussion of AED use.   Hypertension: -Group verbal and written instruction that provides a basic overview of hypertension including the most recent diagnostic guidelines, risk factor reduction with self-care instructions and medication management.   CARDIAC REHAB PHASE II EXERCISE from 07/21/2017 in [place]  Date  06/11/17       Nutrition I class: Heart Healthy Eating:  -Group instruction provided by PowerPoint slides, verbal discussion, and written materials to support subject matter. The instructor gives an explanation and review of the Therapeutic Lifestyle Changes diet recommendations, which includes a discussion on lipid goals, dietary fat, sodium, fiber, plant stanol/sterol esters, sugar, and the components of a well-balanced, healthy diet.   CARDIAC REHAB PHASE II EXERCISE from 07/21/2017 in [place]  Date  06/03/17  Educator  RD      Nutrition II class: Lifestyle Skills:  -Group instruction provided by PowerPoint slides, verbal discussion, and written materials to support subject matter. The instructor gives an explanation and review of label reading, grocery shopping for heart health, heart healthy recipe modifications, and ways to make healthier choices when eating out.   CARDIAC REHAB PHASE II EXERCISE from 07/21/2017 in [place]  Date  06/03/17  Educator  RD      Diabetes Question & Answer:  -Group instruction provided by PowerPoint slides, verbal discussion, and written materials to support subject matter. The instructor gives an explanation and review of diabetes co-morbidities, pre- and post-prandial blood glucose goals,  pre-exercise blood glucose goals, signs, symptoms, and treatment of hypoglycemia and hyperglycemia, and foot care basics.   CARDIAC REHAB PHASE II EXERCISE from 07/21/2017 in [place]  Date  06/18/17  Educator  RD      Diabetes Blitz:  -Group instruction provided by PowerPoint slides, verbal discussion, and written materials to support subject matter. The instructor gives an explanation and review of the physiology behind type 1 and type 2 diabetes, diabetes medications and rational behind using different medications, pre- and post-prandial blood glucose recommendations and Hemoglobin A1c goals, diabetes diet, and exercise including blood glucose guidelines for exercising safely.    CARDIAC REHAB PHASE II EXERCISE from 07/21/2017 in [place]  Date  06/03/17  Educator  RD      Portion Distortion:  -Group instruction provided by PowerPoint slides, verbal discussion, written materials, and food models to support subject matter. The instructor gives an explanation of serving size versus portion size, changes in portions sizes over the last 20 years, and what consists of a serving from each food group.   Stress Management:  -Group instruction provided by verbal instruction, video, and written materials to support subject matter.  Instructors review role of stress in heart disease and how to cope with stress positively.     CARDIAC REHAB PHASE II EXERCISE from 07/21/2017 in [place]  Date  07/07/17  Instruction Review Code  2- Demonstrated Understanding      Exercising on Your Own:  -Group instruction provided by verbal instruction, power point, and written materials to support subject.  Instructors discuss benefits of exercise,  components of exercise, frequency and intensity of exercise, and end points for exercise.  Also discuss use of nitroglycerin and activating EMS.  Review options of places  to exercise outside of rehab.  Review guidelines for sex with heart disease.   CARDIAC REHAB PHASE II EXERCISE from 07/21/2017 in [place]  Date  06/16/17      Cardiac Drugs I:  -Group instruction provided by verbal instruction and written materials to support subject.  Instructor reviews cardiac drug classes: antiplatelets, anticoagulants, beta blockers, and statins.  Instructor discusses reasons, side effects, and lifestyle considerations for each drug class.   Cardiac Drugs II:  -Group instruction provided by verbal instruction and written materials to support subject.  Instructor reviews cardiac drug classes: angiotensin converting enzyme inhibitors (ACE-I), angiotensin II receptor blockers (ARBs), nitrates, and calcium channel blockers.  Instructor discusses reasons, side effects, and lifestyle considerations for each drug class.   CARDIAC REHAB PHASE II EXERCISE from 07/21/2017 in [place]  Date  07/14/17  Instruction Review Code  2- Demonstrated Understanding      Anatomy and Physiology of the Circulatory System:  Group verbal and written instruction and models provide basic cardiac anatomy and physiology, with the coronary electrical and arterial systems. Review of: AMI, Angina, Valve disease, Heart Failure, Peripheral Artery Disease, Cardiac Arrhythmia, Pacemakers, and the ICD.   CARDIAC REHAB PHASE II EXERCISE from 07/21/2017 in [place]  Date  07/21/17  Instruction Review Code  2- Demonstrated Understanding      Other Education:  -Group or individual verbal, written, or video instructions that support the educational goals of the cardiac rehab program.   Holiday Eating Survival Tips:  -Group instruction provided by PowerPoint slides, verbal discussion, and written materials to support subject matter. The instructor gives patients tips, tricks, and techniques to help them not  only survive but enjoy the holidays despite the onslaught of food that accompanies the holidays.   Knowledge Questionnaire Score: Knowledge Questionnaire Score - 06/03/17 1026      Knowledge Questionnaire Score   Pre Score  17/24       Core Components/Risk Factors/Patient Goals at Admission: Personal Goals and Risk Factors at Admission - 06/03/17 1150      Core Components/Risk Factors/Patient Goals on Admission    Weight Management  Yes;Weight Loss    Intervention  Weight Management: Develop a combined nutrition and exercise program designed to reach desired caloric intake, while maintaining appropriate intake of nutrient and fiber, sodium and fats, and appropriate energy expenditure required for the weight goal.;Weight Management: Provide education and appropriate resources to help participant work on and attain dietary goals.;Weight Management/Obesity: Establish reasonable short term and long term weight goals.;Obesity: Provide education and appropriate resources to help participant work on and attain dietary goals.    Admit Weight  152 lb 1.9 oz (69 kg)    Goal Weight: Short Term  147 lb (66.7 kg)    Goal Weight: Long Term  142 lb (64.4 kg)    Expected Outcomes  Short Term: Continue to assess and modify interventions until short term weight is achieved;Long Term: Adherence to nutrition and physical activity/exercise program aimed toward attainment of established weight goal;Weight Loss: Understanding of general recommendations for a balanced deficit meal plan, which promotes 1-2 lb weight loss per week and includes a negative energy balance of [phone] kcal/d;Understanding recommendations for meals to include 15-35% energy as protein, 25-35% energy from fat, 35-60% energy  from carbohydrates, less than 200mg  of dietary cholesterol, 20-35 gm of total fiber daily;Understanding of distribution of calorie intake throughout the day with the consumption of 4-5 meals/snacks;Weight Maintenance:  Understanding of the daily nutrition guidelines, which includes 25-35% calories from fat, 7% or less cal from saturated fats, less than 200mg  cholesterol, less than 1.5gm of sodium, & 5 or more servings of fruits and vegetables daily    Diabetes  Yes    Intervention  Provide education about signs/symptoms and action to take for hypo/hyperglycemia.;Provide education about proper nutrition, including hydration, and aerobic/resistive exercise prescription along with prescribed medications to achieve blood glucose in normal ranges: Fasting glucose 65-99 mg/dL    Expected Outcomes  Short Term: Participant verbalizes understanding of the signs/symptoms and immediate care of hyper/hypoglycemia, proper foot care and importance of medication, aerobic/resistive exercise and nutrition plan for blood glucose control.;Long Term: Attainment of HbA1C < 7%.    Lipids  Yes    Intervention  Provide education and support for participant on nutrition & aerobic/resistive exercise along with prescribed medications to achieve LDL 70mg , HDL >40mg .    Expected Outcomes  Short Term: Participant states understanding of desired cholesterol values and is compliant with medications prescribed. Participant is following exercise prescription and nutrition guidelines.;Long Term: Cholesterol controlled with medications as prescribed, with individualized exercise RX and with personalized nutrition plan. Value goals: LDL < 70mg , HDL > 40 mg.       Core Components/Risk Factors/Patient Goals Review:  Goals and Risk Factor Review    Row Name 06/09/17 3154 06/23/17 1646 07/13/17 1334         Core Components/Risk Factors/Patient Goals Review   Personal Goals Review  Weight Management/Obesity;Lipids;Diabetes;Hypertension  Weight Management/Obesity;Lipids;Diabetes;Hypertension  Weight Management/Obesity;Lipids;Diabetes;Hypertension     Review  pt demonstrates eagerness to participate in group exercise sessions to learn lifestyle  modifications.    pt demonstrates eagerness to participate in group exercise sessions to learn lifestyle modifications.    pt demonstrates compliance with medication regimen.  pt blood pressure and blood sugar well controlled at cardiac rehab.      Expected Outcomes  pt will participate in CR exercise, nutrition and lifestyle modifications to decrease overall RF.    pt will participate in CR exercise, nutrition and lifestyle modifications to decrease overall RF.    pt will participate in CR exercise, nutrition and lifestyle modifications to decrease overall RF.          Core Components/Risk Factors/Patient Goals at Discharge (Final Review):  Goals and Risk Factor Review - 07/13/17 1334      Core Components/Risk Factors/Patient Goals Review   Personal Goals Review  Weight Management/Obesity;Lipids;Diabetes;Hypertension    Review  pt demonstrates compliance with medication regimen.  pt blood pressure and blood sugar well controlled at cardiac rehab.     Expected Outcomes  pt will participate in CR exercise, nutrition and lifestyle modifications to decrease overall RF.         ITP Comments: ITP Comments    Row Name 06/03/17 0823 06/09/17 0941 06/23/17 1136 07/13/17 1333     ITP Comments  Dr. Fransico Him, Medical Director  pt started group exercise class today.  pt tolerated light activity without difficulty.  30 day ITP review.  pt with good attendance and participation.   30 day ITP review.  pt with good attendance and participation. pt demonstrates eagerness to participate in CR group exercise program.         Comments:

## 2017-07-23 ENCOUNTER — Encounter (HOSPITAL_COMMUNITY)
Admission: RE | Admit: 2017-07-23 | Discharge: 2017-07-23 | Disposition: A | Discharge status: Home / Self Care | Payer: Medicare HMO | Source: Ambulatory Visit | Attending: Cardiology | Admitting: Cardiology

## 2017-07-23 DIAGNOSIS — Z48812 Encounter for surgical aftercare following surgery on the circulatory system: Secondary | ICD-10-CM | POA: Diagnosis not present

## 2017-07-23 DIAGNOSIS — Z951 Presence of aortocoronary bypass graft: Secondary | ICD-10-CM

## 2017-07-23 NOTE — Progress Notes (Signed)
Christine [name] 67 y.o. female DOB: 1950-06-28 MRN: [id number]      Nutrition Note  Dx: s/p CABG x 5 Note Spoke with pt. Pt states she has been on her Metformin XR at the prescribed dose for "about a week." Pt denies s/s of GI distress. Per discussion, pt states "I can't tell that the medication is making a difference." Pt frustrated that she is eating better, exercising, and taking medication and her blood sugar is still "high." Pt encouraged to give the diet, exercise, and medication some time. Pt states she has a f/u appt with her PCP in a month for DM. Pt expressed understanding of the information reviewed.   Nutrition Diagnosis ? Food-and nutrition-related knowledge deficit related to lack of exposure to information as related to diagnosis of: ? CVD ? DM ? Overweight related to excessive energy intake as evidenced by a BMI of 26.5  Nutrition Intervention ? Pt's individual nutrition plan reviewed with pt.  Nutrition Goal(s):  ? Pt to identify food quantities necessary to achieve weight loss of 6-24 lb (2.7-10.9 kg) at graduation from cardiac rehab. ? Pt to have a better understanding of diabetes including how to manage diet and blood glucose levels. ? Improved blood glucose control as evidenced by pt's A1c trending from 9.0 toward less than 7.0.  Plan:  Pt to attend nutrition classes ? Portion Distortion ? Diabetes Q & A - met 06/18/17 Will provide client-centered nutrition education as part of interdisciplinary care.   Monitor and evaluate progress toward nutrition goal with team.  Derek Mound, M.Ed, RD, LDN, CDE 07/23/2017 9:02 AM

## 2017-07-26 ENCOUNTER — Encounter (HOSPITAL_COMMUNITY)
Admission: RE | Admit: 2017-07-26 | Discharge: 2017-07-26 | Disposition: A | Discharge status: Home / Self Care | Payer: Medicare HMO | Source: Ambulatory Visit | Attending: Cardiology | Admitting: Cardiology

## 2017-07-26 VITALS — Wt 150.8 lb

## 2017-07-26 DIAGNOSIS — Z48812 Encounter for surgical aftercare following surgery on the circulatory system: Secondary | ICD-10-CM | POA: Diagnosis not present

## 2017-07-26 DIAGNOSIS — Z951 Presence of aortocoronary bypass graft: Secondary | ICD-10-CM

## 2017-07-28 ENCOUNTER — Encounter (HOSPITAL_COMMUNITY): Payer: Medicare HMO

## 2017-07-28 ENCOUNTER — Telehealth (HOSPITAL_COMMUNITY): Payer: Self-pay | Admitting: *Deleted

## 2017-07-30 ENCOUNTER — Encounter (HOSPITAL_COMMUNITY): Payer: Medicare HMO

## 2017-08-02 ENCOUNTER — Encounter (HOSPITAL_COMMUNITY): Payer: Medicare HMO

## 2017-08-03 ENCOUNTER — Ambulatory Visit: Payer: Medicare HMO | Admitting: Neurology

## 2017-08-03 VITALS — BP 100/64 | HR 60 | Ht 61.0 in | Wt 148.0 lb

## 2017-08-03 DIAGNOSIS — E1169 Type 2 diabetes mellitus with other specified complication: Secondary | ICD-10-CM | POA: Diagnosis not present

## 2017-08-03 DIAGNOSIS — Z951 Presence of aortocoronary bypass graft: Secondary | ICD-10-CM

## 2017-08-03 DIAGNOSIS — F519 Sleep disorder not due to a substance or known physiological condition, unspecified: Secondary | ICD-10-CM

## 2017-08-03 DIAGNOSIS — R0683 Snoring: Secondary | ICD-10-CM | POA: Diagnosis not present

## 2017-08-03 DIAGNOSIS — F5109 Other insomnia not due to a substance or known physiological condition: Secondary | ICD-10-CM | POA: Diagnosis not present

## 2017-08-03 DIAGNOSIS — I201 Angina pectoris with documented spasm: Secondary | ICD-10-CM | POA: Diagnosis not present

## 2017-08-03 DIAGNOSIS — R69 Illness, unspecified: Secondary | ICD-10-CM | POA: Diagnosis not present

## 2017-08-03 DIAGNOSIS — I2510 Atherosclerotic heart disease of native coronary artery without angina pectoris: Secondary | ICD-10-CM

## 2017-08-03 NOTE — Progress Notes (Signed)
SLEEP MEDICINE CLINIC   Provider:  Larey Seat, M D  Primary Care Physician:  Deland Pretty, MD   Referring Provider:  Virgina Jock, MD , Cardiology    Chief Complaint  Patient presents with  . New Patient (Initial Visit)    Room 10. Patient is here alone. Pt endorses daytime sleepiness and snoring.     HPI:  Christine [name] is a 67 y.o. female, seen here in a referral from Cardiologist Dr. Charolette Child to be evaluated for the possible presence of sleep apnea.  The patient carries a diagnosis of coronary artery disease and recently was diagnosed with severe multivessel coronary artery disease for which she underwent a successful 5 vessel CABG on 09 April 2017.  After surgery she had difficulties with extubation and breathing.  After surgery she wore an event monitor which allowed to the tracing of palpitations and frequent PACs is also seen on regular EKGs.  There was aberrant conduction without any atrial fibrillation or flutter.  She is now on an increased dose of metoprolol and her symptoms of palpitations have improved.  She started cardiac rehabilitation in January.  She has no chest pain or shortness of breath at this time.  She did have some perioperative blood loss, fatigue and episodes of sleepiness followed during the daytime.  She also has noted that she snored for many years and several years ago had undergone a sleep study ( 10 years ? At [facility], [place] [facility] ) Dr Minna Antis had ordered the study, she reports she couldn't sleep in that " basement" . Further carries a diagnosis of prediabetes, basal cell carcinoma, GERD, and now resolved angina.   Chief complaint according to patient : " gasping at night, snoring, dry mouth in the morning, some headaches in AM"   Sleep habits are as follows: The patient reports that her bedtime is usually between 11 and midnight, and that for the last hours before she goes to the bedroom she will be in her living room watching TV  reading etc. her bedroom is cool quiet and dark.  She sleeps on a flat mattress nonadjustable bed.  She prefers to fall asleep on her side, she sleeps on one pillow.  She is promptly asleep, within 5 minutes after entering bed. Her husband wakes up many times at night, has nocturia- and this often wakes her. She usually sleeps through the night without bathroom break, and only sometimes wakes up gasping at night or waking herself from snoring.  She usually finds herself on her back when this happens.  She rises in the morning at  6 AM and is usually spontaneously awake before her alarm rings. She feels usually rested and restored in the morning but during the day will take short power naps of 10 or 15 minutes.  She feels that those are most refreshing by longer naps keep her feeling more tired.  She also mentions that when not physically active or mentally stimulated it is very easy for her to go to sleep outside the bedroom.  Sleep medical history and family sleep history:  No family history of apnea, no sleep walking history, no night terrors, but her younger  brother was a sleep walker.  He died at 22 of cancer. Father had vascular dementia.   Social history: married, with 2 children : adaughter is 41, son 57 . Both live independent .  Retired as a Biochemist, clinical, travelled a lot. No tobacco use in any from ever, no passive smoke  exposure. ETOH ; less than 5 a week. Caffeine- 1  glass of iced tea, hot tea in AM.  Rarely sodas.   Review of Systems: Out of a complete 14 system review, the patient complains of only the following symptoms, and all other reviewed systems are negative. Snoring, dry moth. She feels usually rested and restored in the morning but during the day will take short power naps of 10 or 15 minutes.  She feels that those are most refreshing by longer naps keep her feeling more tired.  She also mentions that when not physically active or mentally stimulated it is very easy for her to go to  sleep outside the bedroom.  Epworth score 12 , Fatigue severity score 36 , depression score- n/a    Social History   Socioeconomic History  . Marital status: Married    Spouse name: Eddie Dibbles  . Number of children: Not on file  . Years of education: Not on file  . Highest education level: Not on file  Social Needs  . Financial resource strain: Not on file  . Food insecurity - worry: Not on file  . Food insecurity - inability: Not on file  . Transportation needs - medical: Not on file  . Transportation needs - non-medical: Not on file  Occupational History  . Not on file  Tobacco Use  . Smoking status: Never Smoker  . Smokeless tobacco: Never Used  Substance and Sexual Activity  . Alcohol use: No    Frequency: Never  . Drug use: No  . Sexual activity: Not on file  Other Topics Concern  . Not on file  Social History Narrative  . Not on file    Family History  Problem Relation Age of Onset  . Cancer Mother        LUNG  . Cancer Father        COLON, MELANOMA  . Heart disease Father 59       MI   . Stroke Father   . Heart disease Brother 79       MI    Past Medical History:  Diagnosis Date  . Angina pectoris ([place])   . Arthritis   . CAD, multiple vessel   . Cancer (HCC)    18 MONTHS  BASAL CELL   . Chest pain ON EXERTION   . Dyspnea    W/ EXERTION   . GERD (gastroesophageal reflux disease)   . H/O echocardiogram 1990   NORMAL  . Hyperlipidemia   . PAC (premature atrial contraction)   . Pre-diabetes     Past Surgical History:  Procedure Laterality Date  . BASAL CELL CARCINOMA EXCISION     19 MO OLD  . CARDIAC CATHETERIZATION    . CORONARY ARTERY BYPASS GRAFT N/A 04/09/2017   Procedure: CORONARY ARTERY BYPASS GRAFTING (CABG)x5 USING RIGHT SAPHENOUS VEIN, HARVESTED ENDOSCOPICALLY AND LEFT INTERNAL MAMMARY ARTERY;  Surgeon: Melrose Nakayama, MD;  Location: [place];  Service: Open Heart Surgery;  Laterality: N/A;  . LEFT HEART CATH AND CORONARY ANGIOGRAPHY  N/A 04/01/2017   Procedure: LEFT HEART CATH AND CORONARY ANGIOGRAPHY;  Surgeon: Nigel Mormon, MD;  Location: [place] [facility];  Service: Cardiovascular;  Laterality: N/A;  . TEE WITHOUT CARDIOVERSION N/A 04/09/2017   Procedure: TRANSESOPHAGEAL ECHOCARDIOGRAM (TEE);  Surgeon: Melrose Nakayama, MD;  Location: [place];  Service: Open Heart Surgery;  Laterality: N/A;    Current Outpatient Medications  Medication Sig Dispense Refill  . aspirin EC 325  MG tablet Take 81 mg by mouth daily.    Marland Kitchen atorvastatin (LIPITOR) 20 MG tablet Take 20 mg by mouth daily.    . metFORMIN (GLUCOPHAGE) 1000 MG tablet Take 1 tablet (1,000 mg total) 2 (two) times daily with a meal by mouth. 60 tablet 1  . metoprolol succinate (TOPROL-XL) 25 MG 24 hr tablet Take 1 tablet (25 mg total) by mouth 2 (two) times daily. 60 tablet 3  . Multiple Vitamins-Minerals (MULTIVITAMIN WITH MINERALS) tablet Take 1 tablet by mouth daily.    . pantoprazole (PROTONIX) 40 MG tablet Take 40 mg daily as needed by mouth for heartburn.      No current facility-administered medications for this visit.     Allergies as of 08/03/2017  . (No Known Allergies)    recent labs;  Christine [name] most recent labs were from 06 May 2017 and ordered through her cardiologist.  Normal white blood cell and red blood cell count hemoglobin and hematocrit, MCV and MCH.  MCHC was 31 g/dL.  Platelets were 436,000 which is elevated, differential otherwise normal, no immature cells.  Metabolic panel was ordered from 28 March 2017 she had a high fasting glucose level of 173 mg/dL.   Creatinine was 0.77 in normal range, sodium, potassium, chloride and calcium were all normal.  She did not retain CO2.   INR is 1.0, prothrombin time was 10.1 preoperatively I was also able to review the patient's myocardial perfusion imaging procedure, resting and stress EKG, and the findings as interpreted by her cardiologist.    She had a normal SPECT study no scar or  ischemia detected by nuclear imaging.    Vitals: BP 100/64   Pulse 60   Ht 5\' 1"  (1.549 m)   Wt 148 lb (67.1 kg)   BMI 27.96 kg/m  Last Weight:  Wt Readings from Last 1 Encounters:  08/03/17 148 lb (67.1 kg)   WEX:HBZJ mass index is 27.96 kg/m.     Last Height:   Ht Readings from Last 1 Encounters:  08/03/17 5\' 1"  (1.549 m)    Physical exam:  General: The patient is awake, alert and appears not in acute distress. The patient is well groomed.  Head: Normocephalic, atraumatic.  Neck is supple, normal ROM ,  Mallampati 4- partially lifted uvula- not above the tongue , mucosa is pink.  neck circumference: 16 " . Nasal airflow congested- post nasal drip. , TMJ click is evident . Retrognathia is not seen.  Cardiovascular:  Regular rate and rhythm , without  murmurs or carotid bruit, and without distended neck veins. Respiratory: Lungs are clear to auscultation. Skin:  Without evidence of edema, or rash Trunk: BMI is 28. The patient's posture is erect  Neurologic exam : The patient is awake and alert, oriented to place and time.   Memory subjective described as intact.   Attention span & concentration ability appears normal.  Speech is fluent, without dysarthria, dysphonia or aphasia.  Mood and affect are appropriate.  Cranial nerves: Pupils are equal and briskly reactive to light.  Funduscopic exam without evidence of pallor or edema. Extraocular movements  in vertical and horizontal planes intact and without nystagmus. Visual fields by finger perimetry are intact. Hearing to finger rub intact.  Facial sensation intact to fine touch. Facial motor strength is symmetric and tongue and uvula move midline. Shoulder shrug was symmetrical.  Motor exam: Normal tone, muscle bulk and symmetric strength in all extremities. Sensory:  Fine touch, pinprick and vibration were tested  in all extremities.  Vibration decreased at the right ankle and knee. Proprioception tested in the upper  extremities was normal. Coordination: Rapid alternating movements in the fingers/hands was normal. Finger-to-nose maneuver  normal without evidence of ataxia, dysmetria or tremor. Gait and station: Patient walks without assistive device.Tandem gait is unfragmented. Turns with 3 Steps. Romberg testing is negative. Deep tendon reflexes: in the  upper and lower extremities are symmetrically attenuated.    Assessment:  After physical and neurologic examination, review of laboratory studies,  Personal review of imaging studies, reports of other /same  Imaging studies, results of polysomnography and / or neurophysiology testing and pre-existing records as far as provided in visit., my assessment is   1) there is a moderate degree of hypersomnia, but the patient does not describe sleep attacks the struggling to stay awake.  She is refreshed by power naps and usually feels that her nocturnal sleep is refreshing and restoring as well.  She had isolated nights where she wakes up from her own snoring or gasping, but most nights she can sleep through without nocturia.  She has been a snorer for many years and 10 years ago a sleep study did not show the presence of sleep apnea.  It was only recently with her emerging diagnosis of coronary artery disease and cardiac palpitations that she entertained that she may have apnea.  I will order a split-night polysomnography for her given that she has a list of comorbidities that makes the presence of apnea more likely. I will order a attended split-night polysomnography because I also want to look at her heart rate in response to possible apnea.  We will also check for hypercapnia and hypoxemia.  2)  3)   The patient was advised of the nature of the diagnosed disorder , the treatment options and the  risks for general health and wellness arising from not treating the condition.   I spent more than 40 minutes of face to face time with the patient.  Greater than 50% of  time was spent in counseling and coordination of care. We have discussed the diagnosis and differential and I answered the patient's questions.    Plan:  Treatment plan and additional workup :  SPLIT study , see specification in study order.     Larey Seat, MD 08/31/3242, 01:02 AM  Certified in Neurology by ABPN Certified in [place] by [facility] [address] [place], [place] [address]

## 2017-08-04 ENCOUNTER — Encounter (HOSPITAL_COMMUNITY): Payer: Medicare HMO

## 2017-08-06 ENCOUNTER — Encounter (HOSPITAL_COMMUNITY): Payer: Medicare HMO

## 2017-08-06 DIAGNOSIS — H524 Presbyopia: Secondary | ICD-10-CM | POA: Diagnosis not present

## 2017-08-06 DIAGNOSIS — H5213 Myopia, bilateral: Secondary | ICD-10-CM | POA: Diagnosis not present

## 2017-08-06 DIAGNOSIS — H52203 Unspecified astigmatism, bilateral: Secondary | ICD-10-CM | POA: Diagnosis not present

## 2017-08-09 ENCOUNTER — Encounter (HOSPITAL_COMMUNITY): Payer: Medicare HMO

## 2017-08-11 ENCOUNTER — Encounter (HOSPITAL_COMMUNITY): Payer: Medicare HMO

## 2017-08-13 ENCOUNTER — Encounter (HOSPITAL_COMMUNITY): Payer: Medicare HMO

## 2017-08-16 ENCOUNTER — Encounter (HOSPITAL_COMMUNITY): Payer: Medicare HMO

## 2017-08-18 ENCOUNTER — Encounter (HOSPITAL_COMMUNITY): Payer: Medicare HMO

## 2017-08-20 ENCOUNTER — Encounter (HOSPITAL_COMMUNITY): Payer: Medicare HMO

## 2017-08-20 NOTE — Progress Notes (Signed)
Discharge Progress Report  Patient Details  Name: Christine [name] MRN: [id number] Date of Birth: 11-Jul-1950 Referring Provider:   Flowsheet Row CARDIAC REHAB PHASE II ORIENTATION from 06/03/2017 in [place]  Referring Provider  Nigel Mormon MD       Number of Visits: 17  Reason for Discharge:  Early Exit:  Insurance-high copay  Smoking History:  Social History   Tobacco Use  Smoking Status Never Smoker  Smokeless Tobacco Never Used    Diagnosis:  S/P CABG x 5 04/09/2017  ADL [facility]:   Initial Exercise Prescription: Initial Exercise Prescription - 06/03/17 1000    Date of Initial Exercise RX and Referring Provider          Date  06/03/17    Referring Provider  Nigel Mormon MD        Recumbant Bike          Level  2    RPM  70    Watts  30    Minutes  10    METs  3.35        NuStep          Level  2    SPM  70    Minutes  10    METs  3        Track          Laps  12    Minutes  10    METs  3.07        Prescription Details          Frequency (times per week)  3x    Duration  Progress to 30 minutes of continuous aerobic without signs/symptoms of physical distress        Intensity          THRR 40-80% of Max Heartrate  62-114    Ratings of Perceived Exertion  11-15    Perceived Dyspnea  0-4        Progression          Progression  Continue progressive overload as per policy without signs/symptoms or physical distress.        Resistance Training          Training Prescription  Yes    Weight  2lbs    Reps  10-15           Discharge Exercise Prescription (Final Exercise Prescription Changes): Exercise Prescription Changes - 07/26/17 1113    Response to Exercise          Blood Pressure (Admit)  118/70    Blood Pressure (Exercise)  120/60    Blood Pressure (Exit)  112/64    Heart Rate (Admit)  81 bpm    Heart Rate (Exercise)  99 bpm    Heart Rate (Exit)  81 bpm    Rating of Perceived  Exertion (Exercise)  13    Symptoms  none    Duration  Continue with 30 min of aerobic exercise without signs/symptoms of physical distress.    Intensity  THRR unchanged        Progression          Progression  Continue to progress workloads to maintain intensity without signs/symptoms of physical distress.    Average METs  3.4        Resistance Training          Training Prescription  Yes    Weight  3lbs    Reps  10-15    Time  10 Minutes        Recumbant Bike          Level  4    RPM  70    Watts  30    Minutes  10    METs  3        NuStep          Level  4    SPM  90    Minutes  10    METs  3.5        Track          Laps  15    Minutes  10    METs  3.6        Home Exercise Plan          Plans to continue exercise at  Home (comment) walking    Frequency  Add 2 additional days to program exercise sessions.    Initial Home Exercises Provided  06/25/17           Functional Capacity: 6 Minute Walk    6 Minute Walk    Row Name 06/03/17 1023   Phase  Initial   Distance  1651 feet   Walk Time  6 minutes   # of Rest Breaks  0   MPH  3.13   METS  4.16   RPE  13   Perceived Dyspnea   2   VO2 Peak  14.55   Symptoms  Yes (comment)   Comments  SOB +2    Resting HR  68 bpm   Resting BP  106/64   Resting Oxygen Saturation   98 %   Exercise Oxygen Saturation  during 6 min walk  99 %   Max Ex. HR  90 bpm   Max Ex. BP  126/70   2 Minute Post BP  122/78          Psychological, QOL, Others - Outcomes: PHQ 2/9: Depression screen [facility] 2/9 06/09/2017 04/20/2017  Decreased Interest 0 0  Down, Depressed, Hopeless 0 0  PHQ - 2 Score 0 0    Quality of Life: Quality of Life - 06/03/17 1028    Quality of Life Scores          Health/Function Pre  21.43 %    Socioeconomic Pre  28.29 %    Psych/Spiritual Pre  28.29 %    Family Pre  26.4 %    GLOBAL Pre  25.09 %           Personal Goals: Goals established at orientation with interventions provided  to work toward goal. Personal Goals and Risk Factors at Admission - 06/03/17 1150    Core Components/Risk Factors/Patient Goals on Admission           Weight Management  Yes;Weight Loss    Intervention  Weight Management: Develop a combined nutrition and exercise program designed to reach desired caloric intake, while maintaining appropriate intake of nutrient and fiber, sodium and fats, and appropriate energy expenditure required for the weight goal.;Weight Management: Provide education and appropriate resources to help participant work on and attain dietary goals.;Weight Management/Obesity: Establish reasonable short term and long term weight goals.;Obesity: Provide education and appropriate resources to help participant work on and attain dietary goals.    Admit Weight  152 lb 1.9 oz (69 kg)    Goal Weight: Short Term  147 lb (66.7 kg)    Goal Weight: Long Term  142 lb (64.4 kg)    Expected Outcomes  Short Term: Continue to assess and modify interventions until short term weight is achieved;Long Term: Adherence to nutrition and physical activity/exercise program aimed toward attainment of established weight goal;Weight Loss: Understanding of general recommendations for a balanced deficit meal plan, which promotes 1-2 lb weight loss per week and includes a negative energy balance of [phone] kcal/d;Understanding recommendations for meals to include 15-35% energy as protein, 25-35% energy from fat, 35-60% energy from carbohydrates, less than 200mg  of dietary cholesterol, 20-35 gm of total fiber daily;Understanding of distribution of calorie intake throughout the day with the consumption of 4-5 meals/snacks;Weight Maintenance: Understanding of the daily nutrition guidelines, which includes 25-35% calories from fat, 7% or less cal from saturated fats, less than 200mg  cholesterol, less than 1.5gm of sodium, & 5 or more servings of fruits and vegetables daily    Diabetes  Yes    Intervention  Provide  education about signs/symptoms and action to take for hypo/hyperglycemia.;Provide education about proper nutrition, including hydration, and aerobic/resistive exercise prescription along with prescribed medications to achieve blood glucose in normal ranges: Fasting glucose 65-99 mg/dL    Expected Outcomes  Short Term: Participant verbalizes understanding of the signs/symptoms and immediate care of hyper/hypoglycemia, proper foot care and importance of medication, aerobic/resistive exercise and nutrition plan for blood glucose control.;Long Term: Attainment of HbA1C < 7%.    Lipids  Yes    Intervention  Provide education and support for participant on nutrition & aerobic/resistive exercise along with prescribed medications to achieve LDL 70mg , HDL >40mg .    Expected Outcomes  Short Term: Participant states understanding of desired cholesterol values and is compliant with medications prescribed. Participant is following exercise prescription and nutrition guidelines.;Long Term: Cholesterol controlled with medications as prescribed, with individualized exercise RX and with personalized nutrition plan. Value goals: LDL < 70mg , HDL > 40 mg.            Personal Goals Discharge: Goals and Risk Factor Review    Core Components/Risk Factors/Patient Goals Review    Row Name 06/09/17 0942 06/23/17 1646 07/13/17 1334 08/02/17 1554   Personal Goals Review  Weight Management/Obesity;Lipids;Diabetes;Hypertension  Weight Management/Obesity;Lipids;Diabetes;Hypertension  Weight Management/Obesity;Lipids;Diabetes;Hypertension  Weight Management/Obesity;Lipids;Diabetes;Hypertension   Review  pt demonstrates eagerness to participate in group exercise sessions to learn lifestyle modifications.    pt demonstrates eagerness to participate in group exercise sessions to learn lifestyle modifications.    pt demonstrates compliance with medication regimen.  pt blood pressure and blood sugar well controlled at cardiac rehab.    pt demonstrates compliance with medication regimen.  pt blood pressure and blood sugar well controlled at cardiac rehab.    Expected Outcomes  pt will participate in CR exercise, nutrition and lifestyle modifications to decrease overall RF.    pt will participate in CR exercise, nutrition and lifestyle modifications to decrease overall RF.    pt will participate in CR exercise, nutrition and lifestyle modifications to decrease overall RF.    pt will participate in exercise, nutrition and lifestyle modifications to decrease overall RF.            Exercise Goals and Review: Exercise Goals    Exercise Goals    Row Name 06/03/17 0826   Increase Physical Activity  Yes   Intervention  Provide advice, education, support and counseling about physical activity/exercise needs.;Develop an individualized exercise prescription for aerobic and resistive training based on initial evaluation findings, risk stratification, comorbidities and participant's personal goals.   Expected Outcomes  Achievement of increased cardiorespiratory fitness and enhanced flexibility, muscular endurance and strength shown through measurements of functional capacity and personal statement of participant.   Increase Strength and Stamina  Yes increase walking tolerance   Intervention  Provide advice, education, support and counseling about physical activity/exercise needs.;Develop an individualized exercise prescription for aerobic and resistive training based on initial evaluation findings, risk stratification, comorbidities and participant's personal goals.   Expected Outcomes  Achievement of increased cardiorespiratory fitness and enhanced flexibility, muscular endurance and strength shown through measurements of functional capacity and personal statement of participant.   Able to understand and use rate of perceived exertion (RPE) scale  Yes   Intervention  Provide education and explanation on how to use RPE scale   Expected  Outcomes  Short Term: Able to use RPE daily in rehab to express subjective intensity level;Long Term:  Able to use RPE to guide intensity level when exercising independently   Knowledge and understanding of Target Heart Rate Range (THRR)  Yes   Intervention  Provide education and explanation of THRR including how the numbers were predicted and where they are located for reference   Expected Outcomes  Short Term: Able to state/look up THRR;Long Term: Able to use THRR to govern intensity when exercising independently;Short Term: Able to use daily as guideline for intensity in rehab   Able to check pulse independently  Yes   Intervention  Provide education and demonstration on how to check pulse in carotid and radial arteries.;Review the importance of being able to check your own pulse for safety during independent exercise   Expected Outcomes  Short Term: Able to explain why pulse checking is important during independent exercise;Long Term: Able to check pulse independently and accurately   Understanding of Exercise Prescription  Yes   Intervention  Provide education, explanation, and written materials on patient's individual exercise prescription   Expected Outcomes  Short Term: Able to explain program exercise prescription;Long Term: Able to explain home exercise prescription to exercise independently          Nutrition & Weight - Outcomes: Pre Biometrics - 06/03/17 1025    Pre Biometrics          Height  5\' 1"  (1.549 m)    Weight  152 lb 1.9 oz (69 kg)    Waist Circumference  37 inches    Hip Circumference  42 inches    Waist to Hip Ratio  0.88 %    BMI (Calculated)  28.76    Triceps Skinfold  25 mm    % Body Fat  40 %    Grip Strength  33 kg    Flexibility  13 in    Single Leg Stand  12.84 seconds          Post Biometrics - 08/11/17 1119     Post  Biometrics          Weight  150 lb 12.7 oz (68.4 kg)    BMI (Calculated)  28.51           Nutrition: Nutrition Therapy &  Goals - 06/04/17 0849    Nutrition Therapy          Diet  Carb Modified, Heart Healthy        Personal Nutrition Goals          Nutrition Goal  Pt to identify food quantities necessary to achieve weight loss of 6-24 lb (2.7-10.9 kg) at graduation from cardiac rehab.    Personal Goal #2  Pt to have a better understanding of diabetes including how to manage diet and blood glucose levels.    Personal Goal #3  Improved blood glucose control as evidenced by pt's A1c trending from 9.0 toward less than 7.0.        Intervention Plan          Intervention  Prescribe, educate and counsel regarding individualized specific dietary modifications aiming towards targeted core components such as weight, hypertension, lipid management, diabetes, heart failure and other comorbidities.    Expected Outcomes  Short Term Goal: Understand basic principles of dietary content, such as calories, fat, sodium, cholesterol and nutrients.;Long Term Goal: Adherence to prescribed nutrition plan.           Nutrition Discharge: Nutrition Assessments - 06/04/17 0850    MEDFICTS Scores          Pre Score  32           Education Questionnaire Score: Knowledge Questionnaire Score - 06/03/17 1026    Knowledge Questionnaire Score          Pre Score  17/24           Goals reviewed with patient; copy given to patient.

## 2017-08-23 ENCOUNTER — Encounter (HOSPITAL_COMMUNITY): Payer: Medicare HMO

## 2017-08-25 ENCOUNTER — Encounter (HOSPITAL_COMMUNITY): Payer: Medicare HMO

## 2017-08-27 ENCOUNTER — Encounter (HOSPITAL_COMMUNITY): Payer: Medicare HMO

## 2017-08-30 ENCOUNTER — Encounter (HOSPITAL_COMMUNITY): Payer: Medicare HMO

## 2017-08-31 NOTE — Addendum Note (Signed)
Encounter addended by: Lowell Guitar, RN on: 08/31/2017 3:46 PM  Actions taken: Episode resolved

## 2017-08-31 NOTE — Addendum Note (Signed)
Encounter addended by: Lowell Guitar, RN on: 08/31/2017 3:45 PM  Actions taken: Sign clinical note

## 2017-08-31 NOTE — Progress Notes (Signed)
Discharge Progress Report  Patient Details  Name: Christine [name] MRN: [id number] Date of Birth: May 12, 1951 Referring Provider:   Flowsheet Row CARDIAC REHAB PHASE II ORIENTATION from 06/03/2017 in [facility]  Referring Provider  Nigel Mormon MD       Number of Visits: 17   Reason for Discharge:  Early Exit:  Insurance-$45.00 copay  Smoking History:  Social History   Tobacco Use  Smoking Status Never Smoker  Smokeless Tobacco Never Used    Diagnosis:  S/P CABG x 5 04/09/2017  ADL UCSD:   Initial Exercise Prescription: Initial Exercise Prescription - 06/03/17 1000    Date of Initial Exercise RX and Referring Provider          Date  06/03/17    Referring Provider  Nigel Mormon MD        Recumbant Bike          Level  2    RPM  70    Watts  30    Minutes  10    METs  3.35        NuStep          Level  2    SPM  70    Minutes  10    METs  3        Track          Laps  12    Minutes  10    METs  3.07        Prescription Details          Frequency (times per week)  3x    Duration  Progress to 30 minutes of continuous aerobic without signs/symptoms of physical distress        Intensity          THRR 40-80% of Max Heartrate  62-114    Ratings of Perceived Exertion  11-15    Perceived Dyspnea  0-4        Progression          Progression  Continue progressive overload as per policy without signs/symptoms or physical distress.        Resistance Training          Training Prescription  Yes    Weight  2lbs    Reps  10-15           Discharge Exercise Prescription (Final Exercise Prescription Changes): Exercise Prescription Changes - 07/26/17 1113    Response to Exercise          Blood Pressure (Admit)  118/70    Blood Pressure (Exercise)  120/60    Blood Pressure (Exit)  112/64    Heart Rate (Admit)  81 bpm    Heart Rate (Exercise)  99 bpm    Heart Rate (Exit)  81 bpm    Rating of  Perceived Exertion (Exercise)  13    Symptoms  none    Duration  Continue with 30 min of aerobic exercise without signs/symptoms of physical distress.    Intensity  THRR unchanged        Progression          Progression  Continue to progress workloads to maintain intensity without signs/symptoms of physical distress.    Average METs  3.4        Resistance Training          Training Prescription  Yes    Weight  3lbs    Reps  10-15    Time  10 Minutes        Recumbant Bike          Level  4    RPM  70    Watts  30    Minutes  10    METs  3        NuStep          Level  4    SPM  90    Minutes  10    METs  3.5        Track          Laps  15    Minutes  10    METs  3.6        Home Exercise Plan          Plans to continue exercise at  Home (comment) walking    Frequency  Add 2 additional days to program exercise sessions.    Initial Home Exercises Provided  06/25/17           Functional Capacity: 6 Minute Walk    6 Minute Walk    Row Name 06/03/17 1023   Phase  Initial   Distance  1651 feet   Walk Time  6 minutes   # of Rest Breaks  0   MPH  3.13   METS  4.16   RPE  13   Perceived Dyspnea   2   VO2 Peak  14.55   Symptoms  Yes (comment)   Comments  SOB +2    Resting HR  68 bpm   Resting BP  106/64   Resting Oxygen Saturation   98 %   Exercise Oxygen Saturation  during 6 min walk  99 %   Max Ex. HR  90 bpm   Max Ex. BP  126/70   2 Minute Post BP  122/78          Psychological, QOL, Others - Outcomes: PHQ 2/9: Depression screen [facility] 2/9 06/09/2017 04/20/2017  Decreased Interest 0 0  Down, Depressed, Hopeless 0 0  PHQ - 2 Score 0 0    Quality of Life: Quality of Life - 06/03/17 1028    Quality of Life Scores          Health/Function Pre  21.43 %    Socioeconomic Pre  28.29 %    Psych/Spiritual Pre  28.29 %    Family Pre  26.4 %    GLOBAL Pre  25.09 %           Personal Goals: Goals established at orientation with  interventions provided to work toward goal. Personal Goals and Risk Factors at Admission - 06/03/17 1150    Core Components/Risk Factors/Patient Goals on Admission           Weight Management  Yes;Weight Loss    Intervention  Weight Management: Develop a combined nutrition and exercise program designed to reach desired caloric intake, while maintaining appropriate intake of nutrient and fiber, sodium and fats, and appropriate energy expenditure required for the weight goal.;Weight Management: Provide education and appropriate resources to help participant work on and attain dietary goals.;Weight Management/Obesity: Establish reasonable short term and long term weight goals.;Obesity: Provide education and appropriate resources to help participant work on and attain dietary goals.    Admit Weight  152 lb 1.9 oz (69 kg)    Goal Weight: Short Term  147 lb (66.7 kg)    Goal Weight: Long Term  142 lb (64.4 kg)    Expected Outcomes  Short Term: Continue to assess and modify interventions until short term weight is achieved;Long Term: Adherence to nutrition and physical activity/exercise program aimed toward attainment of established weight goal;Weight Loss: Understanding of general recommendations for a balanced deficit meal plan, which promotes 1-2 lb weight loss per week and includes a negative energy balance of [phone] kcal/d;Understanding recommendations for meals to include 15-35% energy as protein, 25-35% energy from fat, 35-60% energy from carbohydrates, less than 200mg  of dietary cholesterol, 20-35 gm of total fiber daily;Understanding of distribution of calorie intake throughout the day with the consumption of 4-5 meals/snacks;Weight Maintenance: Understanding of the daily nutrition guidelines, which includes 25-35% calories from fat, 7% or less cal from saturated fats, less than 200mg  cholesterol, less than 1.5gm of sodium, & 5 or more servings of fruits and vegetables daily    Diabetes  Yes     Intervention  Provide education about signs/symptoms and action to take for hypo/hyperglycemia.;Provide education about proper nutrition, including hydration, and aerobic/resistive exercise prescription along with prescribed medications to achieve blood glucose in normal ranges: Fasting glucose 65-99 mg/dL    Expected Outcomes  Short Term: Participant verbalizes understanding of the signs/symptoms and immediate care of hyper/hypoglycemia, proper foot care and importance of medication, aerobic/resistive exercise and nutrition plan for blood glucose control.;Long Term: Attainment of HbA1C < 7%.    Lipids  Yes    Intervention  Provide education and support for participant on nutrition & aerobic/resistive exercise along with prescribed medications to achieve LDL 70mg , HDL >40mg .    Expected Outcomes  Short Term: Participant states understanding of desired cholesterol values and is compliant with medications prescribed. Participant is following exercise prescription and nutrition guidelines.;Long Term: Cholesterol controlled with medications as prescribed, with individualized exercise RX and with personalized nutrition plan. Value goals: LDL < 70mg , HDL > 40 mg.            Personal Goals Discharge: Goals and Risk Factor Review    Core Components/Risk Factors/Patient Goals Review    Row Name 06/09/17 0942 06/23/17 1646 07/13/17 1334 08/02/17 1554   Personal Goals Review  Weight Management/Obesity;Lipids;Diabetes;Hypertension  Weight Management/Obesity;Lipids;Diabetes;Hypertension  Weight Management/Obesity;Lipids;Diabetes;Hypertension  Weight Management/Obesity;Lipids;Diabetes;Hypertension   Review  pt demonstrates eagerness to participate in group exercise sessions to learn lifestyle modifications.    pt demonstrates eagerness to participate in group exercise sessions to learn lifestyle modifications.    pt demonstrates compliance with medication regimen.  pt blood pressure and blood sugar well controlled  at cardiac rehab.   pt demonstrates compliance with medication regimen.  pt blood pressure and blood sugar well controlled at cardiac rehab.    Expected Outcomes  pt will participate in CR exercise, nutrition and lifestyle modifications to decrease overall RF.    pt will participate in CR exercise, nutrition and lifestyle modifications to decrease overall RF.    pt will participate in CR exercise, nutrition and lifestyle modifications to decrease overall RF.    pt will participate in exercise, nutrition and lifestyle modifications to decrease overall RF.            Exercise Goals and Review: Exercise Goals    Exercise Goals    Row Name 06/03/17 0826   Increase Physical Activity  Yes   Intervention  Provide advice, education, support and counseling about physical activity/exercise needs.;Develop an individualized exercise prescription for aerobic and resistive training based on initial evaluation findings, risk stratification, comorbidities and participant's personal goals.   Expected Outcomes  Achievement of increased cardiorespiratory fitness and enhanced flexibility, muscular endurance and strength shown through measurements of functional capacity and personal statement of participant.   Increase Strength and Stamina  Yes increase walking tolerance   Intervention  Provide advice, education, support and counseling about physical activity/exercise needs.;Develop an individualized exercise prescription for aerobic and resistive training based on initial evaluation findings, risk stratification, comorbidities and participant's personal goals.   Expected Outcomes  Achievement of increased cardiorespiratory fitness and enhanced flexibility, muscular endurance and strength shown through measurements of functional capacity and personal statement of participant.   Able to understand and use rate of perceived exertion (RPE) scale  Yes   Intervention  Provide education and explanation on how to use RPE  scale   Expected Outcomes  Short Term: Able to use RPE daily in rehab to express subjective intensity level;Long Term:  Able to use RPE to guide intensity level when exercising independently   Knowledge and understanding of Target Heart Rate Range (THRR)  Yes   Intervention  Provide education and explanation of THRR including how the numbers were predicted and where they are located for reference   Expected Outcomes  Short Term: Able to state/look up THRR;Long Term: Able to use THRR to govern intensity when exercising independently;Short Term: Able to use daily as guideline for intensity in rehab   Able to check pulse independently  Yes   Intervention  Provide education and demonstration on how to check pulse in carotid and radial arteries.;Review the importance of being able to check your own pulse for safety during independent exercise   Expected Outcomes  Short Term: Able to explain why pulse checking is important during independent exercise;Long Term: Able to check pulse independently and accurately   Understanding of Exercise Prescription  Yes   Intervention  Provide education, explanation, and written materials on patient's individual exercise prescription   Expected Outcomes  Short Term: Able to explain program exercise prescription;Long Term: Able to explain home exercise prescription to exercise independently          Nutrition & Weight - Outcomes: Pre Biometrics - 06/03/17 1025    Pre Biometrics          Height  5\' 1"  (1.549 m)    Weight  152 lb 1.9 oz (69 kg)    Waist Circumference  37 inches    Hip Circumference  42 inches    Waist to Hip Ratio  0.88 %    BMI (Calculated)  28.76    Triceps Skinfold  25 mm    % Body Fat  40 %    Grip Strength  33 kg    Flexibility  13 in    Single Leg Stand  12.84 seconds          Post Biometrics - 08/11/17 1119     Post  Biometrics          Weight  150 lb 12.7 oz (68.4 kg)    BMI (Calculated)  28.51            Nutrition: Nutrition Therapy & Goals - 06/04/17 0849    Nutrition Therapy          Diet  Carb Modified, Heart Healthy        Personal Nutrition Goals          Nutrition Goal  Pt to identify food quantities necessary to achieve weight loss of 6-24 lb (2.7-10.9 kg) at graduation from cardiac rehab.    Personal Goal #2  Pt to have a better understanding of diabetes including how to manage diet and blood glucose levels.    Personal Goal #3  Improved blood glucose control as evidenced by pt's A1c trending from 9.0 toward less than 7.0.        Intervention Plan          Intervention  Prescribe, educate and counsel regarding individualized specific dietary modifications aiming towards targeted core components such as weight, hypertension, lipid management, diabetes, heart failure and other comorbidities.    Expected Outcomes  Short Term Goal: Understand basic principles of dietary content, such as calories, fat, sodium, cholesterol and nutrients.;Long Term Goal: Adherence to prescribed nutrition plan.           Nutrition Discharge: Nutrition Assessments - 06/04/17 0850    MEDFICTS Scores          Pre Score  32           Education Questionnaire Score: Knowledge Questionnaire Score - 06/03/17 1026    Knowledge Questionnaire Score          Pre Score  17/24           Goals reviewed with patient; copy given to patient.

## 2017-10-06 ENCOUNTER — Telehealth: Payer: Self-pay | Admitting: Neurology

## 2017-10-06 NOTE — Telephone Encounter (Signed)
Pt has cancelled her sleep study for the second time. Pt stated she will call us back when she is ready to reschedule.

## 2018-01-14 DIAGNOSIS — R7303 Prediabetes: Secondary | ICD-10-CM | POA: Diagnosis not present

## 2018-01-14 DIAGNOSIS — Z951 Presence of aortocoronary bypass graft: Secondary | ICD-10-CM | POA: Diagnosis not present

## 2018-01-14 DIAGNOSIS — I25119 Atherosclerotic heart disease of native coronary artery with unspecified angina pectoris: Secondary | ICD-10-CM | POA: Diagnosis not present

## 2018-01-14 DIAGNOSIS — I491 Atrial premature depolarization: Secondary | ICD-10-CM | POA: Diagnosis not present

## 2018-01-28 DIAGNOSIS — R0789 Other chest pain: Secondary | ICD-10-CM | POA: Diagnosis not present

## 2018-01-28 DIAGNOSIS — R0602 Shortness of breath: Secondary | ICD-10-CM | POA: Diagnosis not present

## 2018-02-07 DIAGNOSIS — I25119 Atherosclerotic heart disease of native coronary artery with unspecified angina pectoris: Secondary | ICD-10-CM | POA: Diagnosis not present

## 2018-02-07 DIAGNOSIS — I491 Atrial premature depolarization: Secondary | ICD-10-CM | POA: Diagnosis not present

## 2018-02-07 DIAGNOSIS — Z951 Presence of aortocoronary bypass graft: Secondary | ICD-10-CM | POA: Diagnosis not present

## 2018-02-07 DIAGNOSIS — R7303 Prediabetes: Secondary | ICD-10-CM | POA: Diagnosis not present

## 2018-03-28 DIAGNOSIS — Z1231 Encounter for screening mammogram for malignant neoplasm of breast: Secondary | ICD-10-CM | POA: Diagnosis not present

## 2018-03-28 DIAGNOSIS — Z6827 Body mass index (BMI) 27.0-27.9, adult: Secondary | ICD-10-CM | POA: Diagnosis not present

## 2018-03-28 DIAGNOSIS — Z01419 Encounter for gynecological examination (general) (routine) without abnormal findings: Secondary | ICD-10-CM | POA: Diagnosis not present

## 2018-05-02 DIAGNOSIS — B373 Candidiasis of vulva and vagina: Secondary | ICD-10-CM | POA: Diagnosis not present

## 2018-05-18 DIAGNOSIS — H9313 Tinnitus, bilateral: Secondary | ICD-10-CM | POA: Diagnosis not present

## 2018-05-18 DIAGNOSIS — R1312 Dysphagia, oropharyngeal phase: Secondary | ICD-10-CM | POA: Diagnosis not present

## 2018-05-18 DIAGNOSIS — H903 Sensorineural hearing loss, bilateral: Secondary | ICD-10-CM | POA: Diagnosis not present

## 2018-05-18 DIAGNOSIS — K219 Gastro-esophageal reflux disease without esophagitis: Secondary | ICD-10-CM | POA: Diagnosis not present

## 2018-05-31 ENCOUNTER — Other Ambulatory Visit: Payer: Self-pay | Admitting: Otolaryngology

## 2018-05-31 DIAGNOSIS — H9042 Sensorineural hearing loss, unilateral, left ear, with unrestricted hearing on the contralateral side: Secondary | ICD-10-CM

## 2018-05-31 DIAGNOSIS — IMO0001 Reserved for inherently not codable concepts without codable children: Secondary | ICD-10-CM

## 2018-06-02 ENCOUNTER — Ambulatory Visit
Admission: RE | Admit: 2018-06-02 | Discharge: 2018-06-02 | Disposition: A | Discharge status: Home / Self Care | Payer: Medicare HMO | Source: Ambulatory Visit | Attending: Otolaryngology | Admitting: Otolaryngology

## 2018-06-02 DIAGNOSIS — H9042 Sensorineural hearing loss, unilateral, left ear, with unrestricted hearing on the contralateral side: Secondary | ICD-10-CM

## 2018-06-02 DIAGNOSIS — IMO0001 Reserved for inherently not codable concepts without codable children: Secondary | ICD-10-CM

## 2018-06-02 DIAGNOSIS — H9192 Unspecified hearing loss, left ear: Secondary | ICD-10-CM | POA: Diagnosis not present

## 2018-06-02 MED ORDER — GADOBENATE DIMEGLUMINE 529 MG/ML IV SOLN
13.0000 mL | Freq: Once | INTRAVENOUS | Status: AC | PRN
  Administered 2018-06-02: 13 mL via INTRAVENOUS

## 2018-06-03 DIAGNOSIS — Z951 Presence of aortocoronary bypass graft: Secondary | ICD-10-CM | POA: Diagnosis not present

## 2018-06-03 DIAGNOSIS — E785 Hyperlipidemia, unspecified: Secondary | ICD-10-CM | POA: Diagnosis not present

## 2018-06-03 DIAGNOSIS — I2510 Atherosclerotic heart disease of native coronary artery without angina pectoris: Secondary | ICD-10-CM | POA: Diagnosis not present

## 2018-06-03 DIAGNOSIS — R7303 Prediabetes: Secondary | ICD-10-CM | POA: Diagnosis not present

## 2018-06-08 ENCOUNTER — Other Ambulatory Visit (HOSPITAL_COMMUNITY): Payer: Self-pay | Admitting: Otolaryngology

## 2018-06-08 ENCOUNTER — Other Ambulatory Visit: Payer: Self-pay | Admitting: Otolaryngology

## 2018-06-08 DIAGNOSIS — R1310 Dysphagia, unspecified: Secondary | ICD-10-CM

## 2018-06-13 ENCOUNTER — Ambulatory Visit (HOSPITAL_COMMUNITY)
Admission: RE | Admit: 2018-06-13 | Discharge: 2018-06-13 | Disposition: A | Discharge status: Home / Self Care | Payer: Medicare HMO | Source: Ambulatory Visit | Attending: Otolaryngology | Admitting: Otolaryngology

## 2018-06-13 DIAGNOSIS — R1310 Dysphagia, unspecified: Secondary | ICD-10-CM | POA: Diagnosis not present

## 2018-06-13 DIAGNOSIS — R933 Abnormal findings on diagnostic imaging of other parts of digestive tract: Secondary | ICD-10-CM | POA: Insufficient documentation

## 2018-06-13 DIAGNOSIS — K224 Dyskinesia of esophagus: Secondary | ICD-10-CM | POA: Diagnosis not present

## 2018-06-15 DIAGNOSIS — H903 Sensorineural hearing loss, bilateral: Secondary | ICD-10-CM | POA: Diagnosis not present

## 2018-06-15 DIAGNOSIS — K219 Gastro-esophageal reflux disease without esophagitis: Secondary | ICD-10-CM | POA: Diagnosis not present

## 2018-06-15 DIAGNOSIS — R1312 Dysphagia, oropharyngeal phase: Secondary | ICD-10-CM | POA: Diagnosis not present

## 2018-06-29 DIAGNOSIS — R69 Illness, unspecified: Secondary | ICD-10-CM | POA: Diagnosis not present

## 2018-08-16 DIAGNOSIS — H52203 Unspecified astigmatism, bilateral: Secondary | ICD-10-CM | POA: Diagnosis not present

## 2018-08-16 DIAGNOSIS — H5213 Myopia, bilateral: Secondary | ICD-10-CM | POA: Diagnosis not present

## 2018-08-16 DIAGNOSIS — H25813 Combined forms of age-related cataract, bilateral: Secondary | ICD-10-CM | POA: Diagnosis not present

## 2018-08-16 DIAGNOSIS — H524 Presbyopia: Secondary | ICD-10-CM | POA: Diagnosis not present

## 2018-10-26 DIAGNOSIS — B373 Candidiasis of vulva and vagina: Secondary | ICD-10-CM | POA: Diagnosis not present

## 2018-10-26 DIAGNOSIS — R350 Frequency of micturition: Secondary | ICD-10-CM | POA: Diagnosis not present

## 2018-10-28 DIAGNOSIS — K219 Gastro-esophageal reflux disease without esophagitis: Secondary | ICD-10-CM | POA: Diagnosis not present

## 2018-11-16 DIAGNOSIS — Z1159 Encounter for screening for other viral diseases: Secondary | ICD-10-CM | POA: Diagnosis not present

## 2018-11-22 DIAGNOSIS — R1310 Dysphagia, unspecified: Secondary | ICD-10-CM | POA: Diagnosis not present

## 2018-11-22 DIAGNOSIS — K210 Gastro-esophageal reflux disease with esophagitis: Secondary | ICD-10-CM | POA: Diagnosis not present

## 2018-11-22 DIAGNOSIS — K5730 Diverticulosis of large intestine without perforation or abscess without bleeding: Secondary | ICD-10-CM | POA: Diagnosis not present

## 2018-11-22 DIAGNOSIS — Z800 Family history of malignant neoplasm of digestive organs: Secondary | ICD-10-CM | POA: Diagnosis not present

## 2018-11-22 DIAGNOSIS — Z8371 Family history of colonic polyps: Secondary | ICD-10-CM | POA: Diagnosis not present

## 2018-11-22 DIAGNOSIS — K219 Gastro-esophageal reflux disease without esophagitis: Secondary | ICD-10-CM | POA: Diagnosis not present

## 2018-11-22 DIAGNOSIS — Z1211 Encounter for screening for malignant neoplasm of colon: Secondary | ICD-10-CM | POA: Diagnosis not present

## 2018-11-22 DIAGNOSIS — K648 Other hemorrhoids: Secondary | ICD-10-CM | POA: Diagnosis not present

## 2018-11-22 DIAGNOSIS — B3781 Candidal esophagitis: Secondary | ICD-10-CM | POA: Diagnosis not present

## 2018-11-22 DIAGNOSIS — D122 Benign neoplasm of ascending colon: Secondary | ICD-10-CM | POA: Diagnosis not present

## 2018-11-22 DIAGNOSIS — K635 Polyp of colon: Secondary | ICD-10-CM | POA: Diagnosis not present

## 2018-11-23 DIAGNOSIS — B3781 Candidal esophagitis: Secondary | ICD-10-CM | POA: Diagnosis not present

## 2018-11-23 DIAGNOSIS — K635 Polyp of colon: Secondary | ICD-10-CM | POA: Diagnosis not present

## 2018-11-23 DIAGNOSIS — D122 Benign neoplasm of ascending colon: Secondary | ICD-10-CM | POA: Diagnosis not present

## 2018-12-09 ENCOUNTER — Ambulatory Visit: Payer: Self-pay | Admitting: Cardiology

## 2018-12-19 ENCOUNTER — Ambulatory Visit: Payer: Self-pay | Admitting: Cardiology

## 2018-12-21 DIAGNOSIS — K648 Other hemorrhoids: Secondary | ICD-10-CM | POA: Diagnosis not present

## 2018-12-21 DIAGNOSIS — R1310 Dysphagia, unspecified: Secondary | ICD-10-CM | POA: Diagnosis not present

## 2019-01-16 ENCOUNTER — Ambulatory Visit: Payer: Medicare HMO | Admitting: Cardiology

## 2019-01-16 ENCOUNTER — Encounter: Payer: Self-pay | Admitting: Cardiology

## 2019-01-16 ENCOUNTER — Other Ambulatory Visit: Payer: Self-pay

## 2019-01-16 VITALS — BP 126/68 | HR 60 | Ht 62.0 in | Wt 140.6 lb

## 2019-01-16 DIAGNOSIS — I25708 Atherosclerosis of coronary artery bypass graft(s), unspecified, with other forms of angina pectoris: Secondary | ICD-10-CM

## 2019-01-16 DIAGNOSIS — E782 Mixed hyperlipidemia: Secondary | ICD-10-CM | POA: Diagnosis not present

## 2019-01-16 DIAGNOSIS — E1169 Type 2 diabetes mellitus with other specified complication: Secondary | ICD-10-CM | POA: Insufficient documentation

## 2019-01-16 MED ORDER — NITROGLYCERIN 0.4 MG SL SUBL: 0.4000 mg | SUBLINGUAL_TABLET | SUBLINGUAL | 3 refills | Status: DC | PRN

## 2019-01-16 NOTE — Progress Notes (Signed)
Follow up visit  Subjective:   Christine [name], female    DOB: [birth year], 68 y.o.   MRN: [id number]   Chief Complaint  Patient presents with  . Coronary Artery Disease  . Follow-up    6 month    HPI  68 year old Caucasian female with prediabetes, history of basal cell carcinoma, severe multivessel coronary artery disease status post CABG 5 on 04/09/2017, hypertriglyceridemia.   Patient's physical activity has decreased during the pandemic since closure of her gym. She continues to have episodes of "throat pain", with or without activity. She reportedly saw ENT, but was workup was unremarkable. She also has separate sharp chest pains that only last for few seconds.   Past Medical History:  Diagnosis Date  . Angina pectoris ([place])   . Arthritis   . CAD, multiple vessel   . Cancer (HCC)    18 MONTHS  BASAL CELL   . Chest pain ON EXERTION   . Dyspnea    W/ EXERTION   . GERD (gastroesophageal reflux disease)   . H/O echocardiogram 1990   NORMAL  . Hyperlipidemia   . PAC (premature atrial contraction)   . Pre-diabetes      Past Surgical History:  Procedure Laterality Date  . BASAL CELL CARCINOMA EXCISION     19 MO OLD  . CARDIAC CATHETERIZATION    . CORONARY ARTERY BYPASS GRAFT N/A 04/09/2017   Procedure: CORONARY ARTERY BYPASS GRAFTING (CABG)x5 USING RIGHT SAPHENOUS VEIN, HARVESTED ENDOSCOPICALLY AND LEFT INTERNAL MAMMARY ARTERY;  Surgeon: Melrose Nakayama, MD;  Location: [facility];  Service: Open Heart Surgery;  Laterality: N/A;  . LEFT HEART CATH AND CORONARY ANGIOGRAPHY N/A 04/01/2017   Procedure: LEFT HEART CATH AND CORONARY ANGIOGRAPHY;  Surgeon: Nigel Mormon, MD;  Location: [facility];  Service: Cardiovascular;  Laterality: N/A;  . TEE WITHOUT CARDIOVERSION N/A 04/09/2017   Procedure: TRANSESOPHAGEAL ECHOCARDIOGRAM (TEE);  Surgeon: Melrose Nakayama, MD;  Location: [facility];  Service: Open Heart Surgery;  Laterality: N/A;     Social History    Socioeconomic History  . Marital status: Married    Spouse name: Eddie Dibbles  . Number of children: Not on file  . Years of education: Not on file  . Highest education level: Not on file  Occupational History  . Not on file  Social Needs  . Financial resource strain: Not on file  . Food insecurity    Worry: Not on file    Inability: Not on file  . Transportation needs    Medical: Not on file    Non-medical: Not on file  Tobacco Use  . Smoking status: Never Smoker  . Smokeless tobacco: Never Used  Substance and Sexual Activity  . Alcohol use: No    Frequency: Never  . Drug use: No  . Sexual activity: Not on file  Lifestyle  . Physical activity    Days per week: 4 days    Minutes per session: 30 min  . Stress: To some extent  Relationships  . Social Herbalist on phone: Not on file    Gets together: Not on file    Attends religious service: Not on file    Active member of club or organization: Not on file    Attends meetings of clubs or organizations: Not on file    Relationship status: Not on file  . Intimate partner violence    Fear of current or ex partner: Not on file  Emotionally abused: Not on file    Physically abused: Not on file    Forced sexual activity: Not on file  Other Topics Concern  . Not on file  Social History Narrative  . Not on file     Family History  Problem Relation Age of Onset  . Cancer Mother        LUNG  . Cancer Father        COLON, MELANOMA  . Heart disease Father 57       MI   . Stroke Father   . Heart disease Brother 61       MI     Current Outpatient Medications on File Prior to Visit  Medication Sig Dispense Refill  . aspirin EC 325 MG tablet Take 81 mg by mouth daily.    Marland Kitchen atorvastatin (LIPITOR) 20 MG tablet Take 20 mg by mouth daily.    . metFORMIN (GLUCOPHAGE) 1000 MG tablet Take 1 tablet (1,000 mg total) 2 (two) times daily with a meal by mouth. 60 tablet 1  . metoprolol succinate (TOPROL-XL) 25 MG 24 hr  tablet Take 1 tablet (25 mg total) by mouth 2 (two) times daily. 60 tablet 3  . Multiple Vitamins-Minerals (MULTIVITAMIN WITH MINERALS) tablet Take 1 tablet by mouth daily.    . pantoprazole (PROTONIX) 40 MG tablet Take 40 mg daily as needed by mouth for heartburn.      No current facility-administered medications on file prior to visit.     Cardiovascular studies:  EKG 01/16/2019: Sinus rhythm 60 bpm.  Left axis deviation. Poor R wave progression.   Exercise myoview stress 01/28/2018:  1. The patient performed treadmill exercise using Bruce protocol, completing 6:13 minutes. The patient completed an estimated workload of 7.3 METS, reaching of the maximum predicted heart rate. Low exercise capacity for age. Normal hemodynamic response. Patient developed exercise induced chest pain that relived with rest. The stress electrocardiogram showed sinus tachycardia, normal stress conduction and no stress arrhythmias. Stress EKG sowed isolated T wave inversion in lead aVL that persisted 4 min into recovery. Stress EKG is equivocal for ischemia.  2. The overall quality of the study is good.  Left ventricular cavity is noted to be normal on the rest and stress studies.  Gated SPECT images reveal normal myocardial thickening and wall motion.  The left ventricular ejection fraction was calculated or visually estimated to be 68% Small area of basal inferoseptal perfusion defect with no significant reversibility, likely due to breast attenuation artifact, with imaging performed in sitting position. .   3. Low risk SPECT imaging study. Recommend clinical correlation given patient's exercise induce chest pain and localized EKG changes, as described.  Lower extremity arterial duplex 06/02/2017: No hemodynamically significant stenoses are identified in the right lower extremity arterial system. No hemodynamically significant stenoses are identified in the left lower extremity arterial system. This exam reveals  normal perfusion of the right lower extremity RABI 0.98 and mildly decreased perfusion of the left lower extremity, noted at the post tibial artery level ABI 0.94. However there was triphasic (normal waveform) noted at the level of the ankle.  Clinical correlation recommended.  Recent labs: 03/15/2017:  Cholesterol 301, triglycerides 585, HDL 42, LDL 150.  05/05/2017: H/H 11.5/37.1.  MCV 27.  Triglycerides 436.  Review of Systems  Constitution: Negative for decreased appetite, malaise/fatigue, weight gain and weight loss.  HENT: Negative for congestion.   Eyes: Negative for visual disturbance.  Cardiovascular: Positive for chest pain. Negative for dyspnea  on exertion, leg swelling, palpitations and syncope.  Respiratory: Negative for cough.   Endocrine: Negative for cold intolerance.  Hematologic/Lymphatic: Does not bruise/bleed easily.  Skin: Negative for itching and rash.  Musculoskeletal: Negative for myalgias.  Gastrointestinal: Negative for abdominal pain, nausea and vomiting.  Genitourinary: Negative for dysuria.  Neurological: Negative for dizziness and weakness.  Psychiatric/Behavioral: The patient is not nervous/anxious.   All other systems reviewed and are negative.       Vitals:   01/16/19 1331  BP: 126/68  Pulse: 60  SpO2: 98%    Body mass index is 25.72 kg/m. Filed Weights   01/16/19 1331  Weight: 140 lb 9.6 oz (63.8 kg)     Objective:   Physical Exam  Constitutional: She is oriented to person, place, and time. She appears well-developed and well-nourished. No distress.  HENT:  Head: Normocephalic and atraumatic.  Eyes: Pupils are equal, round, and reactive to light. Conjunctivae are normal.  Neck: No JVD present.  Cardiovascular: Normal rate, regular rhythm and intact distal pulses.  Pulmonary/Chest: Effort normal and breath sounds normal. She has no wheezes. She has no rales.  Sternotomy scar  Abdominal: Soft. Bowel sounds are normal. There is no  rebound.  Musculoskeletal:        General: No edema.  Lymphadenopathy:    She has no cervical adenopathy.  Neurological: She is alert and oriented to person, place, and time. No cranial nerve deficit.  Skin: Skin is warm and dry.  Psychiatric: She has a normal mood and affect.  Nursing note and vitals reviewed.         Assessment & Recommendations:   68 year old Caucasian female with prediabetes, history of basal cell carcinoma, severe multivessel coronary artery disease status post CABG 5 on 04/09/2017, hypertriglyceridemia.   CAD: Continues to have atypical angina symptoms. Her stress test has always been under whelming, probably due to her low LV cavity. Continue Aspirin, statin, metoprolol. I have prescribed her SL NTG for as needed use. I will see her back in 4 weeks virtually to discuss her symptoms. If symptoms persist, recommend coronary and bypass graft angiography for definitive evaluation.   Hyperlipidemia:  Elevated TG previously. Will repeat fasting lipid panel. Continue lipitor 20 mg for now.    Nigel Mormon, MD [facility] Pager: [phone] Office: [phone] If no answer Cell [phone]

## 2019-02-15 DIAGNOSIS — E782 Mixed hyperlipidemia: Secondary | ICD-10-CM | POA: Diagnosis not present

## 2019-02-16 LAB — LIPID PANEL
Chol/HDL Ratio: 3.8 ratio (ref 0.0–4.4)
Cholesterol, Total: 216 mg/dL — ABNORMAL HIGH (ref 100–199)
HDL: 57 mg/dL (ref >39)
LDL Chol Calc (NIH): 123 mg/dL — ABNORMAL HIGH (ref 0–99)
Triglycerides: 206 mg/dL — ABNORMAL HIGH (ref 0–149)
VLDL Cholesterol Cal: 36 mg/dL (ref 5–40)

## 2019-02-16 NOTE — Progress Notes (Signed)
Follow up visit  Subjective:   Christine [name], female    DOB: 1951/05/03, 68 y.o.   MRN: [id number]  I connected with the patient on 02/23/2019 by a telephone call and verified that I am speaking with the correct person using two identifiers.     I offered the patient a video enabled application for a virtual visit. Unfortunately, this could not be accomplished due to technical difficulties/lack of video enabled phone/computer. I discussed the limitations of evaluation and management by telemedicine and the availability of in person appointments. The patient expressed understanding and agreed to proceed.   This visit type was conducted due to national recommendations for restrictions regarding the COVID-19 Pandemic (e.g. social distancing).  This format is felt to be most appropriate for this patient at this time.  All issues noted in this document were discussed and addressed.  No physical exam was performed (except for noted visual exam findings with Tele health visits).  The patient has consented to conduct a Tele health visit and understands insurance will be billed.    Chief Complaint  Patient presents with  . Chest Pain    HPI  68 year old Caucasian female with prediabetes, history of basal cell carcinoma, severe multivessel coronary artery disease status post CABG 5 on 04/09/2017, hypertriglyceridemia.   Patient continues to have episodes of chest pain similar to her pain prior to CABG. It occurs more frequently than prior to CABG, but intensity is less. Moreover, pain does not happen to with exertion, but seems to occur more at rest.   Past Medical History:  Diagnosis Date  . Angina pectoris ([place])   . Arthritis   . CAD, multiple vessel   . Cancer (HCC)    18 MONTHS  BASAL CELL   . Chest pain ON EXERTION   . Dyspnea    W/ EXERTION   . GERD (gastroesophageal reflux disease)   . H/O echocardiogram 1990   NORMAL  . Hyperlipidemia   . PAC (premature atrial contraction)    . Pre-diabetes      Past Surgical History:  Procedure Laterality Date  . BASAL CELL CARCINOMA EXCISION     19 MO OLD  . CARDIAC CATHETERIZATION    . colonscopy    . CORONARY ARTERY BYPASS GRAFT N/A 04/09/2017   Procedure: CORONARY ARTERY BYPASS GRAFTING (CABG)x5 USING RIGHT SAPHENOUS VEIN, HARVESTED ENDOSCOPICALLY AND LEFT INTERNAL MAMMARY ARTERY;  Surgeon: Melrose Nakayama, MD;  Location: [place];  Service: Open Heart Surgery;  Laterality: N/A;  . DRUG INDUCED ENDOSCOPY    . LEFT HEART CATH AND CORONARY ANGIOGRAPHY N/A 04/01/2017   Procedure: LEFT HEART CATH AND CORONARY ANGIOGRAPHY;  Surgeon: Nigel Mormon, MD;  Location: [place] [facility];  Service: Cardiovascular;  Laterality: N/A;  . TEE WITHOUT CARDIOVERSION N/A 04/09/2017   Procedure: TRANSESOPHAGEAL ECHOCARDIOGRAM (TEE);  Surgeon: Melrose Nakayama, MD;  Location: [place];  Service: Open Heart Surgery;  Laterality: N/A;     Social History   Socioeconomic History  . Marital status: Married    Spouse name: Eddie Dibbles  . Number of children: 2  . Years of education: Not on file  . Highest education level: Not on file  Occupational History  . Not on file  Social Needs  . Financial resource strain: Not on file  . Food insecurity    Worry: Not on file    Inability: Not on file  . Transportation needs    Medical: Not on file    Non-medical:  Not on file  Tobacco Use  . Smoking status: Never Smoker  . Smokeless tobacco: Never Used  Substance and Sexual Activity  . Alcohol use: No    Frequency: Never  . Drug use: No  . Sexual activity: Not on file  Lifestyle  . Physical activity    Days per week: 4 days    Minutes per session: 30 min  . Stress: To some extent  Relationships  . Social Herbalist on phone: Not on file    Gets together: Not on file    Attends religious service: Not on file    Active member of club or organization: Not on file    Attends meetings of clubs or organizations: Not on file     Relationship status: Not on file  . Intimate partner violence    Fear of current or ex partner: Not on file    Emotionally abused: Not on file    Physically abused: Not on file    Forced sexual activity: Not on file  Other Topics Concern  . Not on file  Social History Narrative  . Not on file     Family History  Problem Relation Age of Onset  . Cancer Mother        LUNG  . Cancer Father        COLON, MELANOMA  . Heart disease Father 30       MI   . Stroke Father   . Heart disease Brother 63       MI     Current Outpatient Medications on File Prior to Visit  Medication Sig Dispense Refill  . aspirin EC 81 MG tablet Take 81 mg by mouth daily.     Marland Kitchen atorvastatin (LIPITOR) 20 MG tablet Take 20 mg by mouth daily.    . metoprolol succinate (TOPROL-XL) 25 MG 24 hr tablet Take 1 tablet (25 mg total) by mouth 2 (two) times daily. 60 tablet 3  . Multiple Vitamins-Minerals (MULTIVITAMIN WITH MINERALS) tablet Take 1 tablet by mouth daily.    . nitroGLYCERIN (NITROSTAT) 0.4 MG SL tablet Place 1 tablet (0.4 mg total) under the tongue every 5 (five) minutes as needed for chest pain. 90 tablet 3  . pantoprazole (PROTONIX) 40 MG tablet Take 40 mg by mouth daily.      No current facility-administered medications on file prior to visit.     Cardiovascular studies:  EKG 01/16/2019: Sinus rhythm 60 bpm.  Left axis deviation. Poor R wave progression.   Exercise myoview stress 01/28/2018:  1. The patient performed treadmill exercise using Bruce protocol, completing 6:13 minutes. The patient completed an estimated workload of 7.3 METS, reaching of the maximum predicted heart rate. Low exercise capacity for age. Normal hemodynamic response. Patient developed exercise induced chest pain that relived with rest. The stress electrocardiogram showed sinus tachycardia, normal stress conduction and no stress arrhythmias. Stress EKG sowed isolated T wave inversion in lead aVL that persisted 4 min  into recovery. Stress EKG is equivocal for ischemia.  2. The overall quality of the study is good.  Left ventricular cavity is noted to be normal on the rest and stress studies.  Gated SPECT images reveal normal myocardial thickening and wall motion.  The left ventricular ejection fraction was calculated or visually estimated to be 68% Small area of basal inferoseptal perfusion defect with no significant reversibility, likely due to breast attenuation artifact, with imaging performed in sitting position. .   3. Low  risk SPECT imaging study. Recommend clinical correlation given patient's exercise induce chest pain and localized EKG changes, as described.  Lower extremity arterial duplex 06/02/2017: No hemodynamically significant stenoses are identified in the right lower extremity arterial system. No hemodynamically significant stenoses are identified in the left lower extremity arterial system. This exam reveals normal perfusion of the right lower extremity RABI 0.98 and mildly decreased perfusion of the left lower extremity, noted at the post tibial artery level ABI 0.94. However there was triphasic (normal waveform) noted at the level of the ankle.  Clinical correlation recommended.  Recent labs: 02/16/2019: Cholesterol 216, triglycerides 206, HDL 57, LDL 123.  03/15/2017:  Cholesterol 301, triglycerides 585, HDL 42, LDL 150.  05/05/2017: H/H 11.5/37.1.  MCV 27.  Triglycerides 436.  Review of Systems  Constitution: Negative for decreased appetite, malaise/fatigue, weight gain and weight loss.  HENT: Negative for congestion.   Eyes: Negative for visual disturbance.  Cardiovascular: Positive for chest pain. Negative for dyspnea on exertion, leg swelling, palpitations and syncope.  Respiratory: Negative for cough.   Endocrine: Negative for cold intolerance.  Hematologic/Lymphatic: Does not bruise/bleed easily.  Skin: Negative for itching and rash.  Musculoskeletal: Negative for myalgias.   Gastrointestinal: Negative for abdominal pain, nausea and vomiting.  Genitourinary: Negative for dysuria.  Neurological: Negative for dizziness and weakness.  Psychiatric/Behavioral: The patient is not nervous/anxious.   All other systems reviewed and are negative.       Vitals:   02/20/19 1441  BP: 119/63  Pulse: 65      Objective:   Physical exam not performed. Telephone visit.        Assessment & Recommendations:   68 year old Caucasian female with prediabetes, history of basal cell carcinoma, severe multivessel coronary artery disease status post CABG 5 on 04/09/2017, hypertriglyceridemia.   CAD: Continues to have atypical angina symptoms. Her stress test has always been under whelming, probably owing to her low LV cavity. I discussed further optimizing medical therapy vs coronary and bypass graft angiography. Patient would prefer medical therapy. Continue Aspirin, statin, metoprolol. Started Imdur 30 mg daily.   Hyperlipidemia:  Improved, but LDL still suboptimal. Increase lipitor to 40 mg daily.   F/u in 4 weeks.   Nigel Mormon, MD [facility] Pager: [phone] Office: [phone] If no answer Cell [phone]

## 2019-02-17 DIAGNOSIS — R1310 Dysphagia, unspecified: Secondary | ICD-10-CM | POA: Diagnosis not present

## 2019-02-17 DIAGNOSIS — K219 Gastro-esophageal reflux disease without esophagitis: Secondary | ICD-10-CM | POA: Diagnosis not present

## 2019-02-22 ENCOUNTER — Encounter: Payer: Self-pay | Admitting: Cardiology

## 2019-02-23 ENCOUNTER — Telehealth (INDEPENDENT_AMBULATORY_CARE_PROVIDER_SITE_OTHER): Payer: Medicare HMO | Admitting: Cardiology

## 2019-02-23 ENCOUNTER — Other Ambulatory Visit: Payer: Self-pay

## 2019-02-23 VITALS — BP 119/63 | HR 65

## 2019-02-23 DIAGNOSIS — I25810 Atherosclerosis of coronary artery bypass graft(s) without angina pectoris: Secondary | ICD-10-CM | POA: Insufficient documentation

## 2019-02-23 DIAGNOSIS — I25708 Atherosclerosis of coronary artery bypass graft(s), unspecified, with other forms of angina pectoris: Secondary | ICD-10-CM

## 2019-02-23 DIAGNOSIS — E782 Mixed hyperlipidemia: Secondary | ICD-10-CM

## 2019-02-23 MED ORDER — ISOSORBIDE MONONITRATE ER 30 MG PO TB24: 30.0000 mg | ORAL_TABLET | Freq: Every day | ORAL | 2 refills | Status: DC

## 2019-02-23 MED ORDER — ATORVASTATIN CALCIUM 40 MG PO TABS: 40.0000 mg | ORAL_TABLET | Freq: Every day | ORAL | 1 refills | Status: DC

## 2019-03-02 ENCOUNTER — Telehealth: Payer: Self-pay

## 2019-03-02 NOTE — Telephone Encounter (Signed)
Sounds like she may not be able to tolerate it. We may have to discuss alternate options. Please ask her if she would like to see sooner than 10/22. If so, please let front desk know to schedule an appt.  Thanks MJP

## 2019-03-02 NOTE — Telephone Encounter (Signed)
Telephone encounter:  Reason for call: Pt called and stated that the new medication that you prescribe (Imdur) has been given her headaches. Pt would like to know is there any other medication that you would be able to give her. Please advice thank you  Usual provider: Deland Pretty  Last office visit: 02/23/2019  Next office visit: 03/23/2019   Last hospitalization: 04/09/2017   Current Outpatient Medications on File Prior to Visit  Medication Sig Dispense Refill  . aspirin EC 81 MG tablet Take 81 mg by mouth daily.     Marland Kitchen atorvastatin (LIPITOR) 40 MG tablet Take 1 tablet (40 mg total) by mouth daily. 90 tablet 1  . isosorbide mononitrate (IMDUR) 30 MG 24 hr tablet Take 1 tablet (30 mg total) by mouth daily. 30 tablet 2  . metoprolol succinate (TOPROL-XL) 25 MG 24 hr tablet Take 1 tablet (25 mg total) by mouth 2 (two) times daily. 60 tablet 3  . Multiple Vitamins-Minerals (MULTIVITAMIN WITH MINERALS) tablet Take 1 tablet by mouth daily.    . nitroGLYCERIN (NITROSTAT) 0.4 MG SL tablet Place 1 tablet (0.4 mg total) under the tongue every 5 (five) minutes as needed for chest pain. (Patient not taking: Reported on 02/23/2019) 90 tablet 3  . pantoprazole (PROTONIX) 40 MG tablet Take 40 mg by mouth daily.      No current facility-administered medications on file prior to visit.

## 2019-03-02 NOTE — Telephone Encounter (Signed)
Try tylenol daily for next 2-3 days. Try 1/2 pill Imdur and see if she can tolerate it.  Thanks MJP

## 2019-03-02 NOTE — Telephone Encounter (Signed)
LMOVM informing her of Dr.Patwardhan's instructions.

## 2019-03-02 NOTE — Telephone Encounter (Signed)
Patient said that pain in her head is happened also when taking 1/2 imdur . Tylenol has helped some but she is still in a lot of pain. The pain gets worse in the morning then around lunch and again in the evening.

## 2019-03-07 ENCOUNTER — Telehealth: Payer: Self-pay

## 2019-03-07 NOTE — Telephone Encounter (Signed)
LVM for pt to call office back.

## 2019-03-07 NOTE — Telephone Encounter (Signed)
Please ask her if we could move the appt sooner to discuss alternate options. This can be a telephone encounter tomorrow on 10/7. Please check with front desk.  Thanks MJP

## 2019-03-07 NOTE — Telephone Encounter (Signed)
Telephone encounter:  Reason for call: pt called to say she stopped taking isosorbide and wanted an alternative.   Usual provider: MP  Last office visit: 02/23/19 Virtual  Next office visit: 03/23/19   Last hospitalization:    Current Outpatient Medications on File Prior to Visit  Medication Sig Dispense Refill  . aspirin EC 81 MG tablet Take 81 mg by mouth daily.     Marland Kitchen atorvastatin (LIPITOR) 40 MG tablet Take 1 tablet (40 mg total) by mouth daily. 90 tablet 1  . isosorbide mononitrate (IMDUR) 30 MG 24 hr tablet Take 1 tablet (30 mg total) by mouth daily. 30 tablet 2  . metoprolol succinate (TOPROL-XL) 25 MG 24 hr tablet Take 1 tablet (25 mg total) by mouth 2 (two) times daily. 60 tablet 3  . Multiple Vitamins-Minerals (MULTIVITAMIN WITH MINERALS) tablet Take 1 tablet by mouth daily.    . nitroGLYCERIN (NITROSTAT) 0.4 MG SL tablet Place 1 tablet (0.4 mg total) under the tongue every 5 (five) minutes as needed for chest pain. (Patient not taking: Reported on 02/23/2019) 90 tablet 3  . pantoprazole (PROTONIX) 40 MG tablet Take 40 mg by mouth daily.      No current facility-administered medications on file prior to visit.

## 2019-03-22 DIAGNOSIS — N3281 Overactive bladder: Secondary | ICD-10-CM | POA: Diagnosis not present

## 2019-03-22 DIAGNOSIS — R311 Benign essential microscopic hematuria: Secondary | ICD-10-CM | POA: Diagnosis not present

## 2019-03-22 DIAGNOSIS — R32 Unspecified urinary incontinence: Secondary | ICD-10-CM | POA: Diagnosis not present

## 2019-03-23 ENCOUNTER — Ambulatory Visit: Payer: Medicare HMO | Admitting: Cardiology

## 2019-04-13 ENCOUNTER — Other Ambulatory Visit: Payer: Self-pay

## 2019-04-13 ENCOUNTER — Ambulatory Visit: Payer: Medicare HMO | Admitting: Cardiology

## 2019-04-13 VITALS — BP 131/70 | HR 66 | Temp 97.6°F | Ht 62.0 in | Wt 135.0 lb

## 2019-04-13 DIAGNOSIS — I25708 Atherosclerosis of coronary artery bypass graft(s), unspecified, with other forms of angina pectoris: Secondary | ICD-10-CM | POA: Diagnosis not present

## 2019-04-13 DIAGNOSIS — E782 Mixed hyperlipidemia: Secondary | ICD-10-CM | POA: Diagnosis not present

## 2019-04-13 MED ORDER — RANOLAZINE ER 500 MG PO TB12: 500.0000 mg | ORAL_TABLET | Freq: Two times a day (BID) | ORAL | 2 refills | Status: DC

## 2019-04-13 NOTE — Progress Notes (Signed)
Follow up visit  Subjective:   Christine [name], female    DOB: 03-19-1951, 68 y.o.   MRN: [id number]   Chief Complaint  Patient presents with  . Chest Pain    HPI  69 year old Caucasian female with prediabetes, hyperlipidemia, history of basal cell carcinoma, severe multivessel coronary artery disease status post CABG 5 on 04/09/2017.  Patient initially experienced exertional chest pain, and throat pain in follow-up 2018.  Although exercise stress test did not show any acute ischemia, clinical suspicion for obstructive CAD was high, therefore, she underwent coronary angiography which showed severe multivessel CAD.  She underwent CABG X5 (LIMA - LAD, SVG-DIAG 1A - DIAG 1B, SVG-OM1 - OM2) by Dr. Roxan Hockey on 04/09/2017.  While she had significant improvement in her symptoms after CABG, she had recurrence of similar symptoms in summer 2019.  She underwent exercise nuclear stress test in August 2019.  Again, she did have exercise-induced chest pain and EKG changes, but did not have any evidence of myocardial ischemia.  My suspicion was that given her small LV cavity, and possibility of balanced ischemia stress test is likely underestimating the degree of ischemia, as it did in fall 2018.  We had discussed medical therapy versus repeat coronary angiography at that time.  Given initial improvement in symptoms with medical therapy, patient wanted to hold off coronary angiography.  Unfortunately, patient has continued to have chest pain, now both on exertion and at rest.  She has not tolerated isosorbide mononitrate.  I am unable to uptitrate her beta-blocker due to low normal blood pressure.  Past Medical History:  Diagnosis Date  . Angina pectoris ([place])   . Arthritis   . CAD, multiple vessel   . Cancer (HCC)    18 MONTHS  BASAL CELL   . Chest pain ON EXERTION   . Dyspnea    W/ EXERTION   . GERD (gastroesophageal reflux disease)   . H/O echocardiogram 1990   NORMAL  . Hyperlipidemia    . PAC (premature atrial contraction)   . Pre-diabetes      Past Surgical History:  Procedure Laterality Date  . BASAL CELL CARCINOMA EXCISION     19 MO OLD  . CARDIAC CATHETERIZATION    . colonscopy    . CORONARY ARTERY BYPASS GRAFT N/A 04/09/2017   Procedure: CORONARY ARTERY BYPASS GRAFTING (CABG)x5 USING RIGHT SAPHENOUS VEIN, HARVESTED ENDOSCOPICALLY AND LEFT INTERNAL MAMMARY ARTERY;  Surgeon: Melrose Nakayama, MD;  Location: [place];  Service: Open Heart Surgery;  Laterality: N/A;  . DRUG INDUCED ENDOSCOPY    . LEFT HEART CATH AND CORONARY ANGIOGRAPHY N/A 04/01/2017   Procedure: LEFT HEART CATH AND CORONARY ANGIOGRAPHY;  Surgeon: Nigel Mormon, MD;  Location: [place] [facility];  Service: Cardiovascular;  Laterality: N/A;  . TEE WITHOUT CARDIOVERSION N/A 04/09/2017   Procedure: TRANSESOPHAGEAL ECHOCARDIOGRAM (TEE);  Surgeon: Melrose Nakayama, MD;  Location: [place];  Service: Open Heart Surgery;  Laterality: N/A;     Social History   Socioeconomic History  . Marital status: Married    Spouse name: Eddie Dibbles  . Number of children: 2  . Years of education: Not on file  . Highest education level: Not on file  Occupational History  . Not on file  Social Needs  . Financial resource strain: Not on file  . Food insecurity    Worry: Not on file    Inability: Not on file  . Transportation needs    Medical: Not on file  Non-medical: Not on file  Tobacco Use  . Smoking status: Never Smoker  . Smokeless tobacco: Never Used  Substance and Sexual Activity  . Alcohol use: No    Frequency: Never  . Drug use: No  . Sexual activity: Not on file  Lifestyle  . Physical activity    Days per week: 4 days    Minutes per session: 30 min  . Stress: To some extent  Relationships  . Social Herbalist on phone: Not on file    Gets together: Not on file    Attends religious service: Not on file    Active member of club or organization: Not on file    Attends meetings  of clubs or organizations: Not on file    Relationship status: Not on file  . Intimate partner violence    Fear of current or ex partner: Not on file    Emotionally abused: Not on file    Physically abused: Not on file    Forced sexual activity: Not on file  Other Topics Concern  . Not on file  Social History Narrative  . Not on file     Family History  Problem Relation Age of Onset  . Cancer Mother        LUNG  . Cancer Father        COLON, MELANOMA  . Heart disease Father 81       MI   . Stroke Father   . Heart disease Brother 82       MI  . Cancer Brother      Current Outpatient Medications on File Prior to Visit  Medication Sig Dispense Refill  . aspirin EC 81 MG tablet Take 81 mg by mouth daily.     Marland Kitchen atorvastatin (LIPITOR) 40 MG tablet Take 1 tablet (40 mg total) by mouth daily. 90 tablet 1  . metoprolol succinate (TOPROL-XL) 25 MG 24 hr tablet Take 1 tablet (25 mg total) by mouth 2 (two) times daily. 60 tablet 3  . Multiple Vitamins-Minerals (MULTIVITAMIN WITH MINERALS) tablet Take 1 tablet by mouth daily.    . nitroGLYCERIN (NITROSTAT) 0.4 MG SL tablet Place 1 tablet (0.4 mg total) under the tongue every 5 (five) minutes as needed for chest pain. 90 tablet 3  . pantoprazole (PROTONIX) 40 MG tablet Take 40 mg by mouth daily.      No current facility-administered medications on file prior to visit.     Cardiovascular studies:  EKG 04/13/2019: Probable sinus rhythm 66 bpm.  Left axis deviation.  Exercise myoview stress 01/28/2018:  1. The patient performed treadmill exercise using Bruce protocol, completing 6:13 minutes. The patient completed an estimated workload of 7.3 METS, reaching of the maximum predicted heart rate. Low exercise capacity for age. Normal hemodynamic response. Patient developed exercise induced chest pain that relived with rest. The stress electrocardiogram showed sinus tachycardia, normal stress conduction and no stress arrhythmias. Stress  EKG sowed isolated T wave inversion in lead aVL that persisted 4 min into recovery. Stress EKG is equivocal for ischemia.  2. The overall quality of the study is good.  Left ventricular cavity is noted to be normal on the rest and stress studies.  Gated SPECT images reveal normal myocardial thickening and wall motion.  The left ventricular ejection fraction was calculated or visually estimated to be 68% Small area of basal inferoseptal perfusion defect with no significant reversibility, likely due to breast attenuation artifact, with imaging performed in sitting  position. .   3. Low risk SPECT imaging study. Recommend clinical correlation given patient's exercise induce chest pain and localized EKG changes, as described.  Lower extremity arterial duplex 06/02/2017: No hemodynamically significant stenoses are identified in the right lower extremity arterial system. No hemodynamically significant stenoses are identified in the left lower extremity arterial system. This exam reveals normal perfusion of the right lower extremity RABI 0.98 and mildly decreased perfusion of the left lower extremity, noted at the post tibial artery level ABI 0.94. However there was triphasic (normal waveform) noted at the level of the ankle.  Clinical correlation recommended.  CABG 04/09/2017 (Dr. Roxan Hockey): LIMA - LAD SVG-DIAG 1A - DIAG 1B SVG-OM1 - OM2  Coronary angiography 04/01/2017: LM: Distal 40% stenosis LAD: Ostial to mid 99-70% stenosis.  Diagonal 1 with mid 70% stenosis. LCx: Large, dominant vessel.  Ostial 80%, mid 60% stenosis.  OM 2 with proximal 60% stenosis. RCA: Nondominant vessel.  Mid diffuse 80% stenosis.  Recent labs: Results for CHANDLAR, ECKELBERRY (MRN [id number]) as of 04/13/2019 12:54  Ref. Range 02/15/2019 08:22  Total CHOL/HDL Ratio Latest Ref Range: 0.0 - 4.4 ratio 3.8  Cholesterol, Total Latest Ref Range: 100 - 199 mg/dL 216 (H)  HDL Cholesterol Latest Ref Range: >39 mg/dL 57  Triglycerides  Latest Ref Range: 0 - 149 mg/dL 206 (H)  VLDL Cholesterol Cal Latest Ref Range: 5 - 40 mg/dL 36  LDL Chol Calc (NIH) Latest Ref Range: 0 - 99 mg/dL 123 (H)    03/15/2017:  Cholesterol 301, triglycerides 585, HDL 42, LDL 150.  05/05/2017: H/H 11.5/37.1.  MCV 27.  Triglycerides 436.  Review of Systems  Constitution: Negative for decreased appetite, malaise/fatigue, weight gain and weight loss.  HENT: Negative for congestion.   Eyes: Negative for visual disturbance.  Cardiovascular: Positive for chest pain. Negative for dyspnea on exertion, leg swelling, palpitations and syncope.  Respiratory: Negative for cough.   Endocrine: Negative for cold intolerance.  Hematologic/Lymphatic: Does not bruise/bleed easily.  Skin: Negative for itching and rash.  Musculoskeletal: Negative for myalgias.  Gastrointestinal: Negative for abdominal pain, nausea and vomiting.  Genitourinary: Negative for dysuria.  Neurological: Negative for dizziness and weakness.  Psychiatric/Behavioral: The patient is not nervous/anxious.   All other systems reviewed and are negative.       Vitals:   04/13/19 1345  BP: 131/70  Pulse: 66  Temp: 97.6 F (36.4 C)  SpO2: 96%     Body mass index is 24.69 kg/m. Filed Weights   04/13/19 1345  Weight: 135 lb (61.2 kg)     Objective:   Physical Exam  Constitutional: She is oriented to person, place, and time. She appears well-developed and well-nourished. No distress.  HENT:  Head: Normocephalic and atraumatic.  Eyes: Pupils are equal, round, and reactive to light. Conjunctivae are normal.  Neck: No JVD present.  Cardiovascular: Normal rate, regular rhythm and intact distal pulses.  Pulmonary/Chest: Effort normal and breath sounds normal. She has no wheezes. She has no rales.  Sternotomy scar  Abdominal: Soft. Bowel sounds are normal. There is no rebound.  Musculoskeletal:        General: No edema.  Lymphadenopathy:    She has no cervical adenopathy.   Neurological: She is alert and oriented to person, place, and time. No cranial nerve deficit.  Skin: Skin is warm and dry.  Psychiatric: She has a normal mood and affect.  Nursing note and vitals reviewed.         Assessment &  Recommendations:   68 year old Caucasian female with prediabetes, hyperlipidemia, history of basal cell carcinoma, severe multivessel coronary artery disease status post CABG 5 on 04/09/2017.  Chest pain: Recurrent chest pain, now also present at rest, concerning for unstable angina.  Given lack of definitive diagnosis with noninvasive imaging, next best option would be to proceed with coronary and bypass graft angiography for definitive diagnosis and possibly invasive management.  Medical management is limited due to her inability to tolerate Imdur due to severe headache. Continue aspirin, Lipitor 20 mg daily, metoprolol succinate 25 mg daily.  Added Ranexa 500 mg twice daily.  Hyperlipidemia:  Now improved.  LDL remains suboptimal at 123.  She is currently on Lipitor 40 mg daily.  Ideally, would like to add Zetia or Repatha.  Patient wants to hold off until after coronary angiography.  Follow-up after coronary/bypass graft angiography.  Nigel Mormon, MD [facility] [place] Pager: [phone] Office: [phone] If no answer Cell [phone]

## 2019-04-14 ENCOUNTER — Encounter: Payer: Self-pay | Admitting: Cardiology

## 2019-04-21 ENCOUNTER — Other Ambulatory Visit (HOSPITAL_COMMUNITY): Payer: Self-pay | Admitting: Cardiology

## 2019-04-21 ENCOUNTER — Other Ambulatory Visit (HOSPITAL_COMMUNITY): Payer: Self-pay

## 2019-04-21 ENCOUNTER — Other Ambulatory Visit (HOSPITAL_COMMUNITY)
Admission: RE | Admit: 2019-04-21 | Discharge: 2019-04-21 | Disposition: A | Discharge status: Home / Self Care | Payer: Medicare HMO | Source: Ambulatory Visit | Attending: Cardiology | Admitting: Cardiology

## 2019-04-21 DIAGNOSIS — I25708 Atherosclerosis of coronary artery bypass graft(s), unspecified, with other forms of angina pectoris: Secondary | ICD-10-CM | POA: Diagnosis not present

## 2019-04-21 DIAGNOSIS — Z01812 Encounter for preprocedural laboratory examination: Secondary | ICD-10-CM | POA: Diagnosis not present

## 2019-04-21 DIAGNOSIS — Z20828 Contact with and (suspected) exposure to other viral communicable diseases: Secondary | ICD-10-CM | POA: Insufficient documentation

## 2019-04-22 LAB — BASIC METABOLIC PANEL
BUN/Creatinine Ratio: 18 (ref 12–28)
BUN: 14 mg/dL (ref 8–27)
CO2: 20 mmol/L (ref 20–29)
Calcium: 9.3 mg/dL (ref 8.7–10.3)
Chloride: 98 mmol/L (ref 96–106)
Creatinine, Ser: 0.8 mg/dL (ref 0.57–1.00)
GFR calc Af Amer: 88 mL/min/{1.73_m2} (ref >59)
GFR calc non Af Amer: 77 mL/min/{1.73_m2} (ref >59)
Glucose: 314 mg/dL — ABNORMAL HIGH (ref 65–99)
Potassium: 4.3 mmol/L (ref 3.5–5.2)
Sodium: 135 mmol/L (ref 134–144)

## 2019-04-22 LAB — CBC
Hematocrit: 39.3 % (ref 34.0–46.6)
Hemoglobin: 13 g/dL (ref 11.1–15.9)
MCH: 27.7 pg (ref 26.6–33.0)
MCHC: 33.1 g/dL (ref 31.5–35.7)
MCV: 84 fL (ref 79–97)
Platelets: 292 10*3/uL (ref 150–450)
RBC: 4.7 x10E6/uL (ref 3.77–5.28)
RDW: 11.9 % (ref 11.7–15.4)
WBC: 7.7 10*3/uL (ref 3.4–10.8)

## 2019-04-23 LAB — NOVEL CORONAVIRUS, NAA (HOSP ORDER, SEND-OUT TO REF LAB; TAT 18-24 HRS): SARS-CoV-2, NAA: NOT DETECTED

## 2019-04-25 ENCOUNTER — Other Ambulatory Visit: Payer: Self-pay

## 2019-04-25 ENCOUNTER — Ambulatory Visit (HOSPITAL_COMMUNITY)
Admission: RE | Admit: 2019-04-25 | Discharge: 2019-04-25 | Disposition: A | Discharge status: Home / Self Care | Payer: Medicare HMO | Attending: Cardiology | Admitting: Cardiology

## 2019-04-25 ENCOUNTER — Encounter (HOSPITAL_COMMUNITY)
Admission: RE | Disposition: A | Discharge status: Home / Self Care | Payer: Self-pay | Source: Home / Self Care | Attending: Cardiology

## 2019-04-25 ENCOUNTER — Encounter (HOSPITAL_COMMUNITY): Payer: Self-pay | Admitting: Cardiology

## 2019-04-25 DIAGNOSIS — R7303 Prediabetes: Secondary | ICD-10-CM | POA: Insufficient documentation

## 2019-04-25 DIAGNOSIS — K219 Gastro-esophageal reflux disease without esophagitis: Secondary | ICD-10-CM | POA: Diagnosis not present

## 2019-04-25 DIAGNOSIS — M1990 Unspecified osteoarthritis, unspecified site: Secondary | ICD-10-CM | POA: Diagnosis not present

## 2019-04-25 DIAGNOSIS — I25110 Atherosclerotic heart disease of native coronary artery with unstable angina pectoris: Secondary | ICD-10-CM | POA: Insufficient documentation

## 2019-04-25 DIAGNOSIS — Z79899 Other long term (current) drug therapy: Secondary | ICD-10-CM | POA: Diagnosis not present

## 2019-04-25 DIAGNOSIS — I200 Unstable angina: Secondary | ICD-10-CM

## 2019-04-25 DIAGNOSIS — Z7982 Long term (current) use of aspirin: Secondary | ICD-10-CM | POA: Diagnosis not present

## 2019-04-25 DIAGNOSIS — Z951 Presence of aortocoronary bypass graft: Secondary | ICD-10-CM | POA: Diagnosis not present

## 2019-04-25 DIAGNOSIS — Z8249 Family history of ischemic heart disease and other diseases of the circulatory system: Secondary | ICD-10-CM | POA: Insufficient documentation

## 2019-04-25 DIAGNOSIS — I2584 Coronary atherosclerosis due to calcified coronary lesion: Secondary | ICD-10-CM | POA: Diagnosis not present

## 2019-04-25 DIAGNOSIS — E785 Hyperlipidemia, unspecified: Secondary | ICD-10-CM | POA: Diagnosis not present

## 2019-04-25 HISTORY — PX: LEFT HEART CATH AND CORS/GRAFTS ANGIOGRAPHY: CATH118250

## 2019-04-25 SURGERY — LEFT HEART CATH AND CORS/GRAFTS ANGIOGRAPHY
Anesthesia: LOCAL

## 2019-04-25 MED ORDER — VERAPAMIL HCL 2.5 MG/ML IV SOLN
INTRAVENOUS | Status: DC | PRN
  Administered 2019-04-25: 10 mL via INTRA_ARTERIAL

## 2019-04-25 MED ORDER — ACETAMINOPHEN 325 MG PO TABS: 650.0000 mg | ORAL_TABLET | ORAL | Status: DC | PRN

## 2019-04-25 MED ORDER — HEPARIN SODIUM (PORCINE) 1000 UNIT/ML IJ SOLN
INTRAMUSCULAR | Status: DC | PRN
  Administered 2019-04-25: 3000 [IU] via INTRAVENOUS

## 2019-04-25 MED ORDER — SODIUM CHLORIDE 0.9% FLUSH: 3.0000 mL | INTRAVENOUS | Status: DC | PRN

## 2019-04-25 MED ORDER — HEPARIN (PORCINE) IN NACL 1000-0.9 UT/500ML-% IV SOLN
INTRAVENOUS | Status: AC
  Filled 2019-04-25: qty 500

## 2019-04-25 MED ORDER — HEPARIN SODIUM (PORCINE) 1000 UNIT/ML IJ SOLN
INTRAMUSCULAR | Status: AC
  Filled 2019-04-25: qty 1

## 2019-04-25 MED ORDER — LIDOCAINE HCL (PF) 1 % IJ SOLN
INTRAMUSCULAR | Status: DC | PRN
  Administered 2019-04-25: 3 mL

## 2019-04-25 MED ORDER — VERAPAMIL HCL 2.5 MG/ML IV SOLN
INTRAVENOUS | Status: AC
  Filled 2019-04-25: qty 2

## 2019-04-25 MED ORDER — MIDAZOLAM HCL 2 MG/2ML IJ SOLN
INTRAMUSCULAR | Status: DC | PRN
  Administered 2019-04-25: 1 mg via INTRAVENOUS

## 2019-04-25 MED ORDER — ASPIRIN 81 MG PO CHEW
81.0000 mg | CHEWABLE_TABLET | ORAL | Status: AC
  Administered 2019-04-25: 81 mg via ORAL
  Filled 2019-04-25: qty 1

## 2019-04-25 MED ORDER — SODIUM CHLORIDE 0.9 % WEIGHT BASED INFUSION: 1.0000 mL/kg/h | INTRAVENOUS | Status: DC

## 2019-04-25 MED ORDER — SODIUM CHLORIDE 0.9 % WEIGHT BASED INFUSION
3.0000 mL/kg/h | INTRAVENOUS | Status: AC
  Administered 2019-04-25: 3 mL/kg/h via INTRAVENOUS

## 2019-04-25 MED ORDER — SODIUM CHLORIDE 0.9% FLUSH: 3.0000 mL | Freq: Two times a day (BID) | INTRAVENOUS | Status: DC

## 2019-04-25 MED ORDER — NITROGLYCERIN 1 MG/10 ML FOR IR/CATH LAB
INTRA_ARTERIAL | Status: AC
  Filled 2019-04-25: qty 10

## 2019-04-25 MED ORDER — SODIUM CHLORIDE 0.9 % IV SOLN: INTRAVENOUS | Status: DC

## 2019-04-25 MED ORDER — FENTANYL CITRATE (PF) 100 MCG/2ML IJ SOLN
INTRAMUSCULAR | Status: AC
  Filled 2019-04-25: qty 2

## 2019-04-25 MED ORDER — HYDRALAZINE HCL 20 MG/ML IJ SOLN: 10.0000 mg | INTRAMUSCULAR | Status: DC | PRN

## 2019-04-25 MED ORDER — SODIUM CHLORIDE 0.9 % IV SOLN: 250.0000 mL | INTRAVENOUS | Status: DC | PRN

## 2019-04-25 MED ORDER — IOHEXOL 350 MG/ML SOLN
INTRAVENOUS | Status: DC | PRN
  Administered 2019-04-25: 80 mL

## 2019-04-25 MED ORDER — ONDANSETRON HCL 4 MG/2ML IJ SOLN: 4.0000 mg | Freq: Four times a day (QID) | INTRAMUSCULAR | Status: DC | PRN

## 2019-04-25 MED ORDER — LIDOCAINE HCL (PF) 1 % IJ SOLN
INTRAMUSCULAR | Status: AC
  Filled 2019-04-25: qty 30

## 2019-04-25 MED ORDER — HEPARIN (PORCINE) IN NACL 1000-0.9 UT/500ML-% IV SOLN
INTRAVENOUS | Status: DC | PRN
  Administered 2019-04-25 (×2): 500 mL

## 2019-04-25 MED ORDER — LABETALOL HCL 5 MG/ML IV SOLN: 10.0000 mg | INTRAVENOUS | Status: DC | PRN

## 2019-04-25 MED ORDER — FENTANYL CITRATE (PF) 100 MCG/2ML IJ SOLN
INTRAMUSCULAR | Status: DC | PRN
  Administered 2019-04-25: 25 ug via INTRAVENOUS

## 2019-04-25 MED ORDER — MIDAZOLAM HCL 2 MG/2ML IJ SOLN
INTRAMUSCULAR | Status: AC
  Filled 2019-04-25: qty 2

## 2019-04-25 SURGICAL SUPPLY — 11 items
CATH DXT MULTI JL4 JR4 ANG PIG (CATHETERS) ×1 IMPLANT
CATH INFINITI 5 FR JL3.5 (CATHETERS) ×1 IMPLANT
DEVICE RAD COMP TR BAND LRG (VASCULAR PRODUCTS) ×1 IMPLANT
GLIDESHEATH SLEND A-KIT 6F 22G (SHEATH) ×1 IMPLANT
GUIDEWIRE INQWIRE 1.5J.035X260 (WIRE) IMPLANT
INQWIRE 1.5J .035X260CM (WIRE) ×2
KIT HEART LEFT (KITS) ×2 IMPLANT
PACK CARDIAC CATHETERIZATION (CUSTOM PROCEDURE TRAY) ×2 IMPLANT
TRANSDUCER W/STOPCOCK (MISCELLANEOUS) ×2 IMPLANT
TUBING CIL FLEX 10 FLL-RA (TUBING) ×2 IMPLANT
WIRE HI TORQ VERSACORE-J 145CM (WIRE) ×1 IMPLANT

## 2019-04-25 NOTE — Progress Notes (Addendum)
Discharge instructions reviewed with pt and her son (via telephone) . Both voice understanding.

## 2019-04-25 NOTE — Discharge Instructions (Signed)
Radial Site Care ° °This sheet gives you information about how to care for yourself after your procedure. Your health care provider may also give you more specific instructions. If you have problems or questions, contact your health care provider. °What can I expect after the procedure? °After the procedure, it is common to have: °· Bruising and tenderness at the catheter insertion area. °Follow these instructions at home: °Medicines °· Take over-the-counter and prescription medicines only as told by your health care provider. °Insertion site care °· Follow instructions from your health care provider about how to take care of your insertion site. Make sure you: °? Wash your hands with soap and water before you change your bandage (dressing). If soap and water are not available, use hand sanitizer. °? Change your dressing as told by your health care provider. °? Leave stitches (sutures), skin glue, or adhesive strips in place. These skin closures may need to stay in place for 2 weeks or longer. If adhesive strip edges start to loosen and curl up, you may trim the loose edges. Do not remove adhesive strips completely unless your health care provider tells you to do that. °· Check your insertion site every day for signs of infection. Check for: °? Redness, swelling, or pain. °? Fluid or blood. °? Pus or a bad smell. °? Warmth. °· Do not take baths, swim, or use a hot tub until your health care provider approves. °· You may shower 24-48 hours after the procedure, or as directed by your health care provider. °? Remove the dressing and gently wash the site with plain soap and water. °? Pat the area dry with a clean towel. °? Do not rub the site. That could cause bleeding. °· Do not apply powder or lotion to the site. °Activity ° °· For 24 hours after the procedure, or as directed by your health care provider: °? Do not flex or bend the affected arm. °? Do not push or pull heavy objects with the affected arm. °? Do not  drive yourself home from the hospital or clinic. You may drive 24 hours after the procedure unless your health care provider tells you not to. °? Do not operate machinery or power tools. °· Do not lift anything that is heavier than 10 lb (4.5 kg), or the limit that you are told, until your health care provider says that it is safe. °· Ask your health care provider when it is okay to: °? Return to work or school. °? Resume usual physical activities or sports. °? Resume sexual activity. °General instructions °· If the catheter site starts to bleed, raise your arm and put firm pressure on the site. If the bleeding does not stop, get help right away. This is a medical emergency. °· If you went home on the same day as your procedure, a responsible adult should be with you for the first 24 hours after you arrive home. °· Keep all follow-up visits as told by your health care provider. This is important. °Contact a health care provider if: °· You have a fever. °· You have redness, swelling, or yellow drainage around your insertion site. °Get help right away if: °· You have unusual pain at the radial site. °· The catheter insertion area swells very fast. °· The insertion area is bleeding, and the bleeding does not stop when you hold steady pressure on the area. °· Your arm or hand becomes pale, cool, tingly, or numb. °These symptoms may represent a serious problem   that is an emergency. Do not wait to see if the symptoms will go away. Get medical help right away. Call your local emergency services (911 in the [place] Do not drive yourself to the hospital. °Summary °· After the procedure, it is common to have bruising and tenderness at the site. °· Follow instructions from your health care provider about how to take care of your radial site wound. Check the wound every day for signs of infection. °· Do not lift anything that is heavier than 10 lb (4.5 kg), or the limit that you are told, until your health care provider says  that it is safe. °This information is not intended to replace advice given to you by your health care provider. Make sure you discuss any questions you have with your health care provider. °Document Released: 06/20/2010 Document Revised: 06/23/2017 Document Reviewed: 06/23/2017 °Elsevier Patient Education © 2020 [employer] ° °

## 2019-04-25 NOTE — Interval H&P Note (Signed)
History and Physical Interval Note:  04/25/2019 10:32 AM  Christine [name]  has presented today for surgery, with the diagnosis of Chest pain.  The various methods of treatment have been discussed with the patient and family. After consideration of risks, benefits and other options for treatment, the patient has consented to  Procedure(s): LEFT HEART CATH AND CORS/GRAFTS ANGIOGRAPHY (N/A) as a surgical intervention.  The patient's history has been reviewed, patient examined, no change in status, stable for surgery.  I have reviewed the patient's chart and labs.  Questions were answered to the patient's satisfaction.    2016/2017 Appropriate Use Criteria for Coronary Revascularization Clinical Presentation: Diabetes Mellitus? Symptom Status? S/P CABG? Antianginal Therapy (# of long-acting drugs)? Results of Non-invasive testing? FFR/iFR results in all diseased vessels? Patient undergoing renal transplant? Patient undergoing percutaneous valve procedure (TAVR, MitraClip, Others)? Symptom Status:  Ischemic Symptoms  Non-invasive Testing:  Intermediate Risk  If no or indeterminate stress test, FFR/iFR results in all diseased vessels:  N/A  Diabetes Mellitus:  Yes  S/P CABG:  Yes  Antianginal therapy (number of long-acting drugs):  >=2  Patient undergoing renal transplant:  No  Patient undergoing percutaneous valve procedure:  No     LIMA-LAD patent and without significant stenoses PCI CABG  Stenosis supplying 1 territory (bypass graft or native artery) other than anterior A (8); Indication 51 M (5); Indication 31   Stenoses supplying 2 territories (bypass graft or native artery, either 2 separate vessels or sequential graft supplying 2 territories) not including anterior territory A (8); Indication 54 M (6); Indication 34   newline LIMA-LAD not patent  Stenosis supplying 1 territory (bypass graft or native artery)-anterior (LAD) territory A (8); Indication 109 M (6); Indication 36    Stenoses supplying 2 territories (bypass graft or native artery, either 2 separate vessels or sequential graft supplying 2 territories)-LAD plus other territory A (8); Indication 39 A (8); Indication 39   Stenoses supplying 3 territories (bypass graft or native arteries, separate vessels, sequential grafts, or combination thereof)-LAD plus 2 other territories A (8); Indication 41 A (8); Indication [place]

## 2019-04-25 NOTE — H&P (Signed)
OV 04/13/2019 copied for documentation.     Follow up visit  Subjective:   Christine [name], female    DOB: 04/28/1951, 68 y.o.   MRN: [id number]      Chief Complaint  Patient presents with  . Chest Pain    HPI  68 year old Caucasian female with prediabetes, hyperlipidemia, history of basal cell carcinoma, severe multivessel coronary artery disease status post CABG 5 on 04/09/2017.  Patient initially experienced exertional chest pain, and throat pain in follow-up 2018.  Although exercise stress test did not show any acute ischemia, clinical suspicion for obstructive CAD was high, therefore, she underwent coronary angiography which showed severe multivessel CAD.  She underwent CABG X5 (LIMA - LAD, SVG-DIAG 1A - DIAG 1B, SVG-OM1 - OM2) by Dr. Roxan Hockey on 04/09/2017.  While she had significant improvement in her symptoms after CABG, she had recurrence of similar symptoms in summer 2019.  She underwent exercise nuclear stress test in August 2019.  Again, she did have exercise-induced chest pain and EKG changes, but did not have any evidence of myocardial ischemia.  My suspicion was that given her small LV cavity, and possibility of balanced ischemia stress test is likely underestimating the degree of ischemia, as it did in fall 2018.  We had discussed medical therapy versus repeat coronary angiography at that time.  Given initial improvement in symptoms with medical therapy, patient wanted to hold off coronary angiography.  Unfortunately, patient has continued to have chest pain, now both on exertion and at rest.  She has not tolerated isosorbide mononitrate.  I am unable to uptitrate her beta-blocker due to low normal blood pressure.      Past Medical History:  Diagnosis Date  . Angina pectoris ([place])   . Arthritis   . CAD, multiple vessel   . Cancer ([facility])    18 MONTHS  BASAL CELL   . Chest pain ON EXERTION   . Dyspnea    W/ EXERTION   . GERD (gastroesophageal  reflux disease)   . H/O echocardiogram 1990   NORMAL  . Hyperlipidemia   . PAC (premature atrial contraction)   . Pre-diabetes           Past Surgical History:  Procedure Laterality Date  . BASAL CELL CARCINOMA EXCISION     19 MO OLD  . CARDIAC CATHETERIZATION    . colonscopy    . CORONARY ARTERY BYPASS GRAFT N/A 04/09/2017   Procedure: CORONARY ARTERY BYPASS GRAFTING (CABG)x5 USING RIGHT SAPHENOUS VEIN, HARVESTED ENDOSCOPICALLY AND LEFT INTERNAL MAMMARY ARTERY;  Surgeon: Melrose Nakayama, MD;  Location: [place];  Service: Open Heart Surgery;  Laterality: N/A;  . DRUG INDUCED ENDOSCOPY    . LEFT HEART CATH AND CORONARY ANGIOGRAPHY N/A 04/01/2017   Procedure: LEFT HEART CATH AND CORONARY ANGIOGRAPHY;  Surgeon: Nigel Mormon, MD;  Location: [facility];  Service: Cardiovascular;  Laterality: N/A;  . TEE WITHOUT CARDIOVERSION N/A 04/09/2017   Procedure: TRANSESOPHAGEAL ECHOCARDIOGRAM (TEE);  Surgeon: Melrose Nakayama, MD;  Location: [facility];  Service: Open Heart Surgery;  Laterality: N/A;     Social History        Socioeconomic History  . Marital status: Married    Spouse name: Eddie Dibbles  . Number of children: 2  . Years of education: Not on file  . Highest education level: Not on file  Occupational History  . Not on file  Social Needs  . Financial resource strain: Not on file  . Food insecurity  Worry: Not on file    Inability: Not on file  . Transportation needs    Medical: Not on file    Non-medical: Not on file  Tobacco Use  . Smoking status: Never Smoker  . Smokeless tobacco: Never Used  Substance and Sexual Activity  . Alcohol use: No    Frequency: Never  . Drug use: No  . Sexual activity: Not on file  Lifestyle  . Physical activity    Days per week: 4 days    Minutes per session: 30 min  . Stress: To some extent  Relationships  . Social Herbalist on phone: Not on file    Gets  together: Not on file    Attends religious service: Not on file    Active member of club or organization: Not on file    Attends meetings of clubs or organizations: Not on file    Relationship status: Not on file  . Intimate partner violence    Fear of current or ex partner: Not on file    Emotionally abused: Not on file    Physically abused: Not on file    Forced sexual activity: Not on file  Other Topics Concern  . Not on file  Social History Narrative  . Not on file          Family History  Problem Relation Age of Onset  . Cancer Mother        LUNG  . Cancer Father        COLON, MELANOMA  . Heart disease Father 9       MI   . Stroke Father   . Heart disease Brother 10       MI  . Cancer Brother            Current Outpatient Medications on File Prior to Visit  Medication Sig Dispense Refill  . aspirin EC 81 MG tablet Take 81 mg by mouth daily.     Marland Kitchen atorvastatin (LIPITOR) 40 MG tablet Take 1 tablet (40 mg total) by mouth daily. 90 tablet 1  . metoprolol succinate (TOPROL-XL) 25 MG 24 hr tablet Take 1 tablet (25 mg total) by mouth 2 (two) times daily. 60 tablet 3  . Multiple Vitamins-Minerals (MULTIVITAMIN WITH MINERALS) tablet Take 1 tablet by mouth daily.    . nitroGLYCERIN (NITROSTAT) 0.4 MG SL tablet Place 1 tablet (0.4 mg total) under the tongue every 5 (five) minutes as needed for chest pain. 90 tablet 3  . pantoprazole (PROTONIX) 40 MG tablet Take 40 mg by mouth daily.      No current facility-administered medications on file prior to visit.     Cardiovascular studies:  EKG 04/13/2019: Probable sinus rhythm 66 bpm.  Left axis deviation.  Exercise myoview stress 01/28/2018:  1. The patient performed treadmill exercise using Bruce protocol, completing 6:13 minutes. The patient completed an estimated workload of 7.3 METS, reaching of the maximum predicted heart rate. Low exercise capacity for age. Normal hemodynamic  response. Patient developed exercise induced chest pain that relived with rest. The stress electrocardiogram showed sinus tachycardia, normal stress conduction and no stress arrhythmias. Stress EKG sowed isolated T wave inversion in lead aVL that persisted 4 min into recovery. Stress EKG is equivocal for ischemia.  2. The overall quality of the study is good. Left ventricular cavity is noted to be normal on the rest and stress studies. Gated SPECT images reveal normal myocardial thickening and wall motion. The left  ventricular ejection fraction was calculated or visually estimated to be 68% Small area of basal inferoseptal perfusion defect with no significant reversibility, likely due to breast attenuation artifact, with imaging performed in sitting position. .  3. Low risk SPECT imaging study. Recommend clinical correlation given patient's exercise induce chest pain and localized EKG changes, as described.  Lower extremity arterial duplex 06/02/2017: No hemodynamically significant stenoses are identified in the right lower extremity arterial system. No hemodynamically significant stenoses are identified in the left lower extremity arterial system. This exam reveals normal perfusion of the right lower extremity RABI 0.98 and mildly decreased perfusion of the left lower extremity, noted at the post tibial artery level ABI 0.94. However there was triphasic (normal waveform) noted at the level of the ankle. Clinical correlation recommended.  CABG 04/09/2017 (Dr. Roxan Hockey): LIMA - LAD SVG-DIAG 1A- DIAG1B SVG-OM1 - OM2  Coronary angiography 04/01/2017: LM: Distal 40% stenosis LAD: Ostial to mid 99-70% stenosis.  Diagonal 1 with mid 70% stenosis. LCx: Large, dominant vessel.  Ostial 80%, mid 60% stenosis.  OM 2 with proximal 60% stenosis. RCA: Nondominant vessel.  Mid diffuse 80% stenosis.  Recent labs: Results for CHRITINA, OSIER (MRN [id number]) as of 04/13/2019 12:54  Ref. Range  02/15/2019 08:22  Total CHOL/HDL Ratio Latest Ref Range: 0.0 - 4.4 ratio 3.8  Cholesterol, Total Latest Ref Range: 100 - 199 mg/dL 216 (H)  HDL Cholesterol Latest Ref Range: >39 mg/dL 57  Triglycerides Latest Ref Range: 0 - 149 mg/dL 206 (H)  VLDL Cholesterol Cal Latest Ref Range: 5 - 40 mg/dL 36  LDL Chol Calc (NIH) Latest Ref Range: 0 - 99 mg/dL 123 (H)    03/15/2017:  Cholesterol 301, triglycerides 585, HDL 42, LDL 150.  05/05/2017: H/H 11.5/37.1.  MCV 27.  Triglycerides 436.  Review of Systems  Constitution: Negative for decreased appetite, malaise/fatigue, weight gain and weight loss.  HENT: Negative for congestion.   Eyes: Negative for visual disturbance.  Cardiovascular: Positive for chest pain. Negative for dyspnea on exertion, leg swelling, palpitations and syncope.  Respiratory: Negative for cough.   Endocrine: Negative for cold intolerance.  Hematologic/Lymphatic: Does not bruise/bleed easily.  Skin: Negative for itching and rash.  Musculoskeletal: Negative for myalgias.  Gastrointestinal: Negative for abdominal pain, nausea and vomiting.  Genitourinary: Negative for dysuria.  Neurological: Negative for dizziness and weakness.  Psychiatric/Behavioral: The patient is not nervous/anxious.   All other systems reviewed and are negative.          Vitals:   04/13/19 1345  BP: 131/70  Pulse: 66  Temp: 97.6 F (36.4 C)  SpO2: 96%     Body mass index is 24.69 kg/m.    Filed Weights   04/13/19 1345  Weight: 135 lb (61.2 kg)     Objective:   Objective   Physical Exam  Constitutional: She is oriented to person, place, and time. She appears well-developed and well-nourished. No distress.  HENT:  Head: Normocephalic and atraumatic.  Eyes: Pupils are equal, round, and reactive to light. Conjunctivae are normal.  Neck: No JVD present.  Cardiovascular: Normal rate, regular rhythm and intact distal pulses.  Pulmonary/Chest: Effort normal and  breath sounds normal. She has no wheezes. She has no rales.  Sternotomy scar  Abdominal: Soft. Bowel sounds are normal. There is no rebound.  Musculoskeletal:        General: No edema.  Lymphadenopathy:    She has no cervical adenopathy.  Neurological: She is alert and oriented to person, place,  and time. No cranial nerve deficit.  Skin: Skin is warm and dry.  Psychiatric: She has a normal mood and affect.  Nursing note and vitals reviewed.         Assessment & Recommendations:   68 year old Caucasian female with prediabetes, hyperlipidemia, history of basal cell carcinoma, severe multivessel coronary artery disease status post CABG 5 on 04/09/2017.  Chest pain: Recurrent chest pain, now also present at rest, concerning for unstable angina.  Given lack of definitive diagnosis with noninvasive imaging, next best option would be to proceed with coronary and bypass graft angiography for definitive diagnosis and possibly invasive management.  Medical management is limited due to her inability to tolerate Imdur due to severe headache. Continue aspirin, Lipitor 20 mg daily, metoprolol succinate 25 mg daily.  Added Ranexa 500 mg twice daily.  Hyperlipidemia:  Now improved.  LDL remains suboptimal at 123.  She is currently on Lipitor 40 mg daily.  Ideally, would like to add Zetia or Repatha.  Patient wants to hold off until after coronary angiography.  Follow-up after coronary/bypass graft angiography.  Nigel Mormon, MD [facility] Pager: [phone] Office: [phone] If no answer Cell [phone]

## 2019-04-25 NOTE — Progress Notes (Signed)
Ambulated in hallway and to bathroom to void tol well 

## 2019-04-26 ENCOUNTER — Telehealth (HOSPITAL_COMMUNITY): Payer: Self-pay

## 2019-04-26 MED FILL — Nitroglycerin IV Soln 100 MCG/ML in D5W: INTRA_ARTERIAL | Qty: 10 | Status: AC

## 2019-04-26 NOTE — Telephone Encounter (Signed)
Pt insurance is active and benefits verified through [facility]. Co-pay $45.00, DED $0.00/$0.00 met, out of pocket $4,200.00/$609.32 met, co-insurance 0%. No pre-authorization required. Andrew/[employer], 04/26/2019 @ 4:27PM, ZNB#[id number]  Will pass to RN Navigator for review.

## 2019-05-02 ENCOUNTER — Other Ambulatory Visit: Payer: Self-pay | Admitting: Cardiology

## 2019-05-02 DIAGNOSIS — R7309 Other abnormal glucose: Secondary | ICD-10-CM

## 2019-05-02 DIAGNOSIS — R739 Hyperglycemia, unspecified: Secondary | ICD-10-CM

## 2019-05-02 NOTE — Progress Notes (Signed)
Follow up visit  Subjective:   Christine [name], female    DOB: 01-Mar-1951, 68 y.o.   MRN: [id number]   Chief Complaint  Patient presents with  . Coronary Artery Disease  . Follow-up    7-10  . Results    cath    HPI  68 year old Caucasian female with uncontrolled diabetes, hyperlipidemia, history of basal cell carcinoma, severe multivessel coronary artery disease status post CABG 5 on 04/09/2017.  Given her persistent chest pain at rest, she underwent coronary and bypass graft angiography that showed severe native vessel CAD, but with patient grafts. Nondominant RCA has severe disease, is un-revascualrized. I also checked her A1C given elevated random blood glucose. Her A1C has jumped from 9% in 2018, now to 13%.  She did not tolerate Imdur or Ranexa.   Past Medical History:  Diagnosis Date  . Angina pectoris ([place])   . Arthritis   . CAD, multiple vessel   . Cancer (HCC)    18 MONTHS  BASAL CELL   . Chest pain ON EXERTION   . Dyspnea    W/ EXERTION   . GERD (gastroesophageal reflux disease)   . H/O echocardiogram 1990   NORMAL  . Hyperlipidemia   . PAC (premature atrial contraction)   . Pre-diabetes      Past Surgical History:  Procedure Laterality Date  . BASAL CELL CARCINOMA EXCISION     18 MO OLD  . CARDIAC CATHETERIZATION    . colonscopy    . CORONARY ARTERY BYPASS GRAFT N/A 04/09/2017   Procedure: CORONARY ARTERY BYPASS GRAFTING (CABG)x5 USING RIGHT SAPHENOUS VEIN, HARVESTED ENDOSCOPICALLY AND LEFT INTERNAL MAMMARY ARTERY;  Surgeon: Melrose Nakayama, MD;  Location: [facility];  Service: Open Heart Surgery;  Laterality: N/A;  . DRUG INDUCED ENDOSCOPY    . LEFT HEART CATH AND CORONARY ANGIOGRAPHY N/A 04/01/2017   Procedure: LEFT HEART CATH AND CORONARY ANGIOGRAPHY;  Surgeon: Nigel Mormon, MD;  Location: [facility];  Service: Cardiovascular;  Laterality: N/A;  . LEFT HEART CATH AND CORS/GRAFTS ANGIOGRAPHY N/A 04/25/2019   Procedure: LEFT HEART  CATH AND CORS/GRAFTS ANGIOGRAPHY;  Surgeon: Nigel Mormon, MD;  Location: [facility];  Service: Cardiovascular;  Laterality: N/A;  . TEE WITHOUT CARDIOVERSION N/A 04/09/2017   Procedure: TRANSESOPHAGEAL ECHOCARDIOGRAM (TEE);  Surgeon: Melrose Nakayama, MD;  Location: [facility];  Service: Open Heart Surgery;  Laterality: N/A;     Social History   Socioeconomic History  . Marital status: Married    Spouse name: Eddie Dibbles  . Number of children: 2  . Years of education: Not on file  . Highest education level: Not on file  Occupational History  . Not on file  Social Needs  . Financial resource strain: Not on file  . Food insecurity    Worry: Not on file    Inability: Not on file  . Transportation needs    Medical: Not on file    Non-medical: Not on file  Tobacco Use  . Smoking status: Never Smoker  . Smokeless tobacco: Never Used  Substance and Sexual Activity  . Alcohol use: No    Frequency: Never  . Drug use: No  . Sexual activity: Not on file  Lifestyle  . Physical activity    Days per week: 4 days    Minutes per session: 30 min  . Stress: To some extent  Relationships  . Social connections    Talks on phone: Not on file  Gets together: Not on file    Attends religious service: Not on file    Active member of club or organization: Not on file    Attends meetings of clubs or organizations: Not on file    Relationship status: Not on file  . Intimate partner violence    Fear of current or ex partner: Not on file    Emotionally abused: Not on file    Physically abused: Not on file    Forced sexual activity: Not on file  Other Topics Concern  . Not on file  Social History Narrative  . Not on file     Family History  Problem Relation Age of Onset  . Cancer Mother        LUNG  . Cancer Father        COLON, MELANOMA  . Heart disease Father 53       MI   . Stroke Father   . Heart disease Brother 51       MI  . Cancer Brother      Current  Outpatient Medications on File Prior to Visit  Medication Sig Dispense Refill  . aspirin EC 81 MG tablet Take 81 mg by mouth every evening.     Marland Kitchen atorvastatin (LIPITOR) 40 MG tablet Take 1 tablet (40 mg total) by mouth daily. (Patient taking differently: Take 40 mg by mouth every evening. ) 90 tablet 1  . metoprolol succinate (TOPROL-XL) 25 MG 24 hr tablet Take 1 tablet (25 mg total) by mouth 2 (two) times daily. 60 tablet 3  . Multiple Vitamins-Minerals (MULTIVITAMIN WITH MINERALS) tablet Take 1 tablet by mouth 4 (four) times a week.     . nitroGLYCERIN (NITROSTAT) 0.4 MG SL tablet Place 1 tablet (0.4 mg total) under the tongue every 5 (five) minutes as needed for chest pain. 90 tablet 3  . pantoprazole (PROTONIX) 40 MG tablet Take 40 mg by mouth daily.      No current facility-administered medications on file prior to visit.     Cardiovascular studies:  Coronary/bypass graft angiography 04/25/2019: LM: Normal LAD: Severe proximal calcific disease, followed by 10% occlusion LCx: Severe calcific ostial disease, followed by severe mid vessel disease RCA: Nondominant small vessel with severe diffuse disease  LIMA-LAD: Patent with excellent distal runoff SVG-OM1, OM2: Patent with excellent distal runoff SVG-Diag1, diag2:  Patent with excellent distal runoff  LVEDP 1 mmHg  Severe native vessel CAD Patent grafts (LIMA-LAD, SVG-Diag seq graft, SVG-OM, seq graft)  Chest pain could be due to residual native vessel CAD, microvascular CAD. Continue medical management. Consider repeat cardiac rehab.   EKG 04/13/2019: Probable sinus rhythm 66 bpm.  Left axis deviation.  Exercise myoview stress 01/28/2018:  1. The patient performed treadmill exercise using Bruce protocol, completing 6:13 minutes. The patient completed an estimated workload of 7.3 METS, reaching of the maximum predicted heart rate. Low exercise capacity for age. Normal hemodynamic response. Patient developed exercise  induced chest pain that relived with rest. The stress electrocardiogram showed sinus tachycardia, normal stress conduction and no stress arrhythmias. Stress EKG sowed isolated T wave inversion in lead aVL that persisted 4 min into recovery. Stress EKG is equivocal for ischemia.  2. The overall quality of the study is good.  Left ventricular cavity is noted to be normal on the rest and stress studies.  Gated SPECT images reveal normal myocardial thickening and wall motion.  The left ventricular ejection fraction was calculated or visually estimated to be 68% Small  area of basal inferoseptal perfusion defect with no significant reversibility, likely due to breast attenuation artifact, with imaging performed in sitting position. .   3. Low risk SPECT imaging study. Recommend clinical correlation given patient's exercise induce chest pain and localized EKG changes, as described.  Lower extremity arterial duplex 06/02/2017: No hemodynamically significant stenoses are identified in the right lower extremity arterial system. No hemodynamically significant stenoses are identified in the left lower extremity arterial system. This exam reveals normal perfusion of the right lower extremity RABI 0.98 and mildly decreased perfusion of the left lower extremity, noted at the post tibial artery level ABI 0.94. However there was triphasic (normal waveform) noted at the level of the ankle.  Clinical correlation recommended.  CABG 04/09/2017 (Dr. Roxan Hockey): LIMA - LAD SVG-DIAG 1A - DIAG 1B SVG-OM1 - OM2  Coronary angiography 04/01/2017: LM: Distal 40% stenosis LAD: Ostial to mid 99-70% stenosis.  Diagonal 1 with mid 70% stenosis. LCx: Large, dominant vessel.  Ostial 80%, mid 60% stenosis.  OM 2 with proximal 60% stenosis. RCA: Nondominant vessel.  Mid diffuse 80% stenosis.  Recent labs: Results for Christine [name], Christine [name] (MRN [id number]) as of 04/13/2019 12:54  Ref. Range 02/15/2019 08:22  Total CHOL/HDL Ratio Latest  Ref Range: 0.0 - 4.4 ratio 3.8  Cholesterol, Total Latest Ref Range: 100 - 199 mg/dL 216 (H)  HDL Cholesterol Latest Ref Range: >39 mg/dL 57  Triglycerides Latest Ref Range: 0 - 149 mg/dL 206 (H)  VLDL Cholesterol Cal Latest Ref Range: 5 - 40 mg/dL 36  LDL Chol Calc (NIH) Latest Ref Range: 0 - 99 mg/dL 123 (H)    03/15/2017:  Cholesterol 301, triglycerides 585, HDL 42, LDL 150.  05/05/2017: H/H 11.5/37.1.  MCV 27.  Triglycerides 436.  Review of Systems  Constitution: Negative for decreased appetite, malaise/fatigue, weight gain and weight loss.  HENT: Negative for congestion.   Eyes: Negative for visual disturbance.  Cardiovascular: Positive for chest pain. Negative for dyspnea on exertion, leg swelling, palpitations and syncope.  Respiratory: Negative for cough.   Endocrine: Negative for cold intolerance.  Hematologic/Lymphatic: Does not bruise/bleed easily.  Skin: Negative for itching and rash.  Musculoskeletal: Negative for myalgias.  Gastrointestinal: Negative for abdominal pain, nausea and vomiting.  Genitourinary: Negative for dysuria.  Neurological: Negative for dizziness and weakness.  Psychiatric/Behavioral: The patient is not nervous/anxious.   All other systems reviewed and are negative.       Vitals:   05/05/19 1433  BP: 122/66  Pulse: 68  Temp: 97.9 F (36.6 C)  SpO2: 98%     Body mass index is 24.4 kg/m. Filed Weights   05/05/19 1433  Weight: 133 lb 6.4 oz (60.5 kg)     Objective:   Physical Exam  Constitutional: She is oriented to person, place, and time. She appears well-developed and well-nourished. No distress.  HENT:  Head: Normocephalic and atraumatic.  Eyes: Pupils are equal, round, and reactive to light. Conjunctivae are normal.  Neck: No JVD present.  Cardiovascular: Normal rate, regular rhythm and intact distal pulses.  Pulmonary/Chest: Effort normal and breath sounds normal. She has no wheezes. She has no rales.  Sternotomy scar   Abdominal: Soft. Bowel sounds are normal. There is no rebound.  Musculoskeletal:        General: No edema.  Lymphadenopathy:    She has no cervical adenopathy.  Neurological: She is alert and oriented to person, place, and time. No cranial nerve deficit.  Skin: Skin is warm and dry.  Psychiatric:  She has a normal mood and affect.  Nursing note and vitals reviewed.         Assessment & Recommendations:   68 year old Caucasian female with uncontrolled diabetes, hyperlipidemia, history of basal cell carcinoma, severe multivessel coronary artery disease status post CABG 5 on 04/09/2017.  Diabetes melitus: Uncontrolled. She has not tolerate metformin the past. I wanted to start her on Jardiance for CV benefits, but she is currently having a yeast infection and would like to avoid this. She has an appt with Dr. Shelia Media soon. I have encouraged her to discuss with him re: DM management.   CAD with stable angina: Severe native vessel disease with patent grafts. Her pain is likely from small vessel CAD, and nondominant RCA, which is not amenable to revascularization.She has not tolerate Imdur and Ranexa. COntnue metoprolol succinate 25 mg daily. Started amlodipine 2.5 mg daily. Conitnue Aspirin. Discussion re: statin below.   Hyperlipidemia:  Now improved.  LDL remains suboptimal at 123.  She is currently on Lipitor 40 mg daily, dose recently increased. Repeat lipid panel in 2 months. If LDL still >100, will discuss adding PCSK9 inhibitor.  I had a long discussion with the patient regarding diet and lifestyle modification, especially reducing carbohydrate intake and following mediterranean style diet. I offered her cardiac rehab, but she is not interested. I have encouraged her to walk/bike at least 30 min daily.   Diet & Lifestyle recommendations:  Physical activity recommendation (The Physical Activity Guidelines for Americans. JAMA 2018;Nov 12) At least 150-300 minutes a week of  moderate-intensity, or 75-150 minutes a week of vigorous-intensity aerobic physical activity, or an equivalent combination of moderate- and vigorous-intensity aerobic activity. Adults should perform muscle-strengthening activities on 2 or more days a week. Older adults should do multicomponent physical activity that includes balance training as well as aerobic and muscle-strengthening activities. Benefits of increased physical activity include lower risk of mortality including cardiovascular mortality, lower risk of cardiovascular events and associated risk factors (hypertension and diabetes), and lower risk of many cancers (including bladder, breast, colon, endometrium, esophagus, kidney, lung, and stomach). Additional improvments have been seen in cognition, risk of dementia, anxiety and depression, improved bone health, lower risk of falls, and associated injuries.  Dietary recommendation The 2019 ACC/AHA guidelines promote nutrition as a main fixture of cardiovascular wellness, with a recommendation for a varied diet of fruit, vegetables, fish, legumes, and whole grains (Class I), as well as recommendations to reduce sodium, cholesterol, processed meats, and refined sugars (Class IIa recommendation).10 Sodium intake, a topic of some controversy as of late, is recommended to be kept at 1,500 mg/day or less, far below the average daily intake in the [place] of 3,409 mg/day, and notably below that of previous [place] recommendations for 300mg /day.10,11 For those unable to reach 1,500 mg/day, they recommend at least a reduction of 1000 mg/day.  A [place]-Mediterranean Diet With Intermittent Fasting: JACC Review Topic of the Week. J Am Coll Cardiol [id number] [place]-Mediterranean diet, it is supplemented with extra-virgin olive oil (EVOO), which is the principle fat source, along with moderate amounts of dairy (particularly yogurt and cheese) and eggs, as well as modest amounts of alcohol consumption (ideally red  wine with the evening meal), but few red and processed meats.  Total time spent with patient was 60 minutes and greater than 50% of that time was spent in counseling and coordination care with the patient regarding complex decision making and discussion as state above.   F/u in March 2021.   Oriya Kettering  Esther Hardy, MD [facility] Pager: [phone] Office: [phone] If no answer Cell [phone]

## 2019-05-02 NOTE — Telephone Encounter (Signed)
Please read

## 2019-05-03 ENCOUNTER — Other Ambulatory Visit (HOSPITAL_COMMUNITY): Payer: Self-pay | Admitting: Cardiology

## 2019-05-03 ENCOUNTER — Telehealth: Payer: Self-pay | Admitting: Cardiology

## 2019-05-03 DIAGNOSIS — R7309 Other abnormal glucose: Secondary | ICD-10-CM | POA: Diagnosis not present

## 2019-05-04 DIAGNOSIS — Z6825 Body mass index (BMI) 25.0-25.9, adult: Secondary | ICD-10-CM | POA: Diagnosis not present

## 2019-05-04 DIAGNOSIS — Z1231 Encounter for screening mammogram for malignant neoplasm of breast: Secondary | ICD-10-CM | POA: Diagnosis not present

## 2019-05-04 DIAGNOSIS — Z779 Other contact with and (suspected) exposures hazardous to health: Secondary | ICD-10-CM | POA: Diagnosis not present

## 2019-05-04 DIAGNOSIS — Z01419 Encounter for gynecological examination (general) (routine) without abnormal findings: Secondary | ICD-10-CM | POA: Diagnosis not present

## 2019-05-04 DIAGNOSIS — N760 Acute vaginitis: Secondary | ICD-10-CM | POA: Diagnosis not present

## 2019-05-04 LAB — HEMOGLOBIN A1C
Est. average glucose Bld gHb Est-mCnc: 332 mg/dL
Hgb A1c MFr Bld: 13.2 % — ABNORMAL HIGH (ref 4.8–5.6)

## 2019-05-05 ENCOUNTER — Encounter: Payer: Self-pay | Admitting: Cardiology

## 2019-05-05 ENCOUNTER — Other Ambulatory Visit: Payer: Self-pay

## 2019-05-05 ENCOUNTER — Ambulatory Visit (INDEPENDENT_AMBULATORY_CARE_PROVIDER_SITE_OTHER): Payer: Medicare HMO | Admitting: Cardiology

## 2019-05-05 VITALS — BP 122/66 | HR 68 | Temp 97.9°F | Ht 62.0 in | Wt 133.4 lb

## 2019-05-05 DIAGNOSIS — E782 Mixed hyperlipidemia: Secondary | ICD-10-CM

## 2019-05-05 DIAGNOSIS — E1165 Type 2 diabetes mellitus with hyperglycemia: Secondary | ICD-10-CM | POA: Diagnosis not present

## 2019-05-05 DIAGNOSIS — I25708 Atherosclerosis of coronary artery bypass graft(s), unspecified, with other forms of angina pectoris: Secondary | ICD-10-CM

## 2019-05-05 MED ORDER — AMLODIPINE BESYLATE 2.5 MG PO TABS: 5.0000 mg | ORAL_TABLET | Freq: Every day | ORAL | 3 refills | Status: DC

## 2019-05-05 NOTE — Patient Instructions (Signed)
Diet & Lifestyle recommendations:  Physical activity recommendation (The Physical Activity Guidelines for Americans. JAMA 2018;Nov 12) At least 150-300 minutes a week of moderate-intensity, or 75-150 minutes a week of vigorous-intensity aerobic physical activity, or an equivalent combination of moderate- and vigorous-intensity aerobic activity. Adults should perform muscle-strengthening activities on 2 or more days a week. Older adults should do multicomponent physical activity that includes balance training as well as aerobic and muscle-strengthening activities. Benefits of increased physical activity include lower risk of mortality including cardiovascular mortality, lower risk of cardiovascular events and associated risk factors (hypertension and diabetes), and lower risk of many cancers (including bladder, breast, colon, endometrium, esophagus, kidney, lung, and stomach). Additional improvments have been seen in cognition, risk of dementia, anxiety and depression, improved bone health, lower risk of falls, and associated injuries.  Dietary recommendation The 2019 ACC/AHA guidelines promote nutrition as a main fixture of cardiovascular wellness, with a recommendation for a varied diet of fruit, vegetables, fish, legumes, and whole grains (Class I), as well as recommendations to reduce sodium, cholesterol, processed meats, and refined sugars (Class IIa recommendation).10 Sodium intake, a topic of some controversy as of late, is recommended to be kept at 1,500 mg/day or less, far below the average daily intake in the [place] of 3,409 mg/day, and notably below that of previous [place] recommendations for 300mg /day.10,11 For those unable to reach 1,500 mg/day, they recommend at least a reduction of 1000 mg/day.  A [place]-Mediterranean Diet With Intermittent Fasting: JACC Review Topic of the Week. J Am Coll Cardiol 5974;[id number] [place]-Mediterranean diet, it is supplemented with extra-virgin olive oil (EVOO),  which is the principle fat source, along with moderate amounts of dairy (particularly yogurt and cheese) and eggs, as well as modest amounts of alcohol consumption (ideally red wine with the evening meal), but few red and processed meats.

## 2019-05-07 ENCOUNTER — Other Ambulatory Visit: Payer: Self-pay | Admitting: Cardiology

## 2019-05-08 DIAGNOSIS — E119 Type 2 diabetes mellitus without complications: Secondary | ICD-10-CM | POA: Diagnosis not present

## 2019-05-08 DIAGNOSIS — E559 Vitamin D deficiency, unspecified: Secondary | ICD-10-CM | POA: Diagnosis not present

## 2019-05-08 DIAGNOSIS — E7800 Pure hypercholesterolemia, unspecified: Secondary | ICD-10-CM | POA: Diagnosis not present

## 2019-05-08 NOTE — Telephone Encounter (Signed)
From pt

## 2019-05-09 NOTE — Telephone Encounter (Signed)
Please let the patient know I will call her today

## 2019-05-10 NOTE — Telephone Encounter (Signed)
Spoke with the patient

## 2019-05-16 DIAGNOSIS — E1165 Type 2 diabetes mellitus with hyperglycemia: Secondary | ICD-10-CM | POA: Diagnosis not present

## 2019-05-16 DIAGNOSIS — Z6824 Body mass index (BMI) 24.0-24.9, adult: Secondary | ICD-10-CM | POA: Diagnosis not present

## 2019-05-16 DIAGNOSIS — I25810 Atherosclerosis of coronary artery bypass graft(s) without angina pectoris: Secondary | ICD-10-CM | POA: Diagnosis not present

## 2019-05-16 DIAGNOSIS — E7800 Pure hypercholesterolemia, unspecified: Secondary | ICD-10-CM | POA: Diagnosis not present

## 2019-05-17 DIAGNOSIS — Z23 Encounter for immunization: Secondary | ICD-10-CM | POA: Diagnosis not present

## 2019-05-17 DIAGNOSIS — Z0000 Encounter for general adult medical examination without abnormal findings: Secondary | ICD-10-CM | POA: Diagnosis not present

## 2019-05-17 DIAGNOSIS — E7800 Pure hypercholesterolemia, unspecified: Secondary | ICD-10-CM | POA: Diagnosis not present

## 2019-05-17 DIAGNOSIS — E1165 Type 2 diabetes mellitus with hyperglycemia: Secondary | ICD-10-CM | POA: Diagnosis not present

## 2019-05-17 DIAGNOSIS — I25810 Atherosclerosis of coronary artery bypass graft(s) without angina pectoris: Secondary | ICD-10-CM | POA: Diagnosis not present

## 2019-05-30 ENCOUNTER — Telehealth: Payer: Self-pay

## 2019-05-31 DIAGNOSIS — I25810 Atherosclerosis of coronary artery bypass graft(s) without angina pectoris: Secondary | ICD-10-CM | POA: Diagnosis not present

## 2019-05-31 DIAGNOSIS — Z6824 Body mass index (BMI) 24.0-24.9, adult: Secondary | ICD-10-CM | POA: Diagnosis not present

## 2019-05-31 DIAGNOSIS — E7800 Pure hypercholesterolemia, unspecified: Secondary | ICD-10-CM | POA: Diagnosis not present

## 2019-05-31 DIAGNOSIS — E1165 Type 2 diabetes mellitus with hyperglycemia: Secondary | ICD-10-CM | POA: Diagnosis not present

## 2019-06-20 DIAGNOSIS — E1165 Type 2 diabetes mellitus with hyperglycemia: Secondary | ICD-10-CM | POA: Diagnosis not present

## 2019-06-27 DIAGNOSIS — Z6824 Body mass index (BMI) 24.0-24.9, adult: Secondary | ICD-10-CM | POA: Diagnosis not present

## 2019-06-27 DIAGNOSIS — E7800 Pure hypercholesterolemia, unspecified: Secondary | ICD-10-CM | POA: Diagnosis not present

## 2019-06-27 DIAGNOSIS — E1165 Type 2 diabetes mellitus with hyperglycemia: Secondary | ICD-10-CM | POA: Diagnosis not present

## 2019-06-27 DIAGNOSIS — I25810 Atherosclerosis of coronary artery bypass graft(s) without angina pectoris: Secondary | ICD-10-CM | POA: Diagnosis not present

## 2019-07-04 DIAGNOSIS — R69 Illness, unspecified: Secondary | ICD-10-CM | POA: Diagnosis not present

## 2019-07-14 DIAGNOSIS — Z20828 Contact with and (suspected) exposure to other viral communicable diseases: Secondary | ICD-10-CM | POA: Diagnosis not present

## 2019-07-14 DIAGNOSIS — Z20822 Contact with and (suspected) exposure to covid-19: Secondary | ICD-10-CM | POA: Diagnosis not present

## 2019-07-19 DIAGNOSIS — R0989 Other specified symptoms and signs involving the circulatory and respiratory systems: Secondary | ICD-10-CM | POA: Diagnosis not present

## 2019-07-19 DIAGNOSIS — K219 Gastro-esophageal reflux disease without esophagitis: Secondary | ICD-10-CM | POA: Diagnosis not present

## 2019-07-19 DIAGNOSIS — R1310 Dysphagia, unspecified: Secondary | ICD-10-CM | POA: Diagnosis not present

## 2019-07-20 DIAGNOSIS — K219 Gastro-esophageal reflux disease without esophagitis: Secondary | ICD-10-CM | POA: Diagnosis not present

## 2019-07-20 DIAGNOSIS — R1310 Dysphagia, unspecified: Secondary | ICD-10-CM | POA: Diagnosis not present

## 2019-07-20 DIAGNOSIS — K3189 Other diseases of stomach and duodenum: Secondary | ICD-10-CM | POA: Diagnosis not present

## 2019-07-20 DIAGNOSIS — B9681 Helicobacter pylori [H. pylori] as the cause of diseases classified elsewhere: Secondary | ICD-10-CM | POA: Diagnosis not present

## 2019-07-20 DIAGNOSIS — K2950 Unspecified chronic gastritis without bleeding: Secondary | ICD-10-CM | POA: Diagnosis not present

## 2019-07-20 DIAGNOSIS — Z951 Presence of aortocoronary bypass graft: Secondary | ICD-10-CM | POA: Diagnosis not present

## 2019-07-20 DIAGNOSIS — I2510 Atherosclerotic heart disease of native coronary artery without angina pectoris: Secondary | ICD-10-CM | POA: Diagnosis not present

## 2019-07-27 DIAGNOSIS — E782 Mixed hyperlipidemia: Secondary | ICD-10-CM | POA: Diagnosis not present

## 2019-07-27 DIAGNOSIS — E119 Type 2 diabetes mellitus without complications: Secondary | ICD-10-CM | POA: Diagnosis not present

## 2019-07-27 DIAGNOSIS — I25708 Atherosclerosis of coronary artery bypass graft(s), unspecified, with other forms of angina pectoris: Secondary | ICD-10-CM | POA: Diagnosis not present

## 2019-07-28 LAB — LIPID PANEL
Chol/HDL Ratio: 3.6 ratio (ref 0.0–4.4)
Cholesterol, Total: 205 mg/dL — ABNORMAL HIGH (ref 100–199)
HDL: 57 mg/dL (ref >39)
LDL Chol Calc (NIH): 121 mg/dL — ABNORMAL HIGH (ref 0–99)
Triglycerides: 154 mg/dL — ABNORMAL HIGH (ref 0–149)
VLDL Cholesterol Cal: 27 mg/dL (ref 5–40)

## 2019-08-07 ENCOUNTER — Encounter: Payer: Self-pay | Admitting: Cardiology

## 2019-08-07 ENCOUNTER — Ambulatory Visit: Payer: Medicare HMO | Admitting: Cardiology

## 2019-08-07 ENCOUNTER — Other Ambulatory Visit: Payer: Self-pay

## 2019-08-07 VITALS — BP 121/62 | HR 73 | Temp 98.8°F | Resp 14 | Ht 62.0 in | Wt 134.1 lb

## 2019-08-07 DIAGNOSIS — E7800 Pure hypercholesterolemia, unspecified: Secondary | ICD-10-CM | POA: Diagnosis not present

## 2019-08-07 DIAGNOSIS — E1165 Type 2 diabetes mellitus with hyperglycemia: Secondary | ICD-10-CM | POA: Diagnosis not present

## 2019-08-07 DIAGNOSIS — E782 Mixed hyperlipidemia: Secondary | ICD-10-CM

## 2019-08-07 DIAGNOSIS — I25708 Atherosclerosis of coronary artery bypass graft(s), unspecified, with other forms of angina pectoris: Secondary | ICD-10-CM | POA: Diagnosis not present

## 2019-08-07 DIAGNOSIS — Z6824 Body mass index (BMI) 24.0-24.9, adult: Secondary | ICD-10-CM | POA: Diagnosis not present

## 2019-08-07 DIAGNOSIS — I25810 Atherosclerosis of coronary artery bypass graft(s) without angina pectoris: Secondary | ICD-10-CM | POA: Diagnosis not present

## 2019-08-07 MED ORDER — ATORVASTATIN CALCIUM 80 MG PO TABS: 80.0000 mg | ORAL_TABLET | Freq: Every day | ORAL | 3 refills | Status: DC

## 2019-08-07 NOTE — Progress Notes (Signed)
Follow up visit  Subjective:   Christine [name], female    DOB: 12-Oct-1950, 69 y.o.   MRN: [id number]   Chief Complaint  Patient presents with  . Coronary Artery Disease  . Follow-up    HPI  69 year old Caucasian female with uncontrolled diabetes, hyperlipidemia, history of basal cell carcinoma, severe multivessel coronary artery disease status post CABG 5 on 04/09/2017.  Given her persistent chest pain at rest, she underwent coronary and bypass graft angiography in 2020 that showed severe native vessel CAD, but with patient grafts. Nondominant RCA has severe disease, is un-revascualrized. I also checked her A1C given elevated random blood glucose. Her A1C had jumped from 9% in 2018, now to 13%. She did not tolerate Imdur and Ranexa. I continued metoprolol succinate 25 mg daily, started amlodipine 2.5 mg daily. I continued Aspirin, statin, with goal of adding PCKS9 inhibitor, should her LDL stay above 100. While her TG has improved, LDL remains above goal at 121.   She has started walking 2 miles regularly, and denies chest pain, shortness of breath, palpitations, leg edema, orthopnea, PND, TIA/syncope. She endorses that she has not been compliant with heart healthy diet.   Diabetes is managed by PCP. Reportedly doing better.    Current Outpatient Medications on File Prior to Visit  Medication Sig Dispense Refill  . amLODipine (NORVASC) 2.5 MG tablet Take 2 tablets (5 mg total) by mouth daily. 30 tablet 3  . aspirin EC 81 MG tablet Take 81 mg by mouth every evening.     Marland Kitchen atorvastatin (LIPITOR) 40 MG tablet Take 1 tablet (40 mg total) by mouth daily. (Patient taking differently: Take 40 mg by mouth every evening. ) 90 tablet 1  . metoprolol tartrate (LOPRESSOR) 25 MG tablet Take 1 tablet by mouth twice daily 180 tablet 0  . Multiple Vitamins-Minerals (MULTIVITAMIN WITH MINERALS) tablet Take 1 tablet by mouth 4 (four) times a week.     . nitroGLYCERIN (NITROSTAT) 0.4 MG SL tablet  Place 1 tablet (0.4 mg total) under the tongue every 5 (five) minutes as needed for chest pain. 90 tablet 3  . pantoprazole (PROTONIX) 40 MG tablet Take 40 mg by mouth daily.     . RYBELSUS 7 MG TABS Take 1 tablet by mouth every morning.     No current facility-administered medications on file prior to visit.    Cardiovascular studies:  Coronary/bypass graft angiography 04/25/2019: LM: Normal LAD: Severe proximal calcific disease, followed by 10% occlusion LCx: Severe calcific ostial disease, followed by severe mid vessel disease RCA: Nondominant small vessel with severe diffuse disease  LIMA-LAD: Patent with excellent distal runoff SVG-OM1, OM2: Patent with excellent distal runoff SVG-Diag1, diag2:  Patent with excellent distal runoff  LVEDP 1 mmHg  Severe native vessel CAD Patent grafts (LIMA-LAD, SVG-Diag seq graft, SVG-OM, seq graft)  Chest pain could be due to residual native vessel CAD, microvascular CAD. Continue medical management. Consider repeat cardiac rehab.   EKG 04/13/2019: Probable sinus rhythm 66 bpm.  Left axis deviation.  Exercise myoview stress 01/28/2018:  1. The patient performed treadmill exercise using Bruce protocol, completing 6:13 minutes. The patient completed an estimated workload of 7.3 METS, reaching of the maximum predicted heart rate. Low exercise capacity for age. Normal hemodynamic response. Patient developed exercise induced chest pain that relived with rest. The stress electrocardiogram showed sinus tachycardia, normal stress conduction and no stress arrhythmias. Stress EKG sowed isolated T wave inversion in lead aVL that persisted 4 min into recovery. Stress  EKG is equivocal for ischemia.  2. The overall quality of the study is good.  Left ventricular cavity is noted to be normal on the rest and stress studies.  Gated SPECT images reveal normal myocardial thickening and wall motion.  The left ventricular ejection fraction was calculated or  visually estimated to be 68% Small area of basal inferoseptal perfusion defect with no significant reversibility, likely due to breast attenuation artifact, with imaging performed in sitting position. .   3. Low risk SPECT imaging study. Recommend clinical correlation given patient's exercise induce chest pain and localized EKG changes, as described.  Lower extremity arterial duplex 06/02/2017: No hemodynamically significant stenoses are identified in the right lower extremity arterial system. No hemodynamically significant stenoses are identified in the left lower extremity arterial system. This exam reveals normal perfusion of the right lower extremity RABI 0.98 and mildly decreased perfusion of the left lower extremity, noted at the post tibial artery level ABI 0.94. However there was triphasic (normal waveform) noted at the level of the ankle.  Clinical correlation recommended.  CABG 04/09/2017 (Dr. Roxan Hockey): LIMA - LAD SVG-DIAG 1A - DIAG 1B SVG-OM1 - OM2  Coronary angiography 04/01/2017: LM: Distal 40% stenosis LAD: Ostial to mid 99-70% stenosis.  Diagonal 1 with mid 70% stenosis. LCx: Large, dominant vessel.  Ostial 80%, mid 60% stenosis.  OM 2 with proximal 60% stenosis. RCA: Nondominant vessel.  Mid diffuse 80% stenosis.  Recent labs: 07/27/2019: Cholesterol 205, triglycerides 154, HDL 57, LDL 123.  02/15/2019: Cholesterol 216, triglycerides 206, HDL 57, LDL 121.  03/15/2017:  Cholesterol 301, triglycerides 585, HDL 42, LDL 150.  05/05/2017: H/H 11.5/37.1.  MCV 27.  Triglycerides 436.  Review of Systems  Cardiovascular: Negative for chest pain, dyspnea on exertion, leg swelling, palpitations and syncope.       Vitals:   08/07/19 1144  BP: 121/62  Pulse: 73  Resp: 14  Temp: 98.8 F (37.1 C)  SpO2: 95%     Body mass index is 24.53 kg/m. Filed Weights   08/07/19 1144  Weight: 134 lb 1.6 oz (60.8 kg)     Objective:   Physical Exam  Constitutional: She  appears well-developed and well-nourished.  Neck: No JVD present.  Cardiovascular: Normal rate, regular rhythm, normal heart sounds and intact distal pulses.  No murmur heard. Pulmonary/Chest: Effort normal and breath sounds normal. She has no wheezes. She has no rales.  Musculoskeletal:        General: No edema.  Nursing note and vitals reviewed.         Assessment & Recommendations:   69 year old Caucasian female with uncontrolled diabetes, hyperlipidemia, history of basal cell carcinoma, severe multivessel coronary artery disease status post CABG 5 on 04/09/2017.  Diabetes melitus: Continue aggressive management as per PCP.  CAD with stable angina: Severe native vessel disease with patent grafts. Her pain is likely from small vessel CAD, and nondominant RCA, which is not amenable to revascularization.She has not tolerate Imdur and Ranexa. COntnue metoprolol succinate 25 mg daily. Started amlodipine 2.5 mg daily. Conitnue Aspirin. Discussion re: statin below.   Hyperlipidemia:  LDL remains suboptimal at 121.  She is currently still taking Lipitor 40 mg daily. LDL remains elevated at 121. She would like to try increased dose of lipior to 80 mg, along with diet changes, before initiating Repatha  F/u in 3 months after repeat lipid panel.   Nigel Mormon, MD [facility] [place] Pager: [phone] Office: [phone] If no answer Cell [phone]

## 2019-08-10 DIAGNOSIS — R87612 Low grade squamous intraepithelial lesion on cytologic smear of cervix (LGSIL): Secondary | ICD-10-CM | POA: Diagnosis not present

## 2019-08-10 DIAGNOSIS — Z6826 Body mass index (BMI) 26.0-26.9, adult: Secondary | ICD-10-CM | POA: Diagnosis not present

## 2019-08-10 DIAGNOSIS — Z779 Other contact with and (suspected) exposures hazardous to health: Secondary | ICD-10-CM | POA: Diagnosis not present

## 2019-08-17 DIAGNOSIS — H5213 Myopia, bilateral: Secondary | ICD-10-CM | POA: Diagnosis not present

## 2019-08-17 DIAGNOSIS — R87612 Low grade squamous intraepithelial lesion on cytologic smear of cervix (LGSIL): Secondary | ICD-10-CM | POA: Diagnosis not present

## 2019-08-17 DIAGNOSIS — R87610 Atypical squamous cells of undetermined significance on cytologic smear of cervix (ASC-US): Secondary | ICD-10-CM | POA: Diagnosis not present

## 2019-08-17 DIAGNOSIS — H524 Presbyopia: Secondary | ICD-10-CM | POA: Diagnosis not present

## 2019-08-17 DIAGNOSIS — E119 Type 2 diabetes mellitus without complications: Secondary | ICD-10-CM | POA: Diagnosis not present

## 2019-08-17 DIAGNOSIS — H25013 Cortical age-related cataract, bilateral: Secondary | ICD-10-CM | POA: Diagnosis not present

## 2019-08-17 DIAGNOSIS — H2513 Age-related nuclear cataract, bilateral: Secondary | ICD-10-CM | POA: Diagnosis not present

## 2019-08-17 DIAGNOSIS — H52203 Unspecified astigmatism, bilateral: Secondary | ICD-10-CM | POA: Diagnosis not present

## 2019-08-20 ENCOUNTER — Other Ambulatory Visit: Payer: Self-pay | Admitting: Cardiology

## 2019-08-20 DIAGNOSIS — I25708 Atherosclerosis of coronary artery bypass graft(s), unspecified, with other forms of angina pectoris: Secondary | ICD-10-CM

## 2019-09-08 DIAGNOSIS — K224 Dyskinesia of esophagus: Secondary | ICD-10-CM | POA: Diagnosis not present

## 2019-09-08 DIAGNOSIS — K3189 Other diseases of stomach and duodenum: Secondary | ICD-10-CM | POA: Diagnosis not present

## 2019-09-08 DIAGNOSIS — K219 Gastro-esophageal reflux disease without esophagitis: Secondary | ICD-10-CM | POA: Diagnosis not present

## 2019-09-08 DIAGNOSIS — K2970 Gastritis, unspecified, without bleeding: Secondary | ICD-10-CM | POA: Diagnosis not present

## 2019-09-08 DIAGNOSIS — B9681 Helicobacter pylori [H. pylori] as the cause of diseases classified elsewhere: Secondary | ICD-10-CM | POA: Diagnosis not present

## 2019-09-09 ENCOUNTER — Ambulatory Visit: Payer: Medicare HMO | Attending: Internal Medicine

## 2019-09-09 DIAGNOSIS — Z23 Encounter for immunization: Secondary | ICD-10-CM

## 2019-09-09 NOTE — Progress Notes (Signed)
   Covid-19 Vaccination Clinic  Name:  Christine [name]    MRN: [id number] DOB: 12/29/1950  09/09/2019  Christine [name] was observed post Covid-19 immunization for 15 minutes without incident. She was provided with Vaccine Information Sheet and instruction to access the V-Safe system.   Christine [name] was instructed to call 911 with any severe reactions post vaccine: Marland Kitchen Difficulty breathing  . Swelling of face and throat  . A fast heartbeat  . A bad rash all over body  . Dizziness and weakness   Immunizations Administered    Name Date Dose VIS Date Route   Pfizer COVID-19 Vaccine 09/09/2019  8:28 AM 0.3 mL 05/12/2019 Intramuscular   Manufacturer: [place]   Lot: [id number]   [place]: [id number]

## 2019-09-18 DIAGNOSIS — M2042 Other hammer toe(s) (acquired), left foot: Secondary | ICD-10-CM | POA: Diagnosis not present

## 2019-09-18 DIAGNOSIS — M2041 Other hammer toe(s) (acquired), right foot: Secondary | ICD-10-CM | POA: Diagnosis not present

## 2019-09-18 DIAGNOSIS — M79672 Pain in left foot: Secondary | ICD-10-CM | POA: Diagnosis not present

## 2019-09-18 DIAGNOSIS — M722 Plantar fascial fibromatosis: Secondary | ICD-10-CM | POA: Diagnosis not present

## 2019-09-18 DIAGNOSIS — M79671 Pain in right foot: Secondary | ICD-10-CM | POA: Diagnosis not present

## 2019-09-23 IMAGING — CR DG CHEST 2V
2 series · 2 of 2 positions shown · non-contrast
Comparison: CT chest 07/17/2010

CLINICAL DATA: Preoperative evaluation for CABG, chest pain,
shortness of breath, coronary artery disease, nonsmoker

EXAM:
CHEST  2 VIEW

[w chest pa]
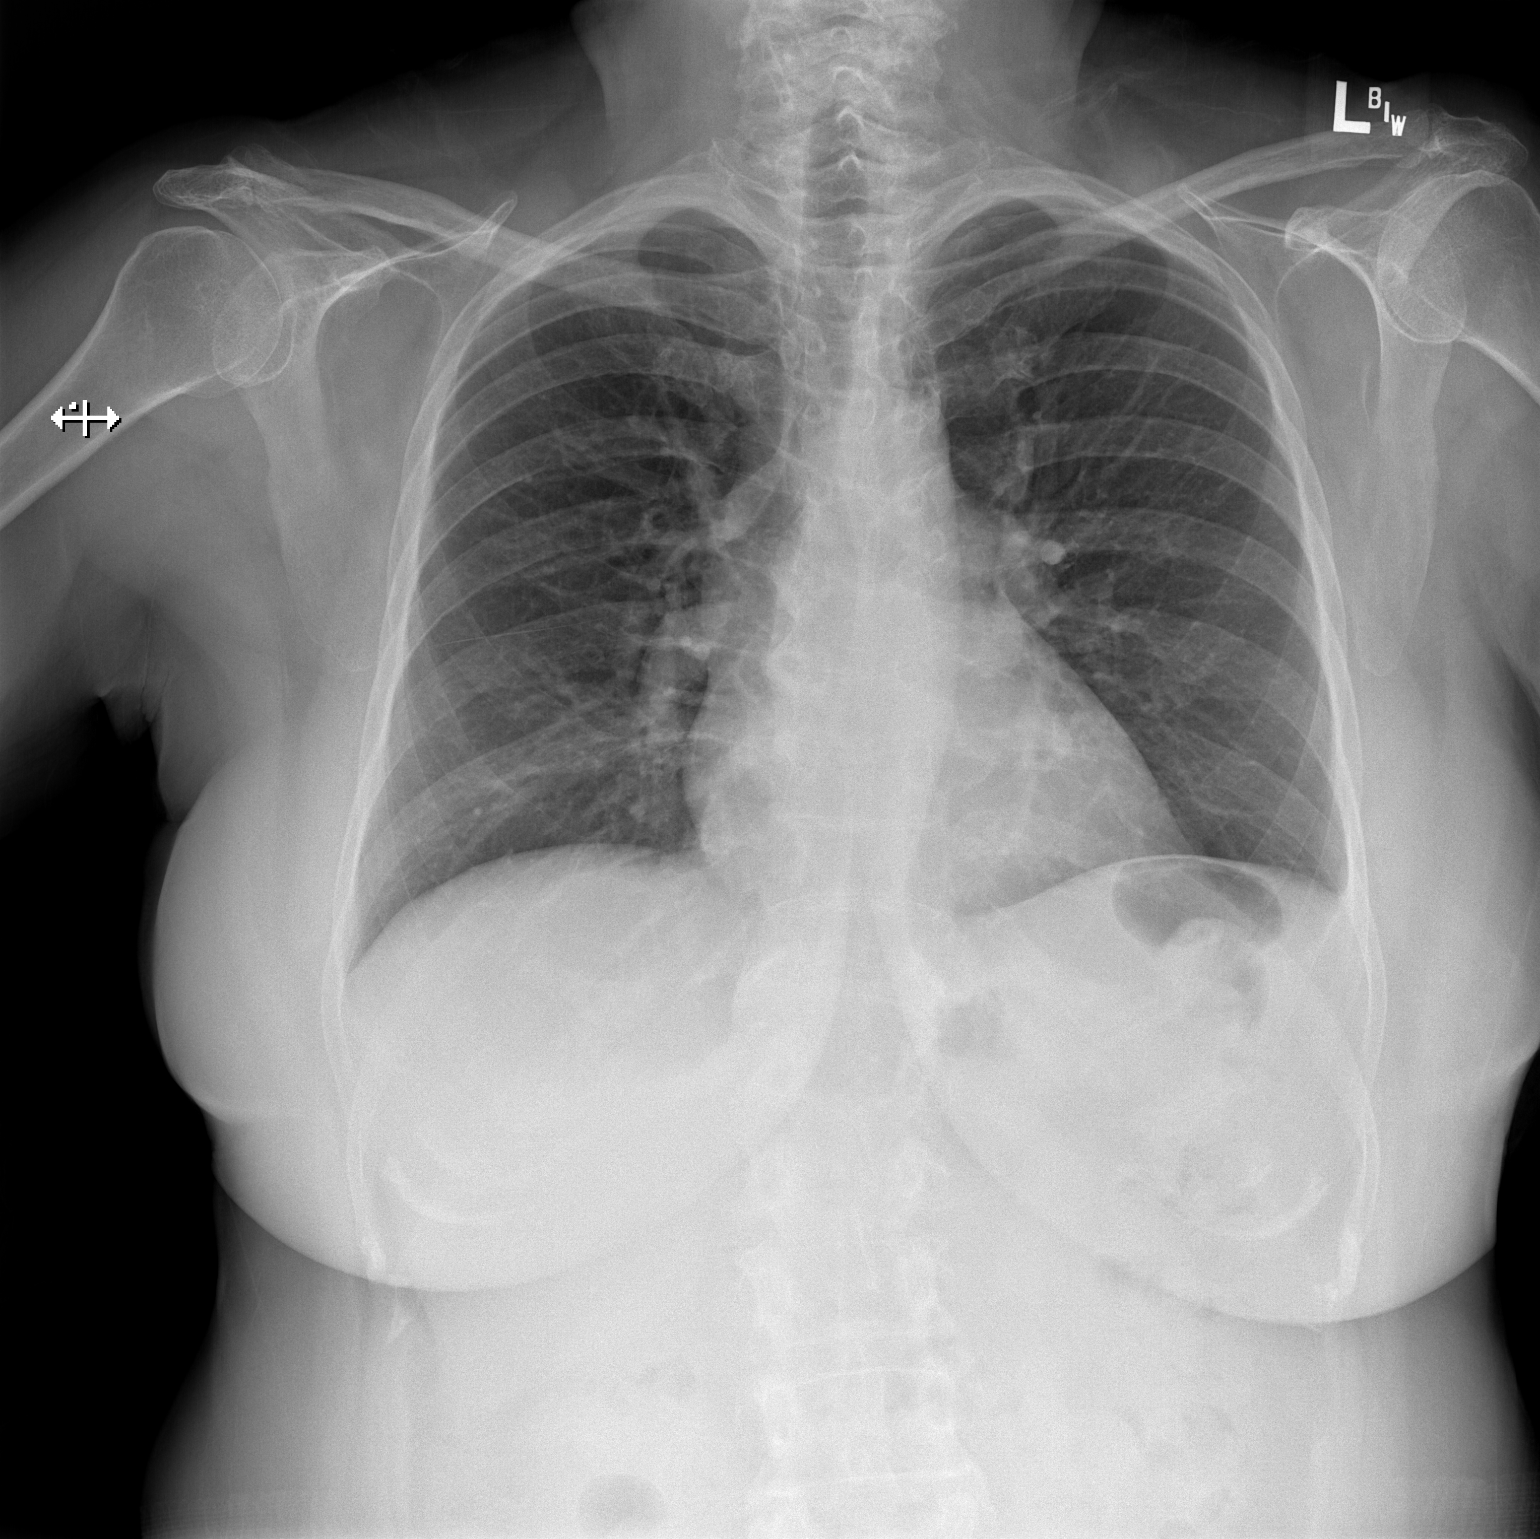

[w chest lat]
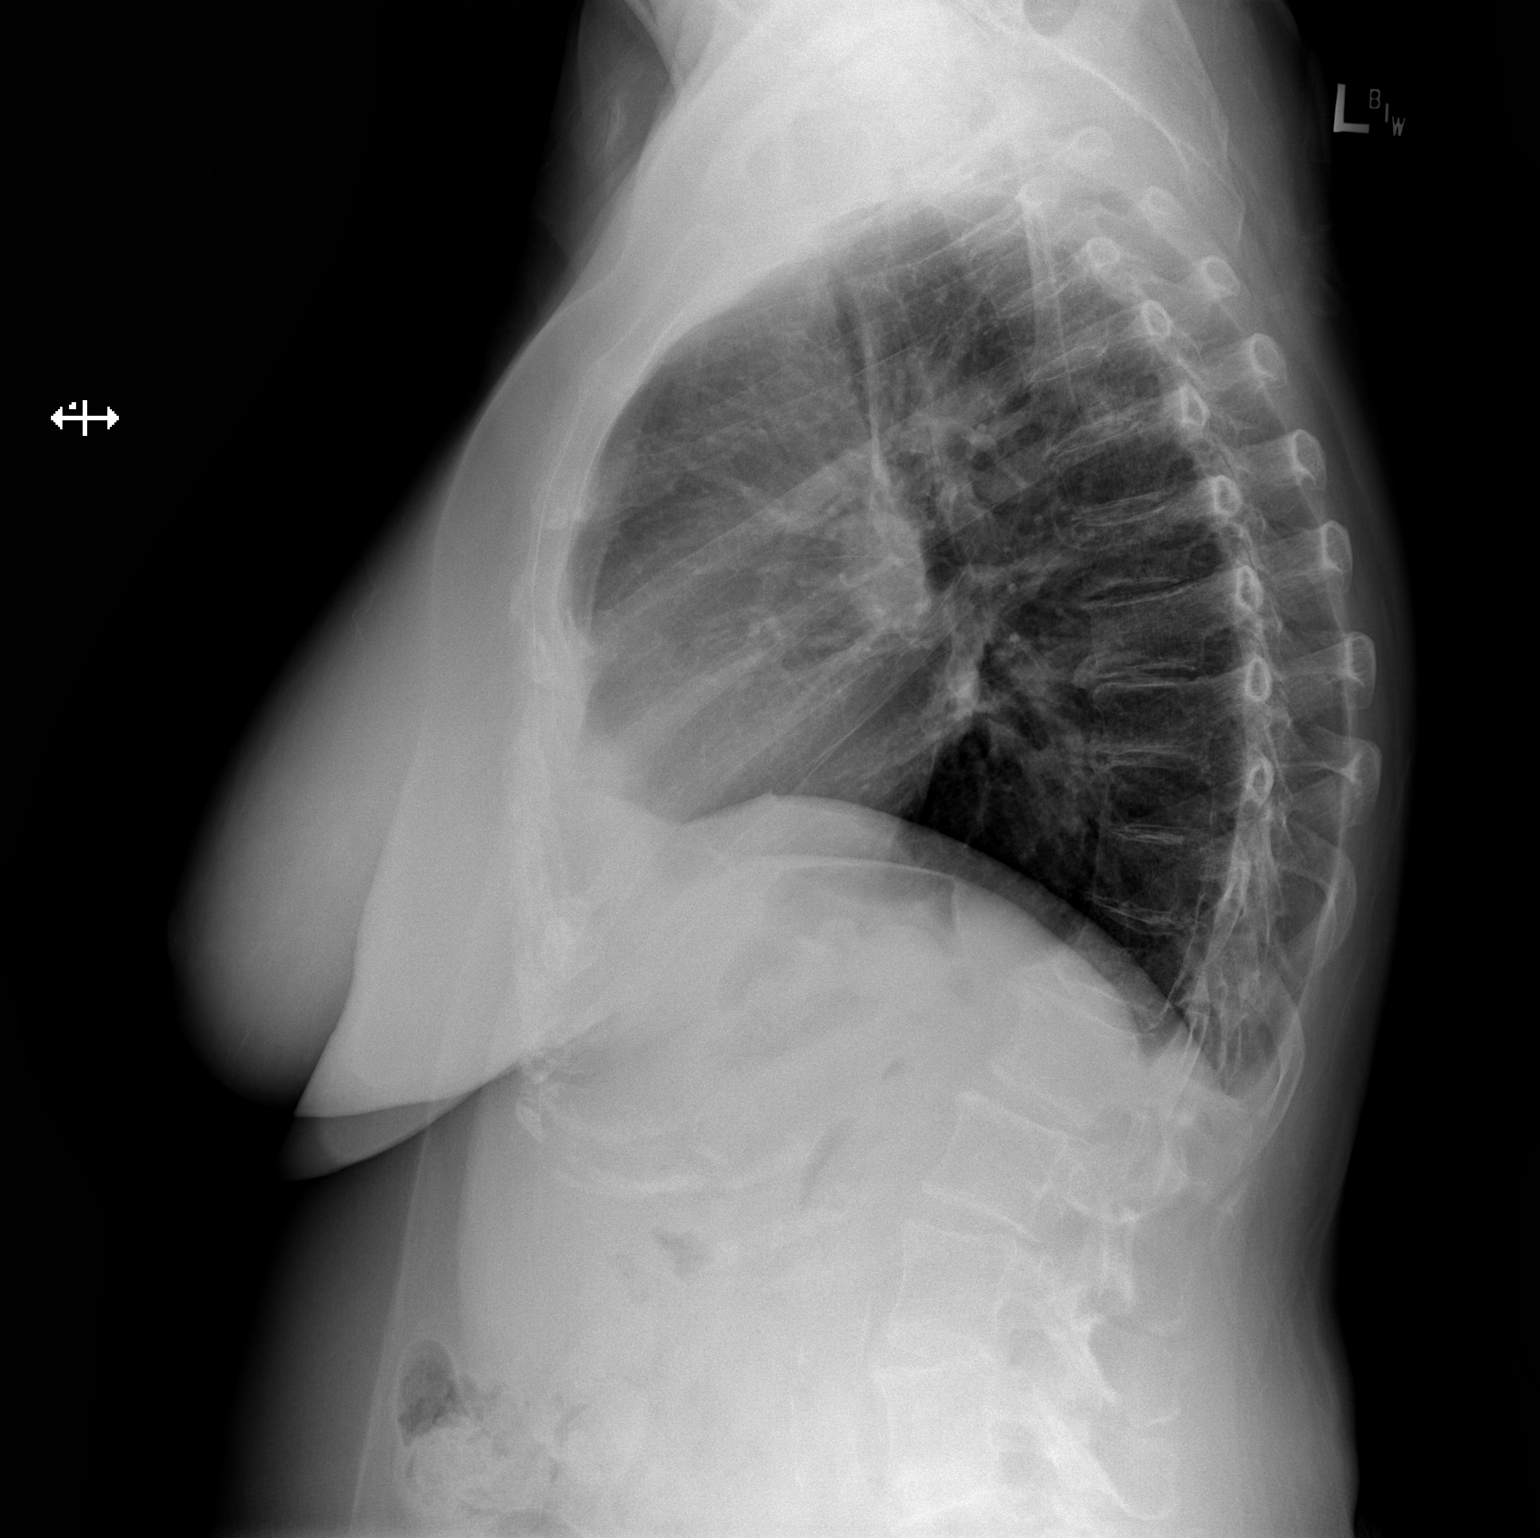

[2 of 2 positions shown; findings below may reference images not displayed]

FINDINGS: Normal heart size, mediastinal contours, and pulmonary vascularity.

Lungs clear.

No pleural effusion or pneumothorax.

Bones demineralized.
IMPRESSION: No acute abnormalities.

## 2019-09-25 IMAGING — DX DG CHEST 1V PORT
1 series · 1 of 1 positions shown · non-contrast
Comparison: April 07, 2017

CLINICAL DATA: Hypoxia

EXAM:
PORTABLE CHEST 1 VIEW

[chest]
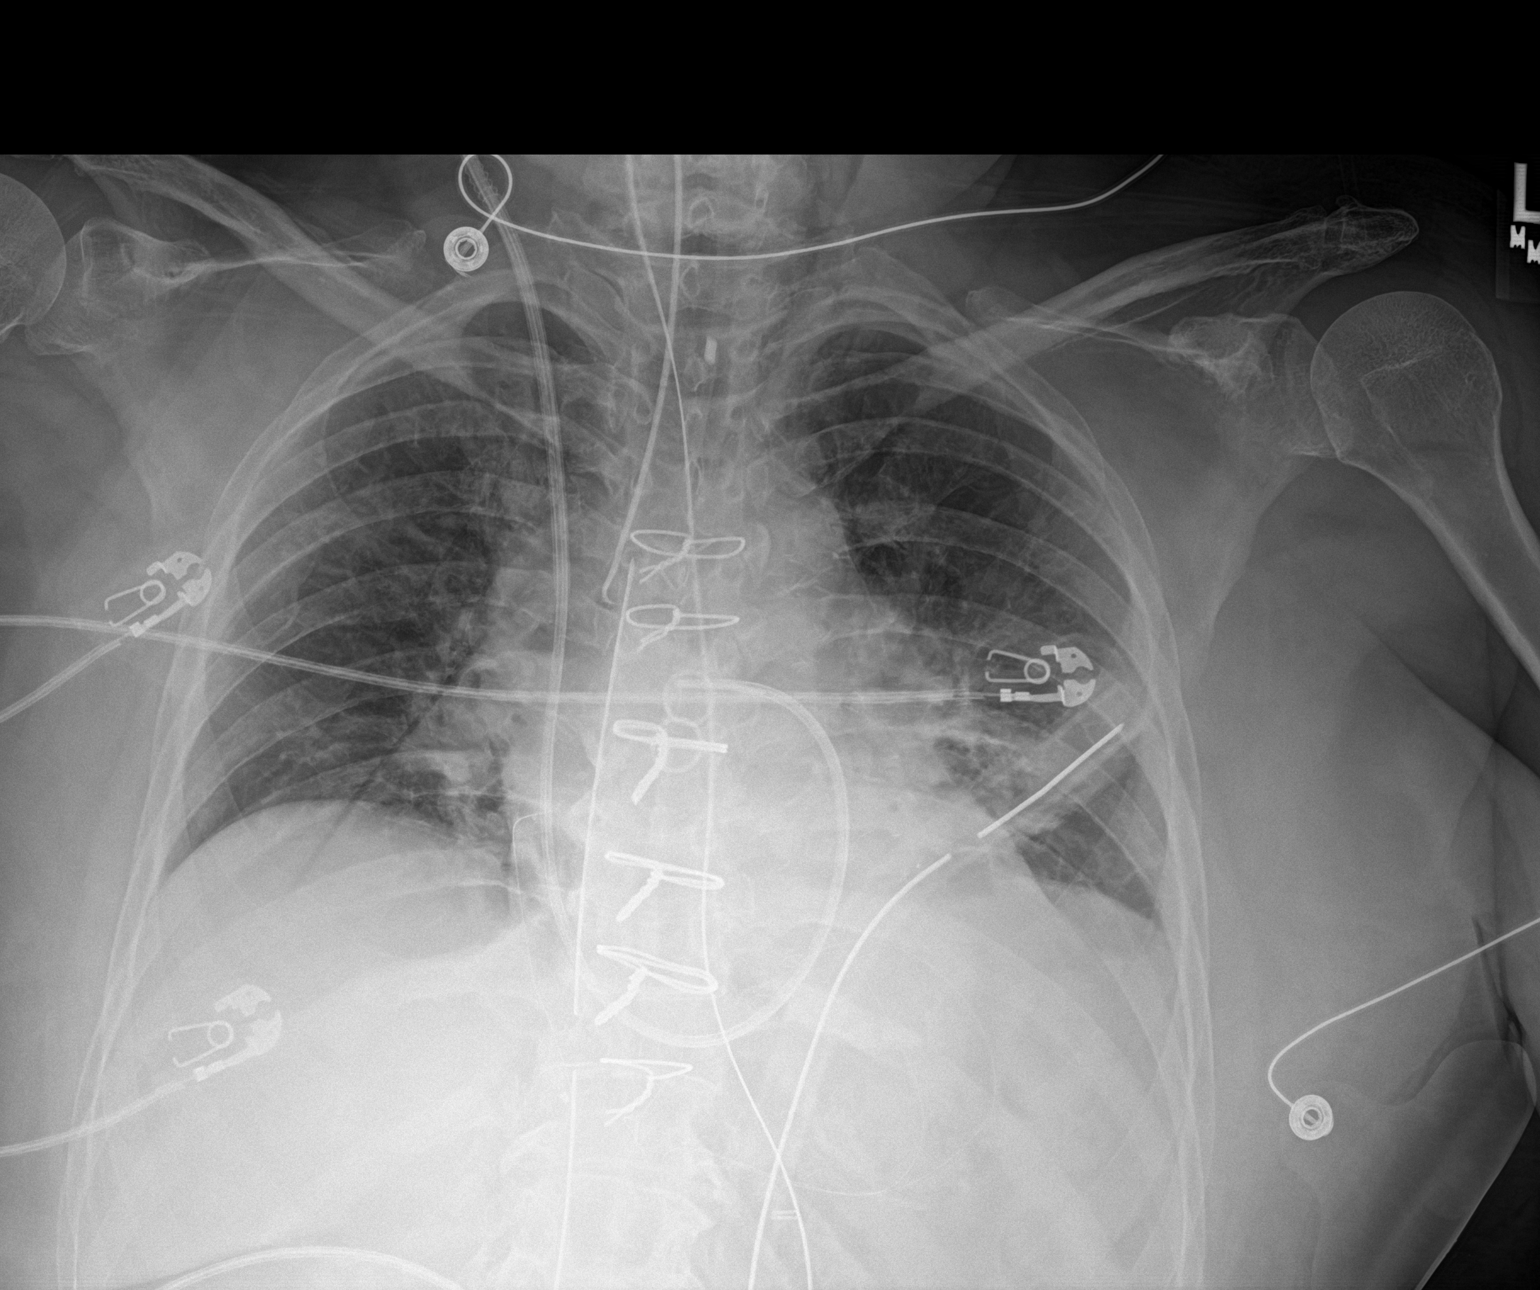

[1 of 1 positions shown; findings below may reference images not displayed]

FINDINGS: Endotracheal tube tip at carina. Swan-Ganz catheter tip is in the
right main pulmonary artery. Nasogastric tube tip and side port
below the diaphragm. There is a left chest tube and a mediastinal
drain. Pacemaker wires are attached to the right heart. No
pneumothorax.

There is pneumomediastinum with air tracking into the superior
mediastinum and upper neck region, more on the left than on the
right. There is patchy airspace consolidation in the left base.
Lungs elsewhere clear. Heart is upper normal in size with pulmonary
vascularity within normal limits. No evident adenopathy.
IMPRESSION: Endotracheal tube tip at carina. Advise withdrawing endotracheal
tube 2-3 cm. Other tubes and catheters as described.

There is pneumomediastinum tracking into the superior mediastinum
and neck region, more pronounced on the left than on the right. No
evident pneumothorax.

Patchy opacity left base concerning for pneumonia or possibly
aspiration. Lungs elsewhere clear.

## 2019-09-26 IMAGING — DX DG CHEST 1V PORT
1 series · 1 of 1 positions shown · non-contrast
Comparison: 04/09/2017

CLINICAL DATA: CABG

EXAM:
PORTABLE CHEST 1 VIEW

[chest]
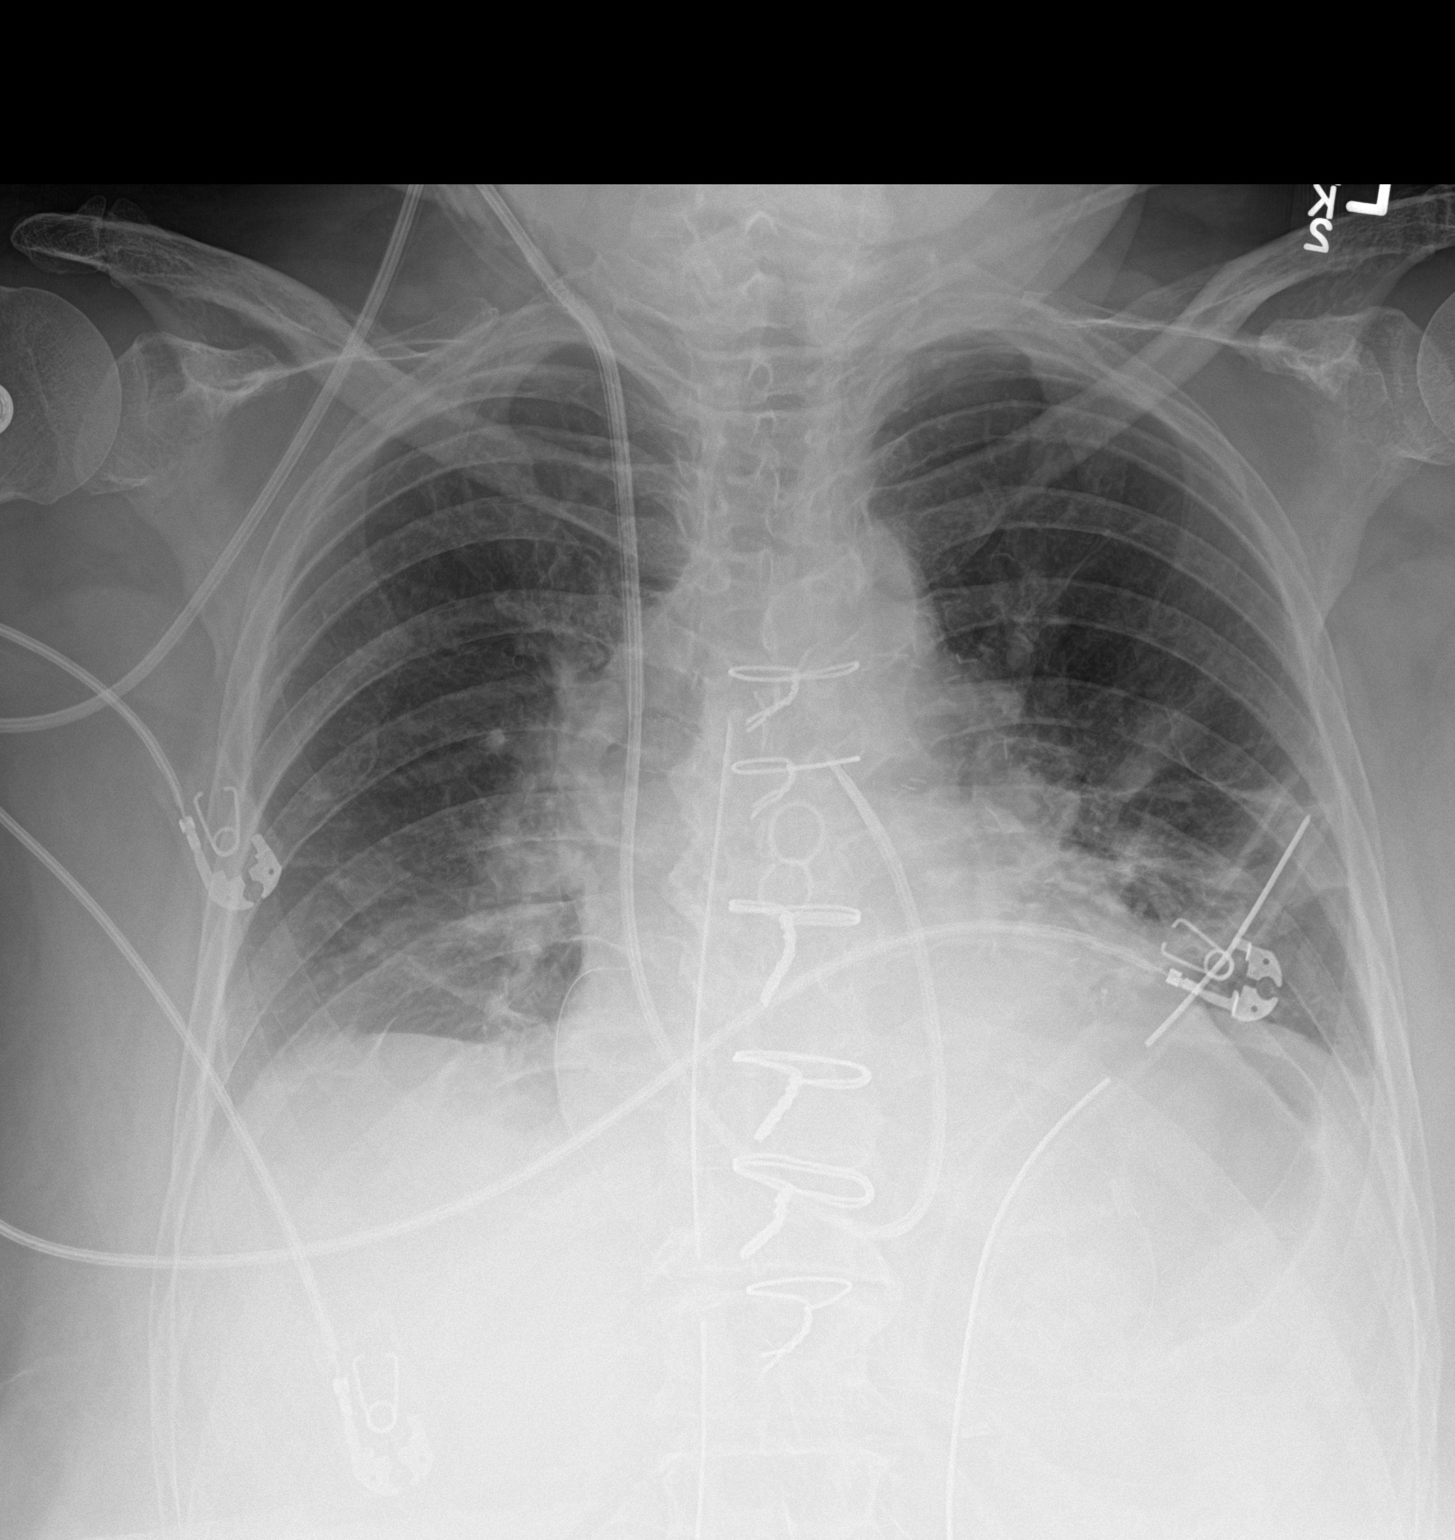

[1 of 1 positions shown; findings below may reference images not displayed]

FINDINGS: Endotracheal tube and NG tube removed. Swan-Ganz catheter tip in the
right main pulmonary artery. Mediastinal drain remains in place.
Left chest tube in place.

Negative for pneumothorax. Mild bibasilar atelectasis unchanged.
Negative for pulmonary edema.
IMPRESSION: Endotracheal tube removed. Bibasilar atelectasis unchanged. No
pneumothorax.

## 2019-10-04 ENCOUNTER — Ambulatory Visit: Payer: Medicare HMO | Attending: Internal Medicine

## 2019-10-04 DIAGNOSIS — Z23 Encounter for immunization: Secondary | ICD-10-CM

## 2019-10-04 NOTE — Progress Notes (Signed)
   Covid-19 Vaccination Clinic  Name:  Christine [name]    MRN: [id number] DOB: 02-05-1951  10/04/2019  Christine [name] was observed post Covid-19 immunization for 15 minutes without incident. She was provided with Vaccine Information Sheet and instruction to access the V-Safe system.   Christine [name] was instructed to call 911 with any severe reactions post vaccine: Marland Kitchen Difficulty breathing  . Swelling of face and throat  . A fast heartbeat  . A bad rash all over body  . Dizziness and weakness   Immunizations Administered    Name Date Dose VIS Date Route   Pfizer COVID-19 Vaccine 10/04/2019  9:08 AM 0.3 mL 07/26/2018 Intramuscular   Manufacturer: [place]   Lot: [id number]   [place]: [id number]

## 2019-10-27 ENCOUNTER — Other Ambulatory Visit: Payer: Self-pay | Admitting: Cardiology

## 2019-10-27 DIAGNOSIS — I25708 Atherosclerosis of coronary artery bypass graft(s), unspecified, with other forms of angina pectoris: Secondary | ICD-10-CM

## 2019-10-27 DIAGNOSIS — E782 Mixed hyperlipidemia: Secondary | ICD-10-CM

## 2019-11-13 DIAGNOSIS — K2970 Gastritis, unspecified, without bleeding: Secondary | ICD-10-CM | POA: Diagnosis not present

## 2019-11-13 DIAGNOSIS — B9681 Helicobacter pylori [H. pylori] as the cause of diseases classified elsewhere: Secondary | ICD-10-CM | POA: Diagnosis not present

## 2019-11-13 DIAGNOSIS — E1165 Type 2 diabetes mellitus with hyperglycemia: Secondary | ICD-10-CM | POA: Diagnosis not present

## 2019-11-13 DIAGNOSIS — E7800 Pure hypercholesterolemia, unspecified: Secondary | ICD-10-CM | POA: Diagnosis not present

## 2019-11-17 ENCOUNTER — Ambulatory Visit: Payer: Medicare HMO | Admitting: Cardiology

## 2019-11-22 DIAGNOSIS — I2510 Atherosclerotic heart disease of native coronary artery without angina pectoris: Secondary | ICD-10-CM | POA: Diagnosis not present

## 2019-11-22 DIAGNOSIS — E7800 Pure hypercholesterolemia, unspecified: Secondary | ICD-10-CM | POA: Diagnosis not present

## 2019-11-22 DIAGNOSIS — E1165 Type 2 diabetes mellitus with hyperglycemia: Secondary | ICD-10-CM | POA: Diagnosis not present

## 2019-11-24 ENCOUNTER — Other Ambulatory Visit: Payer: Self-pay | Admitting: Cardiology

## 2019-12-13 ENCOUNTER — Encounter: Payer: Self-pay | Admitting: Cardiology

## 2019-12-13 ENCOUNTER — Other Ambulatory Visit: Payer: Self-pay

## 2019-12-13 ENCOUNTER — Ambulatory Visit: Payer: Medicare HMO | Admitting: Cardiology

## 2019-12-13 VITALS — BP 116/60 | HR 59 | Wt 136.0 lb

## 2019-12-13 DIAGNOSIS — I25708 Atherosclerosis of coronary artery bypass graft(s), unspecified, with other forms of angina pectoris: Secondary | ICD-10-CM

## 2019-12-13 DIAGNOSIS — E782 Mixed hyperlipidemia: Secondary | ICD-10-CM

## 2019-12-13 DIAGNOSIS — E1165 Type 2 diabetes mellitus with hyperglycemia: Secondary | ICD-10-CM

## 2019-12-13 NOTE — Progress Notes (Signed)
Follow up visit  Subjective:   Christine [name], female    DOB: [birth year], 69 y.o.   MRN: [id number]   Chief Complaint  Patient presents with  . Hyperlipidemia    HPI  69 year old Caucasian female with uncontrolled diabetes, hyperlipidemia, CAD CABG 5 (LIMA - LAD, SVG-DIAG 1A - DIAG 1B, SVG-OM1 - OM2 04/2017), h/o basal cell carcinoma  Patient recently had lipid panel checked with PCP. I have partial results, as below.   11/13/2019: Cholesterol 162, triglycerides 111, HDL 53, LDL ?  Patient tells me that PCP told her her LDL was not down to at goal (<70 given her known CAD).  She reports her A1C is down 7.3%.   Current Outpatient Medications on File Prior to Visit  Medication Sig Dispense Refill  . amLODipine (NORVASC) 2.5 MG tablet Take 2 tablets by mouth once daily 180 tablet 0  . aspirin EC 81 MG tablet Take 81 mg by mouth every evening.     Marland Kitchen atorvastatin (LIPITOR) 40 MG tablet Take 1 tablet by mouth once daily 90 tablet 0  . metoprolol tartrate (LOPRESSOR) 25 MG tablet Take 1 tablet by mouth twice daily 180 tablet 0  . Multiple Vitamins-Minerals (MULTIVITAMIN WITH MINERALS) tablet Take 1 tablet by mouth 4 (four) times a week.     . nitroGLYCERIN (NITROSTAT) 0.4 MG SL tablet Place 1 tablet (0.4 mg total) under the tongue every 5 (five) minutes as needed for chest pain. 90 tablet 3  . pantoprazole (PROTONIX) 40 MG tablet Take 40 mg by mouth daily.     . RYBELSUS 7 MG TABS Take 1 tablet by mouth every morning.     No current facility-administered medications on file prior to visit.    Cardiovascular studies:  Coronary/bypass graft angiography 04/25/2019: LM: Normal LAD: Severe proximal calcific disease, followed by 10% occlusion LCx: Severe calcific ostial disease, followed by severe mid vessel disease RCA: Nondominant small vessel with severe diffuse disease  LIMA-LAD: Patent with excellent distal runoff SVG-OM1, OM2: Patent with excellent distal  runoff SVG-Diag1, diag2:  Patent with excellent distal runoff  LVEDP 1 mmHg  Severe native vessel CAD Patent grafts (LIMA-LAD, SVG-Diag seq graft, SVG-OM, seq graft)  Chest pain could be due to residual native vessel CAD, microvascular CAD. Continue medical management. Consider repeat cardiac rehab.   EKG 04/13/2019: Probable sinus rhythm 66 bpm.  Left axis deviation.  Exercise myoview stress 01/28/2018:  1. The patient performed treadmill exercise using Bruce protocol, completing 6:13 minutes. The patient completed an estimated workload of 7.3 METS, reaching of the maximum predicted heart rate. Low exercise capacity for age. Normal hemodynamic response. Patient developed exercise induced chest pain that relived with rest. The stress electrocardiogram showed sinus tachycardia, normal stress conduction and no stress arrhythmias. Stress EKG sowed isolated T wave inversion in lead aVL that persisted 4 min into recovery. Stress EKG is equivocal for ischemia.  2. The overall quality of the study is good.  Left ventricular cavity is noted to be normal on the rest and stress studies.  Gated SPECT images reveal normal myocardial thickening and wall motion.  The left ventricular ejection fraction was calculated or visually estimated to be 68% Small area of basal inferoseptal perfusion defect with no significant reversibility, likely due to breast attenuation artifact, with imaging performed in sitting position. .   3. Low risk SPECT imaging study. Recommend clinical correlation given patient's exercise induce chest pain and localized EKG changes, as described.  Lower extremity arterial duplex 06/02/2017:  No hemodynamically significant stenoses are identified in the right lower extremity arterial system. No hemodynamically significant stenoses are identified in the left lower extremity arterial system. This exam reveals normal perfusion of the right lower extremity RABI 0.98 and mildly decreased  perfusion of the left lower extremity, noted at the post tibial artery level ABI 0.94. However there was triphasic (normal waveform) noted at the level of the ankle.  Clinical correlation recommended.  CABG 04/09/2017 (Dr. Roxan Hockey): LIMA - LAD SVG-DIAG 1A - DIAG 1B SVG-OM1 - OM2  Coronary angiography 04/01/2017: LM: Distal 40% stenosis LAD: Ostial to mid 99-70% stenosis.  Diagonal 1 with mid 70% stenosis. LCx: Large, dominant vessel.  Ostial 80%, mid 60% stenosis.  OM 2 with proximal 60% stenosis. RCA: Nondominant vessel.  Mid diffuse 80% stenosis.  Recent labs: 11/13/2019: Cholesterol 162, triglycerides 111, HDL 53, LDL ?  07/27/2019: Cholesterol 205, triglycerides 154, HDL 57, LDL 123.  02/15/2019: Cholesterol 216, triglycerides 206, HDL 57, LDL 121.  03/15/2017:  Cholesterol 301, triglycerides 585, HDL 42, LDL 150.  05/05/2017: H/H 11.5/37.1.  MCV 27.  Triglycerides 436.  Review of Systems  Cardiovascular: Negative for chest pain, dyspnea on exertion, leg swelling, palpitations and syncope.       Vitals:   12/13/19 1631  BP: 116/60  Pulse: (!) 59  SpO2: 97%      Objective:   Physical Exam Vitals and nursing note reviewed.  Constitutional:      Appearance: She is well-developed.  Neck:     Vascular: No JVD.  Cardiovascular:     Rate and Rhythm: Normal rate and regular rhythm.     Pulses: Intact distal pulses.     Heart sounds: Normal heart sounds. No murmur heard.   Pulmonary:     Effort: Pulmonary effort is normal.     Breath sounds: Normal breath sounds. No wheezing or rales.           Assessment & Recommendations:   69 year old Caucasian female with uncontrolled diabetes, hyperlipidemia, CAD CABG 5 (LIMA - LAD, SVG-DIAG 1A - DIAG 1B, SVG-OM1 - OM2 04/2017), h/o basal cell carcinoma  Diabetes melitus: Continue aggressive management as per PCP. Consider adding Jardiance 10 mg daily for its CV benefits.  CAD with stable angina: Severe native  vessel disease with patent grafts. Continue current anti-anginal therapy.  Hyperlipidemia:  LDL value not know to me, but likely >70. Discussed starting Repatha. She would like to think about it.   Repeat lipid panel in 05/2019, f/u after that.   Nigel Mormon, MD [facility] Pager: [phone] Office: [phone] If no answer Cell [phone]

## 2020-01-15 ENCOUNTER — Other Ambulatory Visit: Payer: Self-pay | Admitting: Cardiology

## 2020-01-15 DIAGNOSIS — I25708 Atherosclerosis of coronary artery bypass graft(s), unspecified, with other forms of angina pectoris: Secondary | ICD-10-CM

## 2020-02-07 ENCOUNTER — Other Ambulatory Visit: Payer: Self-pay | Admitting: Cardiology

## 2020-02-07 DIAGNOSIS — E782 Mixed hyperlipidemia: Secondary | ICD-10-CM

## 2020-02-07 DIAGNOSIS — I25708 Atherosclerosis of coronary artery bypass graft(s), unspecified, with other forms of angina pectoris: Secondary | ICD-10-CM

## 2020-03-18 ENCOUNTER — Other Ambulatory Visit: Payer: Self-pay | Admitting: Cardiology

## 2020-03-18 DIAGNOSIS — R1310 Dysphagia, unspecified: Secondary | ICD-10-CM | POA: Diagnosis not present

## 2020-03-18 DIAGNOSIS — K2950 Unspecified chronic gastritis without bleeding: Secondary | ICD-10-CM | POA: Diagnosis not present

## 2020-03-18 DIAGNOSIS — E119 Type 2 diabetes mellitus without complications: Secondary | ICD-10-CM | POA: Diagnosis not present

## 2020-03-18 DIAGNOSIS — Z8619 Personal history of other infectious and parasitic diseases: Secondary | ICD-10-CM | POA: Diagnosis not present

## 2020-03-18 DIAGNOSIS — Z8719 Personal history of other diseases of the digestive system: Secondary | ICD-10-CM | POA: Diagnosis not present

## 2020-04-18 DIAGNOSIS — M722 Plantar fascial fibromatosis: Secondary | ICD-10-CM | POA: Diagnosis not present

## 2020-05-13 DIAGNOSIS — M71572 Other bursitis, not elsewhere classified, left ankle and foot: Secondary | ICD-10-CM | POA: Diagnosis not present

## 2020-05-13 DIAGNOSIS — M722 Plantar fascial fibromatosis: Secondary | ICD-10-CM | POA: Diagnosis not present

## 2020-05-13 DIAGNOSIS — M71571 Other bursitis, not elsewhere classified, right ankle and foot: Secondary | ICD-10-CM | POA: Diagnosis not present

## 2020-05-23 DIAGNOSIS — Z1231 Encounter for screening mammogram for malignant neoplasm of breast: Secondary | ICD-10-CM | POA: Diagnosis not present

## 2020-05-28 ENCOUNTER — Other Ambulatory Visit: Payer: Self-pay | Admitting: Cardiology

## 2020-06-03 DIAGNOSIS — H25813 Combined forms of age-related cataract, bilateral: Secondary | ICD-10-CM | POA: Diagnosis not present

## 2020-06-03 DIAGNOSIS — H5213 Myopia, bilateral: Secondary | ICD-10-CM | POA: Diagnosis not present

## 2020-06-03 DIAGNOSIS — E119 Type 2 diabetes mellitus without complications: Secondary | ICD-10-CM | POA: Diagnosis not present

## 2020-06-03 DIAGNOSIS — H524 Presbyopia: Secondary | ICD-10-CM | POA: Diagnosis not present

## 2020-06-03 DIAGNOSIS — H52203 Unspecified astigmatism, bilateral: Secondary | ICD-10-CM | POA: Diagnosis not present

## 2020-06-03 DIAGNOSIS — J029 Acute pharyngitis, unspecified: Secondary | ICD-10-CM | POA: Diagnosis not present

## 2020-06-04 DIAGNOSIS — Z779 Other contact with and (suspected) exposures hazardous to health: Secondary | ICD-10-CM | POA: Diagnosis not present

## 2020-06-04 DIAGNOSIS — Z6825 Body mass index (BMI) 25.0-25.9, adult: Secondary | ICD-10-CM | POA: Diagnosis not present

## 2020-06-10 DIAGNOSIS — Z779 Other contact with and (suspected) exposures hazardous to health: Secondary | ICD-10-CM | POA: Diagnosis not present

## 2020-06-14 ENCOUNTER — Ambulatory Visit: Payer: Medicare HMO | Admitting: Cardiology

## 2020-07-03 DIAGNOSIS — H25011 Cortical age-related cataract, right eye: Secondary | ICD-10-CM | POA: Diagnosis not present

## 2020-07-03 DIAGNOSIS — H2511 Age-related nuclear cataract, right eye: Secondary | ICD-10-CM | POA: Diagnosis not present

## 2020-07-03 DIAGNOSIS — Z48810 Encounter for surgical aftercare following surgery on the sense organs: Secondary | ICD-10-CM | POA: Diagnosis not present

## 2020-07-03 DIAGNOSIS — H25812 Combined forms of age-related cataract, left eye: Secondary | ICD-10-CM | POA: Diagnosis not present

## 2020-07-03 DIAGNOSIS — Z9841 Cataract extraction status, right eye: Secondary | ICD-10-CM | POA: Diagnosis not present

## 2020-07-03 DIAGNOSIS — H524 Presbyopia: Secondary | ICD-10-CM | POA: Diagnosis not present

## 2020-07-03 DIAGNOSIS — Z961 Presence of intraocular lens: Secondary | ICD-10-CM | POA: Diagnosis not present

## 2020-07-03 HISTORY — PX: CATARACT EXTRACTION: SUR2

## 2020-07-08 ENCOUNTER — Ambulatory Visit: Payer: Medicare HMO | Admitting: Cardiology

## 2020-07-11 ENCOUNTER — Ambulatory Visit: Payer: Medicare HMO | Admitting: Cardiology

## 2020-07-11 ENCOUNTER — Other Ambulatory Visit: Payer: Self-pay

## 2020-07-11 ENCOUNTER — Encounter: Payer: Self-pay | Admitting: Cardiology

## 2020-07-11 VITALS — BP 112/72 | HR 65 | Temp 98.6°F | Resp 17 | Ht 62.0 in | Wt 131.8 lb

## 2020-07-11 DIAGNOSIS — E782 Mixed hyperlipidemia: Secondary | ICD-10-CM | POA: Diagnosis not present

## 2020-07-11 DIAGNOSIS — I25708 Atherosclerosis of coronary artery bypass graft(s), unspecified, with other forms of angina pectoris: Secondary | ICD-10-CM | POA: Diagnosis not present

## 2020-07-11 DIAGNOSIS — E1165 Type 2 diabetes mellitus with hyperglycemia: Secondary | ICD-10-CM | POA: Diagnosis not present

## 2020-07-11 NOTE — Progress Notes (Signed)
Follow up visit  Subjective:   Christine [name], female    DOB: Jul 15, 1950, 70 y.o.   MRN: [id number]   Chief Complaint  Patient presents with  . Follow-up    6 month    HPI  70 year old Caucasian female with uncontrolled diabetes, hyperlipidemia, CAD CABG 5 (LIMA - LAD, SVG-DIAG 1A - DIAG 1B, SVG-OM1 - OM2 04/2017), h/o basal cell carcinoma  Patient denies chest pain, shortness of breath, palpitations, leg edema, orthopnea, PND, TIA/syncope. Recent lipid panel reviewed with the patient, details below. She reports her A1C is down 6.9%, but worries that it may be up again, as she had been on prednisone for ?inflammation.   Current Outpatient Medications on File Prior to Visit  Medication Sig Dispense Refill  . amLODipine (NORVASC) 2.5 MG tablet Take 2 tablets by mouth once daily 180 tablet 0  . aspirin EC 81 MG tablet Take 81 mg by mouth every evening.     Marland Kitchen atorvastatin (LIPITOR) 40 MG tablet Take 1 tablet by mouth once daily 90 tablet 1  . metoprolol tartrate (LOPRESSOR) 25 MG tablet Take 1 tablet by mouth twice daily 180 tablet 0  . Multiple Vitamins-Minerals (MULTIVITAMIN WITH MINERALS) tablet Take 1 tablet by mouth 4 (four) times a week.     . nitroGLYCERIN (NITROSTAT) 0.4 MG SL tablet Place 1 tablet (0.4 mg total) under the tongue every 5 (five) minutes as needed for chest pain. 90 tablet 3  . pantoprazole (PROTONIX) 40 MG tablet Take 40 mg by mouth daily.     . RYBELSUS 7 MG TABS Take 1 tablet by mouth every morning.     No current facility-administered medications on file prior to visit.    Cardiovascular studies:  EKG 07/11/2020: Probable sinus rhythm 61 bpm Cannot exclude old anteroseptal infarct  Coronary/bypass graft angiography 04/25/2019: LM: Normal LAD: Severe proximal calcific disease, followed by 10% occlusion LCx: Severe calcific ostial disease, followed by severe mid vessel disease RCA: Nondominant small vessel with severe diffuse disease  LIMA-LAD:  Patent with excellent distal runoff SVG-OM1, OM2: Patent with excellent distal runoff SVG-Diag1, diag2:  Patent with excellent distal runoff  LVEDP 1 mmHg  Severe native vessel CAD Patent grafts (LIMA-LAD, SVG-Diag seq graft, SVG-OM, seq graft)  Lower extremity arterial duplex 06/02/2017: No hemodynamically significant stenoses are identified in the right lower extremity arterial system. No hemodynamically significant stenoses are identified in the left lower extremity arterial system. This exam reveals normal perfusion of the right lower extremity RABI 0.98 and mildly decreased perfusion of the left lower extremity, noted at the post tibial artery level ABI 0.94. However there was triphasic (normal waveform) noted at the level of the ankle.  Clinical correlation recommended.  CABG 04/09/2017 (Dr. Roxan Hockey): LIMA - LAD SVG-DIAG 1A - DIAG 1B SVG-OM1 - OM2  Coronary angiography 04/01/2017: LM: Distal 40% stenosis LAD: Ostial to mid 99-70% stenosis.  Diagonal 1 with mid 70% stenosis. LCx: Large, dominant vessel.  Ostial 80%, mid 60% stenosis.  OM 2 with proximal 60% stenosis. RCA: Nondominant vessel.  Mid diffuse 80% stenosis.  Recent labs: 07/26/2020: Cholesterol 205, triglycerides 154, HDL 57, LDL 121  07/27/2019: Cholesterol 205, triglycerides 154, HDL 57, LDL 123.  02/15/2019: Cholesterol 216, triglycerides 206, HDL 57, LDL 121.  03/15/2017:  Cholesterol 301, triglycerides 585, HDL 42, LDL 150.  05/05/2017: H/H 11.5/37.1.  MCV 27.  Triglycerides 436.  Review of Systems  Cardiovascular: Negative for chest pain, dyspnea on exertion, leg swelling, palpitations and syncope.  Vitals:   07/11/20 1035  BP: 112/72  Pulse: 65  Resp: 17  Temp: 98.6 F (37 C)  SpO2: 98%      Objective:   Physical Exam Vitals and nursing note reviewed.  Constitutional:      Appearance: She is well-developed.  Neck:     Vascular: No JVD.  Cardiovascular:     Rate and Rhythm:  Normal rate and regular rhythm.     Pulses: Intact distal pulses.     Heart sounds: Normal heart sounds. No murmur heard.   Pulmonary:     Effort: Pulmonary effort is normal.     Breath sounds: Normal breath sounds. No wheezing or rales.           Assessment & Recommendations:   70 year old Caucasian female with uncontrolled diabetes, hyperlipidemia, CAD CABG 5 (LIMA - LAD, SVG-DIAG 1A - DIAG 1B, SVG-OM1 - OM2 04/2017), h/o basal cell carcinoma  Diabetes melitus: Continue aggressive management as per PCP. Consider adding Jardiance 10 mg daily for its CV benefits.  CAD with stable angina: Severe native vessel disease with patent grafts. Continue current anti-anginal therapy.  Hyperlipidemia:  LDL 121 Discussed starting Repatha, as in previous visit. Patient does not want to add any medications at this time.  F/u lipid panel and office visit in 1 year  Nigel Mormon, MD [facility] Pager: [phone] Office: [phone] If no answer Cell [phone]

## 2020-07-17 DIAGNOSIS — H2512 Age-related nuclear cataract, left eye: Secondary | ICD-10-CM | POA: Diagnosis not present

## 2020-08-05 ENCOUNTER — Other Ambulatory Visit: Payer: Self-pay | Admitting: Cardiology

## 2020-08-05 DIAGNOSIS — I25708 Atherosclerosis of coronary artery bypass graft(s), unspecified, with other forms of angina pectoris: Secondary | ICD-10-CM

## 2020-08-13 DIAGNOSIS — E559 Vitamin D deficiency, unspecified: Secondary | ICD-10-CM | POA: Diagnosis not present

## 2020-08-13 DIAGNOSIS — Z0000 Encounter for general adult medical examination without abnormal findings: Secondary | ICD-10-CM | POA: Diagnosis not present

## 2020-08-13 DIAGNOSIS — E7800 Pure hypercholesterolemia, unspecified: Secondary | ICD-10-CM | POA: Diagnosis not present

## 2020-08-13 DIAGNOSIS — E119 Type 2 diabetes mellitus without complications: Secondary | ICD-10-CM | POA: Diagnosis not present

## 2020-08-20 DIAGNOSIS — E559 Vitamin D deficiency, unspecified: Secondary | ICD-10-CM | POA: Diagnosis not present

## 2020-08-20 DIAGNOSIS — R748 Abnormal levels of other serum enzymes: Secondary | ICD-10-CM | POA: Diagnosis not present

## 2020-08-20 DIAGNOSIS — E1165 Type 2 diabetes mellitus with hyperglycemia: Secondary | ICD-10-CM | POA: Diagnosis not present

## 2020-08-20 DIAGNOSIS — B370 Candidal stomatitis: Secondary | ICD-10-CM | POA: Diagnosis not present

## 2020-08-20 DIAGNOSIS — I2510 Atherosclerotic heart disease of native coronary artery without angina pectoris: Secondary | ICD-10-CM | POA: Diagnosis not present

## 2020-08-20 DIAGNOSIS — Z0000 Encounter for general adult medical examination without abnormal findings: Secondary | ICD-10-CM | POA: Diagnosis not present

## 2020-08-20 DIAGNOSIS — E7800 Pure hypercholesterolemia, unspecified: Secondary | ICD-10-CM | POA: Diagnosis not present

## 2020-08-22 ENCOUNTER — Other Ambulatory Visit: Payer: Self-pay | Admitting: Internal Medicine

## 2020-08-22 DIAGNOSIS — R748 Abnormal levels of other serum enzymes: Secondary | ICD-10-CM

## 2020-08-29 DIAGNOSIS — H02413 Mechanical ptosis of bilateral eyelids: Secondary | ICD-10-CM | POA: Diagnosis not present

## 2020-08-29 DIAGNOSIS — H02831 Dermatochalasis of right upper eyelid: Secondary | ICD-10-CM | POA: Diagnosis not present

## 2020-08-29 DIAGNOSIS — H02423 Myogenic ptosis of bilateral eyelids: Secondary | ICD-10-CM | POA: Diagnosis not present

## 2020-08-29 DIAGNOSIS — H57813 Brow ptosis, bilateral: Secondary | ICD-10-CM | POA: Diagnosis not present

## 2020-08-29 DIAGNOSIS — H02834 Dermatochalasis of left upper eyelid: Secondary | ICD-10-CM | POA: Diagnosis not present

## 2020-08-29 DIAGNOSIS — H0279 Other degenerative disorders of eyelid and periocular area: Secondary | ICD-10-CM | POA: Diagnosis not present

## 2020-08-29 DIAGNOSIS — H53483 Generalized contraction of visual field, bilateral: Secondary | ICD-10-CM | POA: Diagnosis not present

## 2020-09-05 ENCOUNTER — Ambulatory Visit
Admission: RE | Admit: 2020-09-05 | Discharge: 2020-09-05 | Disposition: A | Discharge status: Home / Self Care | Payer: Medicare HMO | Source: Ambulatory Visit | Attending: Internal Medicine | Admitting: Internal Medicine

## 2020-09-05 DIAGNOSIS — N281 Cyst of kidney, acquired: Secondary | ICD-10-CM | POA: Diagnosis not present

## 2020-09-05 DIAGNOSIS — R7989 Other specified abnormal findings of blood chemistry: Secondary | ICD-10-CM | POA: Diagnosis not present

## 2020-09-05 DIAGNOSIS — R748 Abnormal levels of other serum enzymes: Secondary | ICD-10-CM

## 2020-10-09 DIAGNOSIS — E1169 Type 2 diabetes mellitus with other specified complication: Secondary | ICD-10-CM | POA: Diagnosis not present

## 2020-10-09 DIAGNOSIS — I25810 Atherosclerosis of coronary artery bypass graft(s) without angina pectoris: Secondary | ICD-10-CM | POA: Diagnosis not present

## 2020-10-09 DIAGNOSIS — E7800 Pure hypercholesterolemia, unspecified: Secondary | ICD-10-CM | POA: Diagnosis not present

## 2020-10-09 DIAGNOSIS — Z6824 Body mass index (BMI) 24.0-24.9, adult: Secondary | ICD-10-CM | POA: Diagnosis not present

## 2020-10-10 ENCOUNTER — Other Ambulatory Visit: Payer: Self-pay | Admitting: Internal Medicine

## 2020-10-10 DIAGNOSIS — E1169 Type 2 diabetes mellitus with other specified complication: Secondary | ICD-10-CM

## 2020-10-16 ENCOUNTER — Other Ambulatory Visit: Payer: Self-pay | Admitting: Cardiology

## 2020-10-16 DIAGNOSIS — I25708 Atherosclerosis of coronary artery bypass graft(s), unspecified, with other forms of angina pectoris: Secondary | ICD-10-CM

## 2020-10-16 DIAGNOSIS — E782 Mixed hyperlipidemia: Secondary | ICD-10-CM

## 2020-10-21 DIAGNOSIS — H53483 Generalized contraction of visual field, bilateral: Secondary | ICD-10-CM | POA: Diagnosis not present

## 2020-10-24 ENCOUNTER — Ambulatory Visit
Admission: RE | Admit: 2020-10-24 | Discharge: 2020-10-24 | Disposition: A | Discharge status: Home / Self Care | Payer: Medicare HMO | Source: Ambulatory Visit | Attending: Internal Medicine | Admitting: Internal Medicine

## 2020-10-24 DIAGNOSIS — M79604 Pain in right leg: Secondary | ICD-10-CM | POA: Diagnosis not present

## 2020-10-24 DIAGNOSIS — M79605 Pain in left leg: Secondary | ICD-10-CM | POA: Diagnosis not present

## 2020-10-24 DIAGNOSIS — E1169 Type 2 diabetes mellitus with other specified complication: Secondary | ICD-10-CM

## 2020-11-19 ENCOUNTER — Other Ambulatory Visit: Payer: Self-pay | Admitting: Cardiology

## 2020-12-05 DIAGNOSIS — Z961 Presence of intraocular lens: Secondary | ICD-10-CM | POA: Diagnosis not present

## 2020-12-05 DIAGNOSIS — H43393 Other vitreous opacities, bilateral: Secondary | ICD-10-CM | POA: Diagnosis not present

## 2020-12-17 ENCOUNTER — Other Ambulatory Visit: Payer: Self-pay | Admitting: Cardiology

## 2020-12-17 DIAGNOSIS — H02413 Mechanical ptosis of bilateral eyelids: Secondary | ICD-10-CM | POA: Diagnosis not present

## 2020-12-17 DIAGNOSIS — H0279 Other degenerative disorders of eyelid and periocular area: Secondary | ICD-10-CM | POA: Diagnosis not present

## 2020-12-17 DIAGNOSIS — I25708 Atherosclerosis of coronary artery bypass graft(s), unspecified, with other forms of angina pectoris: Secondary | ICD-10-CM

## 2020-12-17 DIAGNOSIS — H02831 Dermatochalasis of right upper eyelid: Secondary | ICD-10-CM | POA: Diagnosis not present

## 2020-12-17 DIAGNOSIS — H02834 Dermatochalasis of left upper eyelid: Secondary | ICD-10-CM | POA: Diagnosis not present

## 2020-12-17 DIAGNOSIS — E782 Mixed hyperlipidemia: Secondary | ICD-10-CM

## 2020-12-17 DIAGNOSIS — H53483 Generalized contraction of visual field, bilateral: Secondary | ICD-10-CM | POA: Diagnosis not present

## 2020-12-17 DIAGNOSIS — H57813 Brow ptosis, bilateral: Secondary | ICD-10-CM | POA: Diagnosis not present

## 2020-12-17 DIAGNOSIS — H02423 Myogenic ptosis of bilateral eyelids: Secondary | ICD-10-CM | POA: Diagnosis not present

## 2021-01-01 DIAGNOSIS — Z1283 Encounter for screening for malignant neoplasm of skin: Secondary | ICD-10-CM | POA: Diagnosis not present

## 2021-01-01 DIAGNOSIS — X32XXXA Exposure to sunlight, initial encounter: Secondary | ICD-10-CM | POA: Diagnosis not present

## 2021-01-01 DIAGNOSIS — L218 Other seborrheic dermatitis: Secondary | ICD-10-CM | POA: Diagnosis not present

## 2021-01-01 DIAGNOSIS — L570 Actinic keratosis: Secondary | ICD-10-CM | POA: Diagnosis not present

## 2021-01-01 DIAGNOSIS — B078 Other viral warts: Secondary | ICD-10-CM | POA: Diagnosis not present

## 2021-01-13 DIAGNOSIS — E7800 Pure hypercholesterolemia, unspecified: Secondary | ICD-10-CM | POA: Diagnosis not present

## 2021-01-13 DIAGNOSIS — E1165 Type 2 diabetes mellitus with hyperglycemia: Secondary | ICD-10-CM | POA: Diagnosis not present

## 2021-01-13 DIAGNOSIS — Z6824 Body mass index (BMI) 24.0-24.9, adult: Secondary | ICD-10-CM | POA: Diagnosis not present

## 2021-01-13 DIAGNOSIS — I25810 Atherosclerosis of coronary artery bypass graft(s) without angina pectoris: Secondary | ICD-10-CM | POA: Diagnosis not present

## 2021-01-13 DIAGNOSIS — E1169 Type 2 diabetes mellitus with other specified complication: Secondary | ICD-10-CM | POA: Diagnosis not present

## 2021-02-12 DIAGNOSIS — M818 Other osteoporosis without current pathological fracture: Secondary | ICD-10-CM | POA: Diagnosis not present

## 2021-02-12 DIAGNOSIS — N958 Other specified menopausal and perimenopausal disorders: Secondary | ICD-10-CM | POA: Diagnosis not present

## 2021-02-12 DIAGNOSIS — M8588 Other specified disorders of bone density and structure, other site: Secondary | ICD-10-CM | POA: Diagnosis not present

## 2021-02-20 DIAGNOSIS — E1165 Type 2 diabetes mellitus with hyperglycemia: Secondary | ICD-10-CM | POA: Diagnosis not present

## 2021-03-12 ENCOUNTER — Other Ambulatory Visit: Payer: Self-pay | Admitting: Cardiology

## 2021-04-17 DIAGNOSIS — E1169 Type 2 diabetes mellitus with other specified complication: Secondary | ICD-10-CM | POA: Diagnosis not present

## 2021-04-17 DIAGNOSIS — E7800 Pure hypercholesterolemia, unspecified: Secondary | ICD-10-CM | POA: Diagnosis not present

## 2021-04-17 DIAGNOSIS — Z6824 Body mass index (BMI) 24.0-24.9, adult: Secondary | ICD-10-CM | POA: Diagnosis not present

## 2021-04-17 DIAGNOSIS — I25810 Atherosclerosis of coronary artery bypass graft(s) without angina pectoris: Secondary | ICD-10-CM | POA: Diagnosis not present

## 2021-05-19 ENCOUNTER — Other Ambulatory Visit: Payer: Self-pay | Admitting: Cardiology

## 2021-05-19 DIAGNOSIS — E782 Mixed hyperlipidemia: Secondary | ICD-10-CM

## 2021-05-19 DIAGNOSIS — I25708 Atherosclerosis of coronary artery bypass graft(s), unspecified, with other forms of angina pectoris: Secondary | ICD-10-CM

## 2021-06-16 DIAGNOSIS — Z1231 Encounter for screening mammogram for malignant neoplasm of breast: Secondary | ICD-10-CM | POA: Diagnosis not present

## 2021-06-16 DIAGNOSIS — Z6827 Body mass index (BMI) 27.0-27.9, adult: Secondary | ICD-10-CM | POA: Diagnosis not present

## 2021-06-16 DIAGNOSIS — Z124 Encounter for screening for malignant neoplasm of cervix: Secondary | ICD-10-CM | POA: Diagnosis not present

## 2021-07-07 ENCOUNTER — Other Ambulatory Visit: Payer: Self-pay | Admitting: Cardiology

## 2021-07-07 DIAGNOSIS — I25708 Atherosclerosis of coronary artery bypass graft(s), unspecified, with other forms of angina pectoris: Secondary | ICD-10-CM

## 2021-07-07 DIAGNOSIS — E782 Mixed hyperlipidemia: Secondary | ICD-10-CM

## 2021-07-11 ENCOUNTER — Encounter: Payer: Self-pay | Admitting: Cardiology

## 2021-07-11 ENCOUNTER — Other Ambulatory Visit: Payer: Self-pay

## 2021-07-11 ENCOUNTER — Ambulatory Visit: Payer: Medicare HMO | Admitting: Cardiology

## 2021-07-11 VITALS — BP 128/65 | HR 55 | Temp 98.2°F | Ht 62.0 in | Wt 138.0 lb

## 2021-07-11 DIAGNOSIS — E782 Mixed hyperlipidemia: Secondary | ICD-10-CM

## 2021-07-11 DIAGNOSIS — I25810 Atherosclerosis of coronary artery bypass graft(s) without angina pectoris: Secondary | ICD-10-CM

## 2021-07-11 DIAGNOSIS — I739 Peripheral vascular disease, unspecified: Secondary | ICD-10-CM

## 2021-07-11 DIAGNOSIS — E1165 Type 2 diabetes mellitus with hyperglycemia: Secondary | ICD-10-CM

## 2021-07-11 MED ORDER — NITROGLYCERIN 0.4 MG SL SUBL: 0.4000 mg | SUBLINGUAL_TABLET | SUBLINGUAL | 3 refills | Status: DC | PRN

## 2021-07-11 NOTE — Progress Notes (Signed)
Follow up visit  Subjective:   Christine [name], female    DOB: December 27, 1950, 71 y.o.   MRN: [id number]   Chief Complaint  Patient presents with   Coronary Artery Disease   Follow-up    HPI  71 year old Caucasian female with uncontrolled diabetes, hyperlipidemia, CAD CABG 5 (LIMA - LAD, SVG-DIAG 1A - DIAG 1B, SVG-OM1 - OM2 04/2017), h/o basal cell carcinoma  Patient denies chest pain, shortness of breath, palpitations, leg edema, orthopnea, PND, TIA/syncope. Recent lipid panel reviewed with the patient, details below. She complains of hands and feet staying cold. She reports occasional pain in hr calves and feet on walking.   Current Outpatient Medications on File Prior to Visit  Medication Sig Dispense Refill   aspirin EC 81 MG tablet Take 81 mg by mouth every evening.      atorvastatin (LIPITOR) 40 MG tablet Take 1 tablet by mouth once daily 90 tablet 0   Cholecalciferol 50 MCG (2000 UT) CAPS Take 1 tablet by mouth daily.     ketorolac (ACULAR) 0.5 % ophthalmic solution Place 1 drop into the right eye 3 (three) times daily.     metoprolol tartrate (LOPRESSOR) 25 MG tablet Take 1 tablet by mouth twice daily 180 tablet 0   nitroGLYCERIN (NITROSTAT) 0.4 MG SL tablet Place 1 tablet (0.4 mg total) under the tongue every 5 (five) minutes as needed for chest pain. 90 tablet 3   pantoprazole (PROTONIX) 40 MG tablet Take 40 mg by mouth daily.     RYBELSUS 7 MG TABS Take 1 tablet by mouth every morning.     amLODipine (NORVASC) 2.5 MG tablet Take 2 tablets by mouth once daily (Patient not taking: Reported on 07/11/2021) 180 tablet 0   Multiple Vitamins-Minerals (MULTIVITAMIN WITH MINERALS) tablet Take 1 tablet by mouth 4 (four) times a week.  (Patient not taking: Reported on 07/11/2021)     ofloxacin (OCUFLOX) 0.3 % ophthalmic solution Place 1 drop into the right eye 3 (three) times daily. (Patient not taking: Reported on 07/11/2021)     prednisoLONE acetate (PRED FORTE) 1 % ophthalmic  suspension Place 1 drop into the right eye 3 (three) times daily.     No current facility-administered medications on file prior to visit.    Cardiovascular studies:  EKG 07/11/2021: Probable sinus rhythm 58 bpm Leftward axis Poor R wave progression   Coronary/bypass graft angiography 04/25/2019: LM: Normal LAD: Severe proximal calcific disease, followed by 10% occlusion LCx: Severe calcific ostial disease, followed by severe mid vessel disease RCA: Nondominant small vessel with severe diffuse disease   LIMA-LAD: Patent with excellent distal runoff SVG-OM1, OM2: Patent with excellent distal runoff SVG-Diag1, diag2:  Patent with excellent distal runoff   LVEDP 1 mmHg   Severe native vessel CAD Patent grafts (LIMA-LAD, SVG-Diag seq graft, SVG-OM, seq graft)  Lower extremity arterial duplex 06/02/2017: No hemodynamically significant stenoses are identified in the right lower extremity arterial system. No hemodynamically significant stenoses are identified in the left lower extremity arterial system. This exam reveals normal perfusion of the right lower extremity RABI 0.98 and mildly decreased perfusion of the left lower extremity, noted at the post tibial artery level ABI 0.94. However there was triphasic (normal waveform) noted at the level of the ankle.  Clinical correlation recommended.  CABG 04/09/2017 (Dr. Roxan Hockey): LIMA - LAD SVG-DIAG 1A - DIAG 1B SVG-OM1 - OM2  Coronary angiography 04/01/2017: LM: Distal 40% stenosis LAD: Ostial to mid 99-70% stenosis.  Diagonal 1 with mid  70% stenosis. LCx: Large, dominant vessel.  Ostial 80%, mid 60% stenosis.  OM 2 with proximal 60% stenosis. RCA: Nondominant vessel.  Mid diffuse 80% stenosis.  Recent labs: 01/2021: Glucose 161, BUN/Cr 14/0.86. EGFR 69. Na/K 138/5.1. Rest of the CMP normal H/H 12/37. MCV 88. Platelets 263 HbA1C 7.7% Chol 181, TG 135, HDL 55, LDL 100  07/26/2020: Chol 205, TG 154, HDL 57, LDL 121  03/2017:   Chol 301, TG 585, HDL 42, LDL 150.  05/05/2017: H/H 11.5/37.1.  MCV 27.  Triglycerides 436.  Review of Systems  Cardiovascular:  Positive for claudication. Negative for chest pain, dyspnea on exertion, leg swelling, palpitations and syncope.      Vitals:   07/11/21 0923  BP: 128/65  Pulse: (!) 55  Temp: 98.2 F (36.8 C)  SpO2: 94%      Objective:   Physical Exam Vitals and nursing note reviewed.  Constitutional:      Appearance: She is well-developed.  Neck:     Vascular: No JVD.  Cardiovascular:     Rate and Rhythm: Normal rate and regular rhythm.     Pulses: Intact distal pulses.          Dorsalis pedis pulses are 1+ on the right side and 1+ on the left side.       Posterior tibial pulses are 0 on the right side and 0 on the left side.     Heart sounds: Normal heart sounds. No murmur heard. Pulmonary:     Effort: Pulmonary effort is normal.     Breath sounds: Normal breath sounds. No wheezing or rales.  Musculoskeletal:     Right lower leg: No edema.     Left lower leg: No edema.          Assessment & Recommendations:   71 year old Caucasian female with uncontrolled diabetes, hyperlipidemia, CAD CABG 5 (LIMA - LAD, SVG-DIAG 1A - DIAG 1B, SVG-OM1 - OM2 04/2017), h/o basal cell carcinoma  CAD: Severe native vessel disease with patent grafts (04/2019). No angina symptoms at this time. Continue Aspirin, lipitor, current anti-anginal therapy. See below re: lipid management  Mixed hyperlipidemia: LDL 100, down from 150. Goal would be <70 given her CAD Discussed increasing physical activity.  She has not tolerated lipitor 80 mg due to mylagias, tolerating 40 mg well. Discussed addition of Zetia 10 mg, Repatha, or inclisiran. She is reluctant to add any other lipid lowering therapy at this time. Will f/u w/PCP with lipid panel  Diabetes melitus: Continue aggressive management as per PCP. Consider adding Jardiance 10 mg daily for its CV  benefits.  Claudication: Mild. I don't think her "cold feet and hands" sensation is due to her PAD, though. Suspect anemia or hypothyroidism. Has annual f/u and labs w/PCP soon. Nonetheless, I will check LEA duplex. Encourage walking and risk factor modification as above.   F/u in 1 year  Nigel Mormon, MD [facility] Pager: [phone] Office: [phone] If no answer Cell [phone]

## 2021-07-21 DIAGNOSIS — H02831 Dermatochalasis of right upper eyelid: Secondary | ICD-10-CM | POA: Diagnosis not present

## 2021-07-21 DIAGNOSIS — H02834 Dermatochalasis of left upper eyelid: Secondary | ICD-10-CM | POA: Diagnosis not present

## 2021-07-21 DIAGNOSIS — H02423 Myogenic ptosis of bilateral eyelids: Secondary | ICD-10-CM | POA: Diagnosis not present

## 2021-07-30 ENCOUNTER — Other Ambulatory Visit: Payer: Medicare HMO

## 2021-08-12 ENCOUNTER — Other Ambulatory Visit: Payer: Self-pay

## 2021-08-12 ENCOUNTER — Ambulatory Visit: Payer: Medicare HMO

## 2021-08-12 DIAGNOSIS — I739 Peripheral vascular disease, unspecified: Secondary | ICD-10-CM

## 2021-08-20 DIAGNOSIS — E119 Type 2 diabetes mellitus without complications: Secondary | ICD-10-CM | POA: Diagnosis not present

## 2021-08-20 DIAGNOSIS — R748 Abnormal levels of other serum enzymes: Secondary | ICD-10-CM | POA: Diagnosis not present

## 2021-08-23 ENCOUNTER — Other Ambulatory Visit: Payer: Self-pay | Admitting: Cardiology

## 2021-08-25 DIAGNOSIS — I25810 Atherosclerosis of coronary artery bypass graft(s) without angina pectoris: Secondary | ICD-10-CM | POA: Diagnosis not present

## 2021-08-25 DIAGNOSIS — E559 Vitamin D deficiency, unspecified: Secondary | ICD-10-CM | POA: Diagnosis not present

## 2021-08-25 DIAGNOSIS — E7800 Pure hypercholesterolemia, unspecified: Secondary | ICD-10-CM | POA: Diagnosis not present

## 2021-08-25 DIAGNOSIS — Z0000 Encounter for general adult medical examination without abnormal findings: Secondary | ICD-10-CM | POA: Diagnosis not present

## 2021-08-25 DIAGNOSIS — E1165 Type 2 diabetes mellitus with hyperglycemia: Secondary | ICD-10-CM | POA: Diagnosis not present

## 2021-09-11 DIAGNOSIS — E1165 Type 2 diabetes mellitus with hyperglycemia: Secondary | ICD-10-CM | POA: Diagnosis not present

## 2021-09-11 DIAGNOSIS — I2510 Atherosclerotic heart disease of native coronary artery without angina pectoris: Secondary | ICD-10-CM | POA: Diagnosis not present

## 2021-09-23 DIAGNOSIS — I2510 Atherosclerotic heart disease of native coronary artery without angina pectoris: Secondary | ICD-10-CM | POA: Diagnosis not present

## 2021-09-23 DIAGNOSIS — E1165 Type 2 diabetes mellitus with hyperglycemia: Secondary | ICD-10-CM | POA: Diagnosis not present

## 2021-09-23 DIAGNOSIS — E7800 Pure hypercholesterolemia, unspecified: Secondary | ICD-10-CM | POA: Diagnosis not present

## 2021-10-28 DIAGNOSIS — H02834 Dermatochalasis of left upper eyelid: Secondary | ICD-10-CM | POA: Diagnosis not present

## 2021-10-28 DIAGNOSIS — H02421 Myogenic ptosis of right eyelid: Secondary | ICD-10-CM | POA: Diagnosis not present

## 2021-10-28 DIAGNOSIS — H02422 Myogenic ptosis of left eyelid: Secondary | ICD-10-CM | POA: Diagnosis not present

## 2021-10-28 DIAGNOSIS — H02423 Myogenic ptosis of bilateral eyelids: Secondary | ICD-10-CM | POA: Diagnosis not present

## 2021-10-28 DIAGNOSIS — H02831 Dermatochalasis of right upper eyelid: Secondary | ICD-10-CM | POA: Diagnosis not present

## 2021-11-07 ENCOUNTER — Other Ambulatory Visit: Payer: Self-pay | Admitting: Cardiology

## 2021-11-07 DIAGNOSIS — I25708 Atherosclerosis of coronary artery bypass graft(s), unspecified, with other forms of angina pectoris: Secondary | ICD-10-CM

## 2021-11-07 DIAGNOSIS — E782 Mixed hyperlipidemia: Secondary | ICD-10-CM

## 2021-11-25 DIAGNOSIS — E7800 Pure hypercholesterolemia, unspecified: Secondary | ICD-10-CM | POA: Diagnosis not present

## 2021-11-25 DIAGNOSIS — Z0000 Encounter for general adult medical examination without abnormal findings: Secondary | ICD-10-CM | POA: Diagnosis not present

## 2021-11-25 DIAGNOSIS — E1165 Type 2 diabetes mellitus with hyperglycemia: Secondary | ICD-10-CM | POA: Diagnosis not present

## 2021-11-25 DIAGNOSIS — E559 Vitamin D deficiency, unspecified: Secondary | ICD-10-CM | POA: Diagnosis not present

## 2021-12-04 DIAGNOSIS — R1314 Dysphagia, pharyngoesophageal phase: Secondary | ICD-10-CM | POA: Diagnosis not present

## 2021-12-04 DIAGNOSIS — I25810 Atherosclerosis of coronary artery bypass graft(s) without angina pectoris: Secondary | ICD-10-CM | POA: Diagnosis not present

## 2021-12-04 DIAGNOSIS — E1169 Type 2 diabetes mellitus with other specified complication: Secondary | ICD-10-CM | POA: Diagnosis not present

## 2021-12-04 DIAGNOSIS — R002 Palpitations: Secondary | ICD-10-CM | POA: Diagnosis not present

## 2021-12-11 DIAGNOSIS — Z961 Presence of intraocular lens: Secondary | ICD-10-CM | POA: Diagnosis not present

## 2021-12-11 DIAGNOSIS — H524 Presbyopia: Secondary | ICD-10-CM | POA: Diagnosis not present

## 2021-12-11 DIAGNOSIS — E119 Type 2 diabetes mellitus without complications: Secondary | ICD-10-CM | POA: Diagnosis not present

## 2021-12-17 DIAGNOSIS — H26492 Other secondary cataract, left eye: Secondary | ICD-10-CM | POA: Diagnosis not present

## 2021-12-22 DIAGNOSIS — R002 Palpitations: Secondary | ICD-10-CM | POA: Diagnosis not present

## 2021-12-31 DIAGNOSIS — R002 Palpitations: Secondary | ICD-10-CM | POA: Diagnosis not present

## 2021-12-31 DIAGNOSIS — H26491 Other secondary cataract, right eye: Secondary | ICD-10-CM | POA: Diagnosis not present

## 2022-01-02 ENCOUNTER — Other Ambulatory Visit: Payer: Self-pay | Admitting: Cardiology

## 2022-02-12 ENCOUNTER — Other Ambulatory Visit: Payer: Self-pay | Admitting: Cardiology

## 2022-02-12 DIAGNOSIS — I25708 Atherosclerosis of coronary artery bypass graft(s), unspecified, with other forms of angina pectoris: Secondary | ICD-10-CM

## 2022-02-12 DIAGNOSIS — E782 Mixed hyperlipidemia: Secondary | ICD-10-CM

## 2022-03-11 DIAGNOSIS — E7800 Pure hypercholesterolemia, unspecified: Secondary | ICD-10-CM | POA: Diagnosis not present

## 2022-03-11 DIAGNOSIS — E1165 Type 2 diabetes mellitus with hyperglycemia: Secondary | ICD-10-CM | POA: Diagnosis not present

## 2022-03-11 DIAGNOSIS — I25810 Atherosclerosis of coronary artery bypass graft(s) without angina pectoris: Secondary | ICD-10-CM | POA: Diagnosis not present

## 2022-03-23 ENCOUNTER — Ambulatory Visit: Payer: Medicare HMO | Admitting: Podiatry

## 2022-03-23 DIAGNOSIS — I739 Peripheral vascular disease, unspecified: Secondary | ICD-10-CM

## 2022-03-23 DIAGNOSIS — R252 Cramp and spasm: Secondary | ICD-10-CM | POA: Diagnosis not present

## 2022-03-23 DIAGNOSIS — E1142 Type 2 diabetes mellitus with diabetic polyneuropathy: Secondary | ICD-10-CM | POA: Diagnosis not present

## 2022-03-23 NOTE — Progress Notes (Signed)
  Subjective:  Patient ID: Christine [name], female    DOB: [birth year],  MRN: [id number]  Chief Complaint  Patient presents with   Foot Problem    losing feeling in his feet /cramping to bilateral arch and toes. Diabetic exam was performed about 10 days ago and was notified of the numbness to bilateral feet. Patient is diabetic and last blood sugar was 168 this morning.     71 y.o. female presents with the above complaint. History confirmed with patient. Patient presenting with feeling of numbness in both forefoot, referred from primary. Patient also reports cramping in both feet, mostly at night. Denies pain in legs or feet when walking distances.   Objective:  Physical Exam: warm, good capillary refill, DP and PT pulses 1/4 bilateral nail exam onychomycosis of the toenails DP pulses palpable, PT pulses palpable, and protective sensation absent Left Foot:  Nontender to palpation, decreased sensation with monofilament exam absent to forefoot Right Foot: Nontender to palpation, decreased sensation with monofilament exam absent to forefoot Assessment:   1. DM type 2 with diabetic peripheral neuropathy ([place])   2. Cramping of feet   3. PAD (peripheral artery disease) ([place])      Plan:  Patient was evaluated and treated and all questions answered.  #Diabetes type 2 with peripheral neuropathy Patient educated on diabetes. Discussed proper diabetic foot care and discussed risks and complications of disease. Educated patient in depth on reasons to return to the office immediately should he/she discover anything concerning or new on the feet. All questions answered. Discussed proper shoes as well.   #Peripheral arterial disease -Discussed with patient that she does have some diminished arterial pulses present bilateral foot. -We will monitor at this time.  She reports she had arterial blood flow testing done approximately 1 year ago and was told that nothing was significantly abnormal  #Foot  cramping of feet -Recommend use of daily oral magnesium supplementation for cramping of both feet.  Return in about 6 months (around 09/22/2022) for routine DM exam.         Everitt Amber, [place] [facility]

## 2022-04-03 ENCOUNTER — Other Ambulatory Visit: Payer: Self-pay | Admitting: Cardiology

## 2022-04-28 ENCOUNTER — Other Ambulatory Visit: Payer: Self-pay | Admitting: Cardiology

## 2022-04-28 DIAGNOSIS — I25708 Atherosclerosis of coronary artery bypass graft(s), unspecified, with other forms of angina pectoris: Secondary | ICD-10-CM

## 2022-04-28 DIAGNOSIS — E782 Mixed hyperlipidemia: Secondary | ICD-10-CM

## 2022-05-16 DIAGNOSIS — M791 Myalgia, unspecified site: Secondary | ICD-10-CM | POA: Diagnosis not present

## 2022-05-16 DIAGNOSIS — J029 Acute pharyngitis, unspecified: Secondary | ICD-10-CM | POA: Diagnosis not present

## 2022-06-16 DIAGNOSIS — E1165 Type 2 diabetes mellitus with hyperglycemia: Secondary | ICD-10-CM | POA: Diagnosis not present

## 2022-06-16 DIAGNOSIS — E7800 Pure hypercholesterolemia, unspecified: Secondary | ICD-10-CM | POA: Diagnosis not present

## 2022-06-16 DIAGNOSIS — I25810 Atherosclerosis of coronary artery bypass graft(s) without angina pectoris: Secondary | ICD-10-CM | POA: Diagnosis not present

## 2022-06-16 DIAGNOSIS — E1169 Type 2 diabetes mellitus with other specified complication: Secondary | ICD-10-CM | POA: Diagnosis not present

## 2022-07-13 ENCOUNTER — Encounter: Payer: Medicare HMO | Admitting: Cardiology

## 2022-07-13 NOTE — Progress Notes (Deleted)
Follow up visit  Subjective:   Christine [name], female    DOB: 1950-09-16, 72 y.o.   MRN: [id number]   No chief complaint on file.   HPI  72 year old Caucasian female with uncontrolled diabetes, hyperlipidemia, CAD CABG 5 (LIMA - LAD, SVG-DIAG 1A - DIAG 1B, SVG-OM1 - OM2 04/2017), h/o basal cell carcinoma  Patient denies chest pain, shortness of breath, palpitations, leg edema, orthopnea, PND, TIA/syncope. Recent lipid panel reviewed with the patient, details below. She complains of hands and feet staying cold. She reports occasional pain in hr calves and feet on walking.    Current Outpatient Medications:    amLODipine (NORVASC) 2.5 MG tablet, Take 2 tablets by mouth once daily (Patient not taking: Reported on 07/11/2021), Disp: 180 tablet, Rfl: 0   aspirin EC 81 MG tablet, Take 81 mg by mouth every evening. , Disp: , Rfl:    atorvastatin (LIPITOR) 40 MG tablet, Take 1 tablet by mouth once daily, Disp: 90 tablet, Rfl: 0   Cholecalciferol 50 MCG (2000 UT) CAPS, Take 1 tablet by mouth daily., Disp: , Rfl:    ketorolac (ACULAR) 0.5 % ophthalmic solution, Place 1 drop into the right eye 3 (three) times daily., Disp: , Rfl:    metoprolol tartrate (LOPRESSOR) 25 MG tablet, Take 1 tablet by mouth twice daily, Disp: 180 tablet, Rfl: 0   Multiple Vitamins-Minerals (MULTIVITAMIN WITH MINERALS) tablet, Take 1 tablet by mouth 4 (four) times a week.  (Patient not taking: Reported on 07/11/2021), Disp: , Rfl:    nitroGLYCERIN (NITROSTAT) 0.4 MG SL tablet, Place 1 tablet (0.4 mg total) under the tongue every 5 (five) minutes as needed for chest pain., Disp: 30 tablet, Rfl: 3   ofloxacin (OCUFLOX) 0.3 % ophthalmic solution, Place 1 drop into the right eye 3 (three) times daily. (Patient not taking: Reported on 07/11/2021), Disp: , Rfl:    pantoprazole (PROTONIX) 40 MG tablet, Take 40 mg by mouth daily., Disp: , Rfl:    prednisoLONE acetate (PRED FORTE) 1 % ophthalmic suspension, Place 1 drop into the  right eye 3 (three) times daily., Disp: , Rfl:    RYBELSUS 7 MG TABS, Take 1 tablet by mouth every morning., Disp: , Rfl:    Cardiovascular studies:  EKG 07/11/2021: Probable sinus rhythm 58 bpm Leftward axis Poor R wave progression   Coronary/bypass graft angiography 04/25/2019: LM: Normal LAD: Severe proximal calcific disease, followed by 10% occlusion LCx: Severe calcific ostial disease, followed by severe mid vessel disease RCA: Nondominant small vessel with severe diffuse disease   LIMA-LAD: Patent with excellent distal runoff SVG-OM1, OM2: Patent with excellent distal runoff SVG-Diag1, diag2:  Patent with excellent distal runoff   LVEDP 1 mmHg   Severe native vessel CAD Patent grafts (LIMA-LAD, SVG-Diag seq graft, SVG-OM, seq graft)  Lower extremity arterial duplex 06/02/2017: No hemodynamically significant stenoses are identified in the right lower extremity arterial system. No hemodynamically significant stenoses are identified in the left lower extremity arterial system. This exam reveals normal perfusion of the right lower extremity RABI 0.98 and mildly decreased perfusion of the left lower extremity, noted at the post tibial artery level ABI 0.94. However there was triphasic (normal waveform) noted at the level of the ankle.  Clinical correlation recommended.  CABG 04/09/2017 (Dr. Roxan Hockey): LIMA - LAD SVG-DIAG 1A - DIAG 1B SVG-OM1 - OM2  Coronary angiography 04/01/2017: LM: Distal 40% stenosis LAD: Ostial to mid 99-70% stenosis.  Diagonal 1 with mid 70% stenosis. LCx: Large, dominant vessel.  Ostial 80%, mid 60% stenosis.  OM 2 with proximal 60% stenosis. RCA: Nondominant vessel.  Mid diffuse 80% stenosis.  Recent labs: 06/16/2022: HbA1C 8.0%  11/25/2021: Glucose 144, BUN/Cr 18/0.86. EGFR 69. Na/K 135/4.7. Rest of the CMP normal H/H 12/39. MCV 88. Platelets 276 HbA1C 7.8% Chol 198, TG 134, HDL 62, LDL 109 TSH NA    Review of Systems  Cardiovascular:   Positive for claudication. Negative for chest pain, dyspnea on exertion, leg swelling, palpitations and syncope.       There were no vitals filed for this visit.     Objective:   Physical Exam Vitals and nursing note reviewed.  Constitutional:      Appearance: She is well-developed.  Neck:     Vascular: No JVD.  Cardiovascular:     Rate and Rhythm: Normal rate and regular rhythm.     Pulses: Intact distal pulses.          Dorsalis pedis pulses are 1+ on the right side and 1+ on the left side.       Posterior tibial pulses are 0 on the right side and 0 on the left side.     Heart sounds: Normal heart sounds. No murmur heard. Pulmonary:     Effort: Pulmonary effort is normal.     Breath sounds: Normal breath sounds. No wheezing or rales.  Musculoskeletal:     Right lower leg: No edema.     Left lower leg: No edema.           Assessment & Recommendations:   72 year-old Caucasian female with uncontrolled diabetes, hyperlipidemia, CAD CABG 5 (LIMA - LAD, SVG-DIAG 1A - DIAG 1B, SVG-OM1 - OM2 04/2017), h/o basal cell carcinoma  CAD: Severe native vessel disease with patent grafts (04/2019). No angina symptoms at this time. Continue Aspirin, lipitor, current anti-anginal therapy. See below re: lipid management  Mixed hyperlipidemia: LDL 100, down from 150. Goal would be <70 given her CAD Discussed increasing physical activity.  She has not tolerated lipitor 80 mg due to mylagias, tolerating 40 mg well. Discussed addition of Zetia 10 mg, Repatha, or inclisiran. She is reluctant to add any other lipid lowering therapy at this time. Will f/u w/PCP with lipid panel  Diabetes melitus: Continue aggressive management as per PCP. Consider adding Jardiance 10 mg daily for its CV benefits.  Claudication: Mild. I don't think her "cold feet and hands" sensation is due to her PAD, though. Suspect anemia or hypothyroidism. Has annual f/u and labs w/PCP soon. Nonetheless, I will  check LEA duplex. Encourage walking and risk factor modification as above.   F/u in 1 year  Nigel Mormon, MD [facility] [place] Pager: [phone] Office: [phone] If no answer Cell [phone]

## 2022-08-05 DIAGNOSIS — J069 Acute upper respiratory infection, unspecified: Secondary | ICD-10-CM | POA: Diagnosis not present

## 2022-08-10 DIAGNOSIS — J189 Pneumonia, unspecified organism: Secondary | ICD-10-CM | POA: Diagnosis not present

## 2022-08-10 DIAGNOSIS — R509 Fever, unspecified: Secondary | ICD-10-CM | POA: Diagnosis not present

## 2022-08-11 ENCOUNTER — Other Ambulatory Visit: Payer: Self-pay | Admitting: Cardiology

## 2022-08-11 DIAGNOSIS — I25708 Atherosclerosis of coronary artery bypass graft(s), unspecified, with other forms of angina pectoris: Secondary | ICD-10-CM

## 2022-08-11 DIAGNOSIS — E782 Mixed hyperlipidemia: Secondary | ICD-10-CM

## 2022-09-22 ENCOUNTER — Ambulatory Visit: Payer: Medicare HMO | Admitting: Podiatry

## 2022-09-25 ENCOUNTER — Ambulatory Visit: Payer: Medicare HMO | Admitting: Podiatry

## 2022-09-25 DIAGNOSIS — M722 Plantar fascial fibromatosis: Secondary | ICD-10-CM | POA: Diagnosis not present

## 2022-09-25 DIAGNOSIS — I739 Peripheral vascular disease, unspecified: Secondary | ICD-10-CM | POA: Diagnosis not present

## 2022-09-25 DIAGNOSIS — R252 Cramp and spasm: Secondary | ICD-10-CM

## 2022-09-25 DIAGNOSIS — E1142 Type 2 diabetes mellitus with diabetic polyneuropathy: Secondary | ICD-10-CM | POA: Diagnosis not present

## 2022-09-25 NOTE — Patient Instructions (Signed)

## 2022-09-25 NOTE — Progress Notes (Signed)
  Subjective:  Patient ID: Christine [name], female    DOB: [birth year],  MRN: [id number]  Chief Complaint  Patient presents with   diabetic foot care    72 y.o. female presents with the above complaint. History confirmed with patient. Patient presenting with stiffness in the bilateral hallux great toe joint.  Also coming in for routine foot exam for diabetes.  Objective:  Physical Exam: warm, good capillary refill, DP and PT pulses 1/4 bilateral nail exam onychomycosis of the toenails DP pulses palpable, PT pulses palpable, and protective sensation absent Left Foot:  Nontender to palpation, decreased sensation with monofilament exam absent to forefoot.  Decreased range of motion of the first MPJ.  Mild hammertoe deformity to fourth and fifth toes without pain Right Foot: Nontender to palpation, decreased sensation with monofilament exam absent to forefoot decreased range of motion at the first MPJ without significant pain.  Mild hammertoe deformity of the fourth and fifth toes Assessment:   1. Plantar fasciitis, bilateral   2. DM type 2 with diabetic peripheral neuropathy (HCC)   3. Cramping of feet   4. PAD (peripheral artery disease) (HCC)       Plan:  Patient was evaluated and treated and all questions answered.  #Diabetes type 2 with peripheral neuropathy Patient educated on diabetes. Discussed proper diabetic foot care and discussed risks and complications of disease. Educated patient in depth on reasons to return to the office immediately should he/she discover anything concerning or new on the feet. All questions answered. Discussed proper shoes as well.   # Planter fasciitis bilateral foot -Patient states mild cramping and pain sensation along the medial plantar arch -Recommend treatment with stretching and power step orthotics -Dispensed 1 pair of power step orthotics to the patient recommend she wear these in good supportive shoes  # Hammertoe deformity as well as hallux  limitus bilaterally -Continue to monitor is not bothering the patient I discussed what is occurring. -Recommend treatment with Voltaren gel  #Foot cramping of feet -Recommend use of daily oral magnesium supplementation for cramping of both feet.  Return in about 6 weeks (around 11/06/2022) for routine DM exam.         Corinna Gab, DPM [facility]

## 2022-09-29 DIAGNOSIS — E1169 Type 2 diabetes mellitus with other specified complication: Secondary | ICD-10-CM | POA: Diagnosis not present

## 2022-09-29 DIAGNOSIS — E1165 Type 2 diabetes mellitus with hyperglycemia: Secondary | ICD-10-CM | POA: Diagnosis not present

## 2022-09-29 DIAGNOSIS — E7800 Pure hypercholesterolemia, unspecified: Secondary | ICD-10-CM | POA: Diagnosis not present

## 2022-09-29 DIAGNOSIS — I25810 Atherosclerosis of coronary artery bypass graft(s) without angina pectoris: Secondary | ICD-10-CM | POA: Diagnosis not present

## 2022-10-06 DIAGNOSIS — D225 Melanocytic nevi of trunk: Secondary | ICD-10-CM | POA: Diagnosis not present

## 2022-10-06 DIAGNOSIS — L720 Epidermal cyst: Secondary | ICD-10-CM | POA: Diagnosis not present

## 2022-10-06 DIAGNOSIS — L818 Other specified disorders of pigmentation: Secondary | ICD-10-CM | POA: Diagnosis not present

## 2022-10-11 ENCOUNTER — Other Ambulatory Visit: Payer: Self-pay | Admitting: Cardiology

## 2022-10-11 DIAGNOSIS — I25708 Atherosclerosis of coronary artery bypass graft(s), unspecified, with other forms of angina pectoris: Secondary | ICD-10-CM

## 2022-10-11 DIAGNOSIS — E782 Mixed hyperlipidemia: Secondary | ICD-10-CM

## 2022-12-14 DIAGNOSIS — Z01419 Encounter for gynecological examination (general) (routine) without abnormal findings: Secondary | ICD-10-CM | POA: Diagnosis not present

## 2022-12-14 DIAGNOSIS — Z1231 Encounter for screening mammogram for malignant neoplasm of breast: Secondary | ICD-10-CM | POA: Diagnosis not present

## 2022-12-14 DIAGNOSIS — N760 Acute vaginitis: Secondary | ICD-10-CM | POA: Diagnosis not present

## 2022-12-14 DIAGNOSIS — N3946 Mixed incontinence: Secondary | ICD-10-CM | POA: Diagnosis not present

## 2022-12-22 DIAGNOSIS — I25810 Atherosclerosis of coronary artery bypass graft(s) without angina pectoris: Secondary | ICD-10-CM | POA: Diagnosis not present

## 2022-12-31 DIAGNOSIS — R1319 Other dysphagia: Secondary | ICD-10-CM | POA: Diagnosis not present

## 2022-12-31 DIAGNOSIS — E1169 Type 2 diabetes mellitus with other specified complication: Secondary | ICD-10-CM | POA: Diagnosis not present

## 2022-12-31 DIAGNOSIS — G4762 Sleep related leg cramps: Secondary | ICD-10-CM | POA: Diagnosis not present

## 2022-12-31 DIAGNOSIS — E1165 Type 2 diabetes mellitus with hyperglycemia: Secondary | ICD-10-CM | POA: Diagnosis not present

## 2022-12-31 DIAGNOSIS — I25810 Atherosclerosis of coronary artery bypass graft(s) without angina pectoris: Secondary | ICD-10-CM | POA: Diagnosis not present

## 2022-12-31 DIAGNOSIS — Z0000 Encounter for general adult medical examination without abnormal findings: Secondary | ICD-10-CM | POA: Diagnosis not present

## 2022-12-31 DIAGNOSIS — K219 Gastro-esophageal reflux disease without esophagitis: Secondary | ICD-10-CM | POA: Diagnosis not present

## 2022-12-31 DIAGNOSIS — E559 Vitamin D deficiency, unspecified: Secondary | ICD-10-CM | POA: Diagnosis not present

## 2023-01-05 ENCOUNTER — Other Ambulatory Visit: Payer: Self-pay | Admitting: Cardiology

## 2023-01-05 DIAGNOSIS — I25708 Atherosclerosis of coronary artery bypass graft(s), unspecified, with other forms of angina pectoris: Secondary | ICD-10-CM

## 2023-01-05 DIAGNOSIS — E782 Mixed hyperlipidemia: Secondary | ICD-10-CM

## 2023-01-14 DIAGNOSIS — R32 Unspecified urinary incontinence: Secondary | ICD-10-CM | POA: Diagnosis not present

## 2023-01-14 DIAGNOSIS — N760 Acute vaginitis: Secondary | ICD-10-CM | POA: Diagnosis not present

## 2023-02-04 NOTE — Progress Notes (Signed)
This encounter was created in error - please disregard.

## 2023-02-12 ENCOUNTER — Ambulatory Visit: Payer: Medicare HMO | Attending: Internal Medicine | Admitting: Internal Medicine

## 2023-02-12 ENCOUNTER — Encounter: Payer: Self-pay | Admitting: Internal Medicine

## 2023-02-12 VITALS — BP 124/83 | HR 56 | Ht 61.0 in | Wt 130.5 lb

## 2023-02-12 DIAGNOSIS — E785 Hyperlipidemia, unspecified: Secondary | ICD-10-CM

## 2023-02-12 DIAGNOSIS — I25119 Atherosclerotic heart disease of native coronary artery with unspecified angina pectoris: Secondary | ICD-10-CM

## 2023-02-12 DIAGNOSIS — I10 Essential (primary) hypertension: Secondary | ICD-10-CM | POA: Diagnosis not present

## 2023-02-12 DIAGNOSIS — E1169 Type 2 diabetes mellitus with other specified complication: Secondary | ICD-10-CM

## 2023-02-12 DIAGNOSIS — R0609 Other forms of dyspnea: Secondary | ICD-10-CM

## 2023-02-12 DIAGNOSIS — I2510 Atherosclerotic heart disease of native coronary artery without angina pectoris: Secondary | ICD-10-CM | POA: Diagnosis not present

## 2023-02-12 DIAGNOSIS — I6529 Occlusion and stenosis of unspecified carotid artery: Secondary | ICD-10-CM

## 2023-02-12 DIAGNOSIS — I6523 Occlusion and stenosis of bilateral carotid arteries: Secondary | ICD-10-CM

## 2023-02-12 DIAGNOSIS — R5383 Other fatigue: Secondary | ICD-10-CM | POA: Diagnosis not present

## 2023-02-12 DIAGNOSIS — R079 Chest pain, unspecified: Secondary | ICD-10-CM

## 2023-02-12 DIAGNOSIS — E782 Mixed hyperlipidemia: Secondary | ICD-10-CM

## 2023-02-12 MED ORDER — METOPROLOL TARTRATE 25 MG PO TABS: 12.5000 mg | ORAL_TABLET | Freq: Two times a day (BID) | ORAL | 3 refills | Status: DC

## 2023-02-12 NOTE — Patient Instructions (Signed)
Medication Instructions:  Your physician recommends the following medication changes.  DECREASE: Metoprolol tartrate to 12.5 mg by mouth twice a day  *If you need a refill on your cardiac medications before your next appointment, please call your pharmacy*   Lab Work: No labs ordered today    Testing/Procedures: Your physician has requested that you have an echocardiogram. Echocardiography is a painless test that uses sound waves to create images of your heart. It provides your doctor with information about the size and shape of your heart and how well your heart's chambers and valves are working.   You may receive an ultrasound enhancing agent through an IV if needed to better visualize your heart during the echo. This procedure takes approximately one hour.  There are no restrictions for this procedure.  This will take place at [address], [place] [address]   Your physician has requested that you have a carotid duplex. This test is an ultrasound of the carotid arteries in your neck. It looks at blood flow through these arteries that supply the brain with blood.   Allow one hour for this exam.  There are no restrictions or special instructions.  This will take place at [address] ([address], [place] [address]   [facility] MYOVIEW  Your Provider has ordered a Stress Test with nuclear imaging. The purpose of this test is to evaluate the blood supply to your heart muscle. This procedure is referred to as a "Non-Invasive Stress Test." This is because other than having an IV started in your vein, nothing is inserted or "invades" your body. Cardiac stress tests are done to find areas of poor blood flow to the heart by determining the extent of coronary artery disease (CAD). Some patients exercise on a treadmill, which naturally increases the blood flow to your heart, while others who are unable to walk on a treadmill due to physical  limitations have a pharmacologic/chemical stress agent called Lexiscan. This medicine will mimic walking on a treadmill by temporarily increasing your coronary blood flow.     REPORT TO [facility] ENTRANCE  **Proceed to the 1st desk on the right, REGISTRATION, to check in**  Please note: this test may take anywhere between 2-4 hours to complete    Instructions regarding medication:   _x__:   You may take all of your regular morning medications the day of your test unless listed below.    How to prepare for your Myoview test:  Do not eat or drink for 6 hours prior to the test No caffeine for 24 hours prior to the test No smoking 24 hours prior to the test. Ladies, please do not wear dresses.  Skirts or pants are appropriate. Please wear a short sleeve shirt. No perfume, cologne or lotion. Wear comfortable walking shoes. No heels!   PLEASE NOTIFY THE OFFICE AT LEAST 24 HOURS IN ADVANCE IF YOU ARE UNABLE TO KEEP YOUR APPOINTMENT.  [phone] AND  PLEASE NOTIFY NUCLEAR MEDICINE AT [facility] AT LEAST 24 HOURS IN ADVANCE IF YOU ARE UNABLE TO KEEP YOUR APPOINTMENT. [phone]   Follow-Up: At [facility], you and your health needs are our priority.  As part of our continuing mission to provide you with exceptional heart care, we have created designated Provider Care Teams.  These Care Teams include your primary Cardiologist (physician) and Advanced Practice Providers (APPs -  Physician Assistants and Nurse Practitioners) who all work together to provide you with  the care you need, when you need it.  We recommend signing up for the patient portal called "MyChart".  Sign up information is provided on this After Visit Summary.  MyChart is used to connect with patients for Virtual Visits (Telemedicine).  Patients are able to view lab/test results, encounter notes, upcoming appointments, etc.  Non-urgent messages can be sent to your provider as well.   To learn more about what  you can do with MyChart, go to [email or web address].    Your next appointment:   3 month(s)  Provider:   You may see Yvonne Kendall, MD or one of the following Advanced Practice Providers on your designated Care Team:   Nicolasa Ducking, NP Eula Listen, PA-C Cadence Fransico Michael, PA-C Charlsie Quest, NP

## 2023-02-12 NOTE — Progress Notes (Unsigned)
Cardiology Office Note:  .   Date:  02/12/2023  ID:  KAILEN MARBURY, DOB 1951-03-20, MRN [id number] PCP: Merri Brunette, MD  [facility] Providers Cardiologist:  New  History of Present Illness: .   Christine [name] is a 72 y.o. female with history of coronary artery disease status post CABG in 2018 (LIMA-LAD, sequential SVG to 2 separate branches of D1, and sequential SVG-OM1-OM2), hyperlipidemia, and type 2 diabetes mellitus, presenting to establish cardiology care in our office.  She was previously followed by Dr. Rosemary Holms at [facility] cardiovascular.  Today, Ms. Klouda reports that she has experience intermittent "cramping" in her chest with associated pain in both arms.  The pain is not exertional and has been present for the last year or two.  She also notes mild dyspnea on exertion.  Chest cramping and exertional dyspnea may be getting slightly worse.  Prior to her CABG in 2018, she was experiencing pain in her throat, which may be coming back a little.  She has mild swelling her feel.  For the last month, she has also noticed some "wooziness."  She denies syncope and falls, as well as palpitations.  Home BP is usually around 110-120/60-70.  She reports recurrent yeast infections on dapagliflozin but is hoping to stay on the medication at the recommendation of her PCP.  Ms. Meaney reports lack of energy; she notes having undergone a sleep study several years ago and believes it was normal.   ROS: See HPI  Studies Reviewed: Marland Kitchen   EKG Interpretation Date/Time:  Friday February 12 2023 09:42:42 EDT Ventricular Rate:  56 PR Interval:  156 QRS Duration:  88 QT Interval:  442 QTC Calculation: 426 R Axis:   -32  Text Interpretation: Sinus bradycardia Left axis deviation Abnormal ECG When compared with ECG of 11-Jul-2021 No significant change was found Confirmed by Aveya Beal, Cristal Deer [phone]) on 02/12/2023 9:48:17 AM     Risk Assessment/Calculations:             Physical Exam:    VS:  BP 124/83 (BP Location: Right Arm, Patient Position: Sitting, Cuff Size: Normal)   Pulse (!) 56   Ht 5\' 1"  (1.549 m)   Wt 130 lb 8 oz (59.2 kg)   SpO2 98%   BMI 24.66 kg/m    Wt Readings from Last 3 Encounters:  02/12/23 130 lb 8 oz (59.2 kg)  07/11/21 138 lb (62.6 kg)  07/11/20 131 lb 12.8 oz (59.8 kg)    General:  NAD.  Accompanied by her husband. Neck: No JVD or HJR. Lungs: Clear to auscultation bilaterally without wheezes or crackles. Heart: Regular rate and rhythm with 1/6 systolic murmur. Abdomen: Soft, nontender, nondistended. Extremities: No lower extremity edema.  ASSESSMENT AND PLAN: .    Coronary artery disease with atypical chest pain: Symptoms have been slowly progressing for a year or two, with mild intermittent throat pain now present (this was her anginal equivalent before her CABG in 2018).  We have discussed further evaluation options and have agreed to perform a pharmacologic myocardial perfusion stress test.  We will also obtain an echo.  Fatigue: This could be due to a number of factors.  However, in the setting of her resting bradycardia today, we have agreed to decrease metoprolol tartarte to 12.5 mg twice daily to see if her energy improves.  Hypertension: BP mildly elevated today (goal less than 130/80) but is typically better at home.  We will decrease metoprolol, as above.  Defer other medication  changes today.  Carotid artery stenosis: Mild bilateral ICA disease noted on pre-CABG Doppler study.  Though Ms. Guymon has not had any new neuro symptoms, we have agreed to repeat carotid evaluation to assess for progression of disease.  Continue aspirin and statin.  Hyperlipidemia associated with type 2 diabetes mellitus: Continue atorvastatin and dapagliflozin.    Informed Consent   Shared Decision Making/Informed Consent The risks [chest pain, shortness of breath, cardiac arrhythmias, dizziness, blood pressure fluctuations, myocardial infarction,  stroke/transient ischemic attack, nausea, vomiting, allergic reaction, radiation exposure, metallic taste sensation and life-threatening complications (estimated to be 1 in 10,000)], benefits (risk stratification, diagnosing coronary artery disease, treatment guidance) and alternatives of a nuclear stress test were discussed in detail with Ms. Kennedy and she agrees to proceed.     Dispo: RTC 3 months.  Signed, Yvonne Kendall, MD

## 2023-02-13 DIAGNOSIS — I10 Essential (primary) hypertension: Secondary | ICD-10-CM | POA: Insufficient documentation

## 2023-02-13 DIAGNOSIS — I6523 Occlusion and stenosis of bilateral carotid arteries: Secondary | ICD-10-CM | POA: Insufficient documentation

## 2023-02-13 DIAGNOSIS — R5383 Other fatigue: Secondary | ICD-10-CM | POA: Insufficient documentation

## 2023-02-19 ENCOUNTER — Encounter
Admission: RE | Admit: 2023-02-19 | Discharge: 2023-02-19 | Disposition: A | Discharge status: Home / Self Care | Payer: Medicare HMO | Source: Ambulatory Visit | Attending: Internal Medicine | Admitting: Internal Medicine

## 2023-02-19 DIAGNOSIS — I25119 Atherosclerotic heart disease of native coronary artery with unspecified angina pectoris: Secondary | ICD-10-CM | POA: Insufficient documentation

## 2023-02-19 LAB — NM MYOCAR MULTI W/SPECT W/WALL MOTION / EF
Estimated workload: 1
Exercise duration (min): 0 min
Exercise duration (sec): 0 s
LV dias vol: 35 mL (ref 46–106)
LV sys vol: 12 mL
MPHR: 149 {beats}/min
Nuc Stress EF: 66 %
Peak HR: 81 {beats}/min
Percent HR: 54 %
Rest HR: 57 {beats}/min
Rest Nuclear Isotope Dose: 10.5 mCi
SDS: 0
SRS: 1
SSS: 0
ST Depression (mm): 0 mm
Stress Nuclear Isotope Dose: 31.8 mCi
TID: 0.64

## 2023-02-19 MED ORDER — TECHNETIUM TC 99M TETROFOSMIN IV KIT
31.8200 | PACK | Freq: Once | INTRAVENOUS | Status: AC | PRN
  Administered 2023-02-19: 31.82 via INTRAVENOUS

## 2023-02-19 MED ORDER — REGADENOSON 0.4 MG/5ML IV SOLN
0.4000 mg | Freq: Once | INTRAVENOUS | Status: AC
  Administered 2023-02-19: 0.4 mg via INTRAVENOUS

## 2023-02-19 MED ORDER — TECHNETIUM TC 99M TETROFOSMIN IV KIT
10.8000 | PACK | Freq: Once | INTRAVENOUS | Status: AC | PRN
  Administered 2023-02-19: 10.52 via INTRAVENOUS

## 2023-02-24 DIAGNOSIS — M180 Bilateral primary osteoarthritis of first carpometacarpal joints: Secondary | ICD-10-CM | POA: Diagnosis not present

## 2023-02-24 DIAGNOSIS — M1812 Unilateral primary osteoarthritis of first carpometacarpal joint, left hand: Secondary | ICD-10-CM | POA: Diagnosis not present

## 2023-02-24 DIAGNOSIS — M25541 Pain in joints of right hand: Secondary | ICD-10-CM | POA: Diagnosis not present

## 2023-02-24 DIAGNOSIS — M25542 Pain in joints of left hand: Secondary | ICD-10-CM | POA: Diagnosis not present

## 2023-02-24 DIAGNOSIS — M1811 Unilateral primary osteoarthritis of first carpometacarpal joint, right hand: Secondary | ICD-10-CM | POA: Diagnosis not present

## 2023-03-04 ENCOUNTER — Ambulatory Visit (INDEPENDENT_AMBULATORY_CARE_PROVIDER_SITE_OTHER): Payer: Medicare HMO

## 2023-03-04 ENCOUNTER — Ambulatory Visit: Payer: Medicare HMO | Attending: Internal Medicine

## 2023-03-04 DIAGNOSIS — I25119 Atherosclerotic heart disease of native coronary artery with unspecified angina pectoris: Secondary | ICD-10-CM

## 2023-03-04 DIAGNOSIS — I6523 Occlusion and stenosis of bilateral carotid arteries: Secondary | ICD-10-CM

## 2023-03-04 DIAGNOSIS — R5383 Other fatigue: Secondary | ICD-10-CM

## 2023-03-04 LAB — ECHOCARDIOGRAM COMPLETE
AR max vel: 1.54 cm2
AV Area VTI: 1.64 cm2
AV Area mean vel: 1.58 cm2
AV Mean grad: 4 mm[Hg]
AV Peak grad: 6.8 mm[Hg]
Ao pk vel: 1.3 m/s
Area-P 1/2: 3.37 cm2
S' Lateral: 2.9 cm
Single Plane A4C EF: 52.7 %

## 2023-03-29 ENCOUNTER — Ambulatory Visit: Payer: Medicare HMO | Admitting: Podiatry

## 2023-04-08 DIAGNOSIS — I25810 Atherosclerosis of coronary artery bypass graft(s) without angina pectoris: Secondary | ICD-10-CM | POA: Diagnosis not present

## 2023-04-08 DIAGNOSIS — E7800 Pure hypercholesterolemia, unspecified: Secondary | ICD-10-CM | POA: Diagnosis not present

## 2023-04-08 DIAGNOSIS — E1169 Type 2 diabetes mellitus with other specified complication: Secondary | ICD-10-CM | POA: Diagnosis not present

## 2023-06-01 ENCOUNTER — Other Ambulatory Visit: Payer: Self-pay | Admitting: Cardiology

## 2023-06-01 DIAGNOSIS — I25708 Atherosclerosis of coronary artery bypass graft(s), unspecified, with other forms of angina pectoris: Secondary | ICD-10-CM

## 2023-06-01 DIAGNOSIS — E782 Mixed hyperlipidemia: Secondary | ICD-10-CM

## 2023-06-07 ENCOUNTER — Encounter: Payer: Self-pay | Admitting: Internal Medicine

## 2023-06-07 ENCOUNTER — Ambulatory Visit: Payer: Medicare Other | Attending: Internal Medicine | Admitting: Internal Medicine

## 2023-06-07 VITALS — BP 110/60 | HR 63 | Ht 61.5 in | Wt 127.0 lb

## 2023-06-07 DIAGNOSIS — R6889 Other general symptoms and signs: Secondary | ICD-10-CM

## 2023-06-07 DIAGNOSIS — E1169 Type 2 diabetes mellitus with other specified complication: Secondary | ICD-10-CM | POA: Diagnosis not present

## 2023-06-07 DIAGNOSIS — E785 Hyperlipidemia, unspecified: Secondary | ICD-10-CM

## 2023-06-07 DIAGNOSIS — R5383 Other fatigue: Secondary | ICD-10-CM

## 2023-06-07 DIAGNOSIS — I2510 Atherosclerotic heart disease of native coronary artery without angina pectoris: Secondary | ICD-10-CM

## 2023-06-07 DIAGNOSIS — I25119 Atherosclerotic heart disease of native coronary artery with unspecified angina pectoris: Secondary | ICD-10-CM

## 2023-06-07 DIAGNOSIS — I6523 Occlusion and stenosis of bilateral carotid arteries: Secondary | ICD-10-CM

## 2023-06-07 DIAGNOSIS — I10 Essential (primary) hypertension: Secondary | ICD-10-CM | POA: Diagnosis not present

## 2023-06-07 NOTE — Progress Notes (Signed)
 Cardiology Office Note:  .   Date:  06/07/2023  ID:  Christine [name], DOB 01/25/1951, MRN [id number] PCP: Clarice Nottingham, MD  [facility] Providers Cardiologist:  Lonni Hanson, MD     History of Present Illness: .   Christine [name] is a 73 y.o. female with history of coronary artery disease status post CABG in 2018 (LIMA-LAD, sequential SVG to 2 separate branches of D1, and sequential SVG-OM1-OM2), hyperlipidemia, and type 2 diabetes mellitus, who presents for follow-up of coronary artery disease.  I met her in September after she transitioned her cardiovascular care from Dr. Elmira to our office.  At that time, she complained of intermittent cramping in her chest and both arms that was nonexertional and has been present for at least a year or two.  Discomfort was different than what she experienced leading up to her CABG in 2018.  She also complained of chronic fatigue.  We agreed to perform a pharmacologic MPI and echocardiogram as well as reduce metoprolol .  MPI was normal without evidence of ischemia or scar.  Echo showed normal LVEF with grade 1 diastolic dysfunction and mild to moderate tricuspid regurgitation.  Today, Christine [name] reports that she feels about the same as at our last visit.  She still gets tired easily and sometimes falls asleep when she is sitting still late in the day.  Her husband notes that she usually gets up quite early in the morning and wonders if this is contributing to Christine [name]'s afternoon somnolence.  He reports that Christine [name] snores though he has never heard any apneic episodes.  Christine [name] reports having had sleep study some years ago and was told that it was normal.  She has not had any chest pain, shortness of breath, palpitations, lightheadedness, or edema.  She notes that she is not quite as steady on her feet as she has to be though she has not fallen.  Her only other complaint is of feeling cold on a frequent basis.  She has not had her  thyroid  checked recently.  ROS: See HPI  Studies Reviewed: SABRA   EKG Interpretation Date/Time:  Monday June 07 2023 14:27:23 EST Ventricular Rate:  63 PR Interval:  146 QRS Duration:  80 QT Interval:  414 QTC Calculation: 423 R Axis:   -30  Text Interpretation: Normal sinus rhythm with sinus arrhythmia Left axis deviation Abnormal ECG When compared with ECG of 12-Feb-2023 09:42, No significant change was found Confirmed by Markie Frith, Lonni [phone]) on 06/07/2023 2:32:38 PM    TTE (03/04/2023): Normal LV size and wall thickness.  LVEF 55-60% with normal wall motion and grade 1 diastolic dysfunction.  Normal RV size and function.  Upper normal PA pressure.  Normal biatrial size.  No pericardial effusion.  Mild to moderate tricuspid regurgitation.  Normal CVP.  Carotid Doppler (03/04/2023): Mild bilateral ICA stenoses (1-39%).  Antegrade vertebral artery flow bilaterally.  Suggestion of right subclavian artery stenosis.  Pharmacologic MPI (02/19/2023): Low risk study without evidence of ischemia or scar.  LVEF greater than 65%.  Risk Assessment/Calculations:             Physical Exam:   VS:  BP 110/60 (BP Location: Left Arm, Patient Position: Sitting)   Pulse 63   Ht 5' 1.5 (1.562 m)   Wt 127 lb (57.6 kg)   SpO2 96%   BMI 23.61 kg/m    Wt Readings from Last 3 Encounters:  06/07/23 127 lb (57.6 kg)  02/12/23 130 lb  8 oz (59.2 kg)  07/11/21 138 lb (62.6 kg)    General:  NAD. Neck: No JVD or HJR. Lungs: Clear to auscultation bilaterally without wheezes or crackles. Heart: Regular rate and rhythm without murmurs, rubs, or gallops. Abdomen: Soft, nontender, nondistended. Extremities: No lower extremity edema.  ASSESSMENT AND PLAN: .    Coronary artery disease: Recent Myoview  without evidence of significant ischemia or scar.  No further chest pain reported; defer additional testing at this time.  Continue aspirin  and statin therapy for secondary prevention.  Carotid artery and  right subclavian artery stenosis: Mild bilateral ICA stenoses and turbulent flow in the right subclavian artery noted on recent Doppler.  No symptoms reported.  Continue aspirin  and statin therapy.  Fatigue and cold intolerance: Longstanding and relatively stable since our last visit.  I will check a CBC and TSH today.  If these are unrevealing, Ms. Auth should consider speaking with Dr. Clarice about repeat sleep evaluation to exclude sleep apnea.  No significant improvement with de-escalation of metoprolol  at our last visit.  Hyperlipidemia associated with type 2 diabetes mellitus: Continue atorvastatin .  Patient previously prescribed dapagliflozin but currently not taking.  Ongoing management of DM per Dr. Andree.  Hypertension: Blood pressure well-controlled today.  Continue low-dose metoprolol .    Dispo: Return to clinic in 6 months.  Signed, Lonni Hanson, MD

## 2023-06-07 NOTE — Patient Instructions (Signed)
 Medication Instructions:  Your physician recommends that you continue on your current medications as directed. Please refer to the Current Medication list given to you today.   *If you need a refill on your cardiac medications before your next appointment, please call your pharmacy*   Lab Work: Your provider would like for you to have following labs drawn today (TSH, CBC).     Testing/Procedures: No test ordered today    Follow-Up: At [facility], you and your health needs are our priority.  As part of our continuing mission to provide you with exceptional heart care, we have created designated Provider Care Teams.  These Care Teams include your primary Cardiologist (physician) and Advanced Practice Providers (APPs -  Physician Assistants and Nurse Practitioners) who all work together to provide you with the care you need, when you need it.  We recommend signing up for the patient portal called MyChart.  Sign up information is provided on this After Visit Summary.  MyChart is used to connect with patients for Virtual Visits (Telemedicine).  Patients are able to view lab/test results, encounter notes, upcoming appointments, etc.  Non-urgent messages can be sent to your provider as well.   To learn more about what you can do with MyChart, go to [email or web address].    Your next appointment:   6 month(s)  Provider:   You may see Lonni Hanson, MD or one of the following Advanced Practice Providers on your designated Care Team:   Lonni Meager, NP Bernardino Bring, PA-C Cadence Franchester, PA-C Tylene Lunch, NP Barnie Hila, NP

## 2023-06-08 ENCOUNTER — Encounter: Payer: Self-pay | Admitting: Internal Medicine

## 2023-06-08 DIAGNOSIS — E7800 Pure hypercholesterolemia, unspecified: Secondary | ICD-10-CM | POA: Diagnosis not present

## 2023-06-08 DIAGNOSIS — I25810 Atherosclerosis of coronary artery bypass graft(s) without angina pectoris: Secondary | ICD-10-CM | POA: Diagnosis not present

## 2023-06-08 DIAGNOSIS — R6889 Other general symptoms and signs: Secondary | ICD-10-CM | POA: Insufficient documentation

## 2023-06-08 DIAGNOSIS — E1169 Type 2 diabetes mellitus with other specified complication: Secondary | ICD-10-CM | POA: Diagnosis not present

## 2023-06-08 LAB — CBC
Hematocrit: 40.2 % (ref 34.0–46.6)
Hemoglobin: 12.8 g/dL (ref 11.1–15.9)
MCH: 28.3 pg (ref 26.6–33.0)
MCHC: 31.8 g/dL (ref 31.5–35.7)
MCV: 89 fL (ref 79–97)
Platelets: 296 10*3/uL (ref 150–450)
RBC: 4.52 x10E6/uL (ref 3.77–5.28)
RDW: 13.2 % (ref 11.7–15.4)
WBC: 8.2 10*3/uL (ref 3.4–10.8)

## 2023-06-08 LAB — TSH: TSH: 1.53 u[IU]/mL (ref 0.450–4.500)

## 2023-06-15 DIAGNOSIS — R350 Frequency of micturition: Secondary | ICD-10-CM | POA: Diagnosis not present

## 2023-06-15 DIAGNOSIS — K5901 Slow transit constipation: Secondary | ICD-10-CM | POA: Diagnosis not present

## 2023-06-26 DIAGNOSIS — R059 Cough, unspecified: Secondary | ICD-10-CM | POA: Diagnosis not present

## 2023-06-26 DIAGNOSIS — R509 Fever, unspecified: Secondary | ICD-10-CM | POA: Diagnosis not present

## 2023-06-26 DIAGNOSIS — R0981 Nasal congestion: Secondary | ICD-10-CM | POA: Diagnosis not present

## 2023-06-29 DIAGNOSIS — N816 Rectocele: Secondary | ICD-10-CM | POA: Diagnosis not present

## 2023-07-09 DIAGNOSIS — Z800 Family history of malignant neoplasm of digestive organs: Secondary | ICD-10-CM | POA: Diagnosis not present

## 2023-07-09 DIAGNOSIS — R194 Change in bowel habit: Secondary | ICD-10-CM | POA: Diagnosis not present

## 2023-07-09 DIAGNOSIS — K219 Gastro-esophageal reflux disease without esophagitis: Secondary | ICD-10-CM | POA: Diagnosis not present

## 2023-07-09 DIAGNOSIS — R1310 Dysphagia, unspecified: Secondary | ICD-10-CM | POA: Diagnosis not present

## 2023-07-09 DIAGNOSIS — Z860101 Personal history of adenomatous and serrated colon polyps: Secondary | ICD-10-CM | POA: Diagnosis not present

## 2023-07-09 DIAGNOSIS — K31A0 Gastric intestinal metaplasia, unspecified: Secondary | ICD-10-CM | POA: Diagnosis not present

## 2023-07-19 ENCOUNTER — Ambulatory Visit (INDEPENDENT_AMBULATORY_CARE_PROVIDER_SITE_OTHER): Payer: Medicare Other

## 2023-07-19 ENCOUNTER — Ambulatory Visit: Payer: Medicare Other | Admitting: Podiatry

## 2023-07-19 ENCOUNTER — Encounter: Payer: Self-pay | Admitting: Podiatry

## 2023-07-19 DIAGNOSIS — E1142 Type 2 diabetes mellitus with diabetic polyneuropathy: Secondary | ICD-10-CM | POA: Diagnosis not present

## 2023-07-19 DIAGNOSIS — S90222A Contusion of left lesser toe(s) with damage to nail, initial encounter: Secondary | ICD-10-CM

## 2023-07-19 DIAGNOSIS — L03032 Cellulitis of left toe: Secondary | ICD-10-CM | POA: Diagnosis not present

## 2023-07-19 DIAGNOSIS — M7752 Other enthesopathy of left foot: Secondary | ICD-10-CM

## 2023-07-19 MED ORDER — DOXYCYCLINE HYCLATE 100 MG PO TABS: 100.0000 mg | ORAL_TABLET | Freq: Two times a day (BID) | ORAL | 0 refills | Status: AC

## 2023-07-19 NOTE — Progress Notes (Signed)
Chief Complaint  Patient presents with   Toe Pain    Left great toe, at the base of the nail. Turning dark. The great toe also gets stiff. It is not as puffy today as it was when she made the apt. Last A1c was a moth ago and it was 7.8 and takes ASA 81.    HPI: 73 y.o. female presents today with complaint of swelling and redness to the left great toe with dark spot appearing at the base of her nail.  She denies any known trauma.  States that she changes her shoes often.  She does endorse diabetic neuropathy, last A1c 7.8.  She does state that the appearance of the toe is overall improved somewhat over the past week.    Denies any nausea, vomiting, fever, chills, chest pain, shortness of breath.  Past Medical History:  Diagnosis Date   Angina pectoris (HCC)    Arthritis    CAD, multiple vessel    Cancer (HCC)    18 MONTHS  BASAL CELL    Chest pain ON EXERTION    Diabetes mellitus without complication (HCC)    Dyspnea    W/ EXERTION    GERD (gastroesophageal reflux disease)    H/O echocardiogram 1990   NORMAL   Hyperlipidemia    PAC (premature atrial contraction)    Pre-diabetes     Past Surgical History:  Procedure Laterality Date   BASAL CELL CARCINOMA EXCISION     18 MO OLD   CARDIAC CATHETERIZATION     CATARACT EXTRACTION Right 07/03/2020   colonscopy     CORONARY ARTERY BYPASS GRAFT N/A 04/09/2017   Procedure: CORONARY ARTERY BYPASS GRAFTING (CABG)x5 USING RIGHT SAPHENOUS VEIN, HARVESTED ENDOSCOPICALLY AND LEFT INTERNAL MAMMARY ARTERY;  Surgeon: Loreli Slot, MD;  Location: [facility] OR;  Service: Open Heart Surgery;  Laterality: N/A;   DRUG INDUCED ENDOSCOPY     LEFT HEART CATH AND CORONARY ANGIOGRAPHY N/A 04/01/2017   Procedure: LEFT HEART CATH AND CORONARY ANGIOGRAPHY;  Surgeon: Elder Negus, MD;  Location: [facility] INVASIVE CV LAB;  Service: Cardiovascular;  Laterality: N/A;   LEFT HEART CATH AND CORS/GRAFTS ANGIOGRAPHY N/A 04/25/2019   Procedure: LEFT  HEART CATH AND CORS/GRAFTS ANGIOGRAPHY;  Surgeon: Elder Negus, MD;  Location: [facility];  Service: Cardiovascular;  Laterality: N/A;   TEE WITHOUT CARDIOVERSION N/A 04/09/2017   Procedure: TRANSESOPHAGEAL ECHOCARDIOGRAM (TEE);  Surgeon: Loreli Slot, MD;  Location: [facility] OR;  Service: Open Heart Surgery;  Laterality: N/A;    No Known Allergies  ROS negative except as stated in HPI   Physical Exam: There were no vitals filed for this visit.  General: The patient is alert and oriented x3 in no acute distress.  Dermatology: Mild erythema and edema to the left first toe distal phalanx.  No focal warmth increased.  No significant tenderness on palpation.  Evidence of some mild subungual hematoma present base of the toenail.  Pedal skin atrophic.  Vascular: Faintly palpable DP pulses, nonpalpable PT pulses, capillary refill 3 to 5 seconds to the digits.  Pedal hair growth absent.  No appreciable edema.  Telangiectasias appreciated  Neurological: Light touch sensation grossly intact bilateral feet.  Protective sensation decreased  Musculoskeletal Exam: No pedal deformities noted  Radiographic Exam: Left foot 3 views 07/19/2023 Normal osseous mineralization. Joint spaces preserved.  No fractures or osseous irregularities noted.  No signs of osteolysis or cortical erosion.  No soft tissue edema.  Assessment/Plan of Care: 1. Cellulitis of toe of left foot   2. Contusion of toenail of left foot, initial encounter   3. DM type 2 with diabetic peripheral neuropathy (HCC)      Meds ordered this encounter  Medications   doxycycline (VIBRA-TABS) 100 MG tablet    Sig: Take 1 tablet (100 mg total) by mouth 2 (two) times daily for 7 days.    Dispense:  14 tablet    Refill:  0   None  Discussed clinical findings with patient today.  Plan: -Radiographs reviewed with patient -7 days of doxycycline 100 mg twice daily ordered for patient out of abundance of caution for some  possible underlying cellulitis, due to patient's diabetes with diminished pulses -Do suspect likely nail contusion left hallux. - Recommend use of appropriately fitting stiff soled shoe limit motion of the toe with soft mesh upper. -Instructed patient to monitor site closely for any changes -Instructed patient to return to clinic within 2 weeks if symptoms worsen and after taking antibiotics, otherwise follow-up in 3 months for diabetic foot screening due to likely PAD and diabetic neuropathy.  Nails within normal limits today and did not require any debridement.   Earnest Mcgillis L. Marchia Bond, AACFAS [facility]     [address] [place], [place] [address]                Office [phone]  Fax [phone]

## 2023-07-27 ENCOUNTER — Ambulatory Visit: Payer: Medicare HMO | Admitting: Obstetrics

## 2023-07-27 DIAGNOSIS — N3281 Overactive bladder: Secondary | ICD-10-CM | POA: Diagnosis not present

## 2023-07-27 DIAGNOSIS — Z4689 Encounter for fitting and adjustment of other specified devices: Secondary | ICD-10-CM | POA: Diagnosis not present

## 2023-08-02 ENCOUNTER — Encounter: Payer: Self-pay | Admitting: Podiatry

## 2023-08-02 ENCOUNTER — Ambulatory Visit: Payer: Medicare Other | Admitting: Podiatry

## 2023-08-02 DIAGNOSIS — E1142 Type 2 diabetes mellitus with diabetic polyneuropathy: Secondary | ICD-10-CM | POA: Diagnosis not present

## 2023-08-02 DIAGNOSIS — M2041 Other hammer toe(s) (acquired), right foot: Secondary | ICD-10-CM | POA: Diagnosis not present

## 2023-08-02 DIAGNOSIS — M2042 Other hammer toe(s) (acquired), left foot: Secondary | ICD-10-CM

## 2023-08-02 DIAGNOSIS — M79672 Pain in left foot: Secondary | ICD-10-CM | POA: Diagnosis not present

## 2023-08-02 NOTE — Progress Notes (Unsigned)
 Chief Complaint  Patient presents with   Cellulitis of left toe    Patient returns today for the redness in the left great toe, with new complaints of her heel hurting. There is tingling and numbness in the the feet. Last A1c was 7.3 in Jan. Takes ASA 25    HPI: 73 y.o. female presents for follow-up evaluation of left first toe cellulitis.  She completed course of antibiotics.  She is doing well.  She does endorse diabetic neuropathy, last A1c 7.8.  She does state that the appearance of the toe is overall improved somewhat over the past week.  She does note some mild left heel pain that she has noticed for the past couple weeks, primarily notices it in the evening and states that it gets sore.  Also complains of hammertoe left fourth digit which causes pain to the adjacent third toe as the nail grows out.  Denies any nausea, vomiting, fever, chills, chest pain, shortness of breath.  Past Medical History:  Diagnosis Date   Angina pectoris (HCC)    Arthritis    CAD, multiple vessel    Cancer (HCC)    18 MONTHS  BASAL CELL    Chest pain ON EXERTION    Diabetes mellitus without complication (HCC)    Dyspnea    W/ EXERTION    GERD (gastroesophageal reflux disease)    H/O echocardiogram 1990   NORMAL   Hyperlipidemia    PAC (premature atrial contraction)    Pre-diabetes     Past Surgical History:  Procedure Laterality Date   BASAL CELL CARCINOMA EXCISION     18 MO OLD   CARDIAC CATHETERIZATION     CATARACT EXTRACTION Right 07/03/2020   colonscopy     CORONARY ARTERY BYPASS GRAFT N/A 04/09/2017   Procedure: CORONARY ARTERY BYPASS GRAFTING (CABG)x5 USING RIGHT SAPHENOUS VEIN, HARVESTED ENDOSCOPICALLY AND LEFT INTERNAL MAMMARY ARTERY;  Surgeon: Loreli Slot, MD;  Location: [facility] OR;  Service: Open Heart Surgery;  Laterality: N/A;   DRUG INDUCED ENDOSCOPY     LEFT HEART CATH AND CORONARY ANGIOGRAPHY N/A 04/01/2017   Procedure: LEFT HEART CATH AND CORONARY ANGIOGRAPHY;   Surgeon: Elder Negus, MD;  [facility]: [facility];  Service: Cardiovascular;  Laterality: N/A;   LEFT HEART CATH AND CORS/GRAFTS ANGIOGRAPHY N/A 04/25/2019   Procedure: LEFT HEART CATH AND CORS/GRAFTS ANGIOGRAPHY;  Surgeon: Elder Negus, MD;  Location: [facility];  Service: Cardiovascular;  Laterality: N/A;   TEE WITHOUT CARDIOVERSION N/A 04/09/2017   Procedure: TRANSESOPHAGEAL ECHOCARDIOGRAM (TEE);  Surgeon: Loreli Slot, MD;  Location: [facility] OR;  Service: Open Heart Surgery;  Laterality: N/A;    No Known Allergies  ROS negative except as stated in HPI   Physical Exam: There were no vitals filed for this visit.  General: The patient is alert and oriented x3 in no acute distress.  Dermatology: Pedal skin dry and atrophic.  Webspaces clean and dry.  Left first toe resolved redness and swelling.  Vascular: Faintly palpable DP pulses, nonpalpable PT pulses, capillary refill 3 to 5 seconds to the digits.  Skin temperature gradient warm to cool pedal hair growth absent.  No appreciable edema.  Telangiectasias appreciated  Neurological: Light touch sensation grossly intact bilateral feet.  Protective sensation decreased  Musculoskeletal Exam: Left foot hallux limitus noted.  Hammertoe of fourth and fifth toe.  Reducible.  Mild tenderness on palpation of left heel central aspect.  No pain along  the plantar fascia or at its insertion  Radiographic Exam: Left foot 3 views 07/19/2023 Normal osseous mineralization. Joint spaces preserved.  No fractures or osseous irregularities noted.  No signs of osteolysis or cortical erosion.  No soft tissue edema.  Assessment/Plan of Care: 1. DM type 2 with diabetic peripheral neuropathy (HCC)   2. Acquired hammertoes of both feet   3. Pain of left heel      No orders of the defined types were placed in this encounter.  None  Discussed clinical findings with patient today.  Plan: -Left hallux cellulitis appears  resolved. -Patient inquiring about treatment options for the hammertoe.  Did discuss shoe choices and modifications and use of pads to hold toe straight.   Christine [name], AACFAS [facility]     [address] [place], [place] [address]                Office [phone]  Fax [phone]    Faint pulses. Left first toe appears improved, no signs of infection today First MPJ stiffness Some mild left heel pain, not quite plantar fasciitis.  Heel pad and stretching discussed with over-the-counter NSAIDs  Does complain of left fourth toe hammertoe but does curl into the third toe.  Crest pad dispensed today.  Did discuss flexor tenotomy.  Otherwise follow-up  for [facility]

## 2023-08-02 NOTE — Patient Instructions (Signed)
 More silicone and pads can be purchased from:  [email or web address]  Look for gel Crest hammertoe pads and felt heel pad

## 2023-08-05 ENCOUNTER — Other Ambulatory Visit: Payer: Self-pay | Admitting: Internal Medicine

## 2023-08-09 DIAGNOSIS — Z961 Presence of intraocular lens: Secondary | ICD-10-CM | POA: Diagnosis not present

## 2023-08-09 DIAGNOSIS — H35373 Puckering of macula, bilateral: Secondary | ICD-10-CM | POA: Diagnosis not present

## 2023-08-09 DIAGNOSIS — H5201 Hypermetropia, right eye: Secondary | ICD-10-CM | POA: Diagnosis not present

## 2023-08-09 DIAGNOSIS — E119 Type 2 diabetes mellitus without complications: Secondary | ICD-10-CM | POA: Diagnosis not present

## 2023-08-09 DIAGNOSIS — H5212 Myopia, left eye: Secondary | ICD-10-CM | POA: Diagnosis not present

## 2023-08-12 DIAGNOSIS — M180 Bilateral primary osteoarthritis of first carpometacarpal joints: Secondary | ICD-10-CM | POA: Diagnosis not present

## 2023-08-16 DIAGNOSIS — H43813 Vitreous degeneration, bilateral: Secondary | ICD-10-CM | POA: Diagnosis not present

## 2023-08-16 DIAGNOSIS — H43393 Other vitreous opacities, bilateral: Secondary | ICD-10-CM | POA: Diagnosis not present

## 2023-08-16 DIAGNOSIS — H35373 Puckering of macula, bilateral: Secondary | ICD-10-CM | POA: Diagnosis not present

## 2023-09-30 DIAGNOSIS — E113211 Type 2 diabetes mellitus with mild nonproliferative diabetic retinopathy with macular edema, right eye: Secondary | ICD-10-CM | POA: Diagnosis not present

## 2023-09-30 DIAGNOSIS — E113511 Type 2 diabetes mellitus with proliferative diabetic retinopathy with macular edema, right eye: Secondary | ICD-10-CM | POA: Diagnosis not present

## 2023-09-30 DIAGNOSIS — H33331 Multiple defects of retina without detachment, right eye: Secondary | ICD-10-CM | POA: Diagnosis not present

## 2023-09-30 DIAGNOSIS — H35371 Puckering of macula, right eye: Secondary | ICD-10-CM | POA: Diagnosis not present

## 2023-10-11 DIAGNOSIS — H35371 Puckering of macula, right eye: Secondary | ICD-10-CM | POA: Diagnosis not present

## 2023-10-12 ENCOUNTER — Encounter: Payer: Self-pay | Admitting: Podiatry

## 2023-10-12 ENCOUNTER — Ambulatory Visit: Admitting: Podiatry

## 2023-10-12 DIAGNOSIS — E1142 Type 2 diabetes mellitus with diabetic polyneuropathy: Secondary | ICD-10-CM

## 2023-10-12 DIAGNOSIS — B351 Tinea unguium: Secondary | ICD-10-CM

## 2023-10-12 DIAGNOSIS — M79675 Pain in left toe(s): Secondary | ICD-10-CM | POA: Diagnosis not present

## 2023-10-12 DIAGNOSIS — M79674 Pain in right toe(s): Secondary | ICD-10-CM

## 2023-10-12 NOTE — Progress Notes (Unsigned)
  Subjective:  Patient ID: Christine [name], female    DOB: 1951-04-18,  MRN: [id number]  Chief Complaint  Patient presents with   [facility]    [facility] with out callous. Last A1c was 7 ans she goes next week for follow up blood work, and takes ASA 43.    73 y.o. female presents with the above complaint. History confirmed with patient. Patient presenting with pain related to dystrophic thickened elongated nails. Patient is unable to trim own nails related to nail dystrophy and/or mobility issues. Patient does have a history of T2DM.  Last A1c was 7.  Does endorse numbness and tingling to her feet.  Objective:  Physical Exam: warm, good capillary refill, pedal skin atrophic, diminished pedal hair growth nail exam onychomycosis of the toenails, onycholysis, and dystrophic nails DP pulses faintly palpable, PT pulses nonpalpable, telangiectasias present and protective sensation absent Left Foot:  Pain with palpation of nails due to elongation and dystrophic growth.  Mild prominence of dorsal medial first MPJ minimal pain on palpation Right Foot: Pain with palpation of nails due to elongation and dystrophic growth.   Assessment:   1. DM type 2 with diabetic peripheral neuropathy (HCC)   2. Pain due to onychomycosis of toenails of both feet      Plan:  Patient was evaluated and treated and all questions answered.  #Onychomycosis with pain  -Nails palliatively debrided as below. -Educated on self-care  Procedure: Nail Debridement Rationale: Pain Type of Debridement: manual, sharp debridement. Instrumentation: Nail nipper, rotary burr. Number of Nails: 10  Patient educated on diabetes. Discussed proper diabetic foot care and discussed risks and complications of disease. Educated patient in depth on reasons to return to the office immediately should he/she discover anything concerning or new on the feet. All questions answered. Discussed proper shoes as well.    Return in about 3 months (around  01/12/2024) for Diabetic Foot Care.         Eve Hinders, DPM [facility] / [facility]

## 2023-10-14 DIAGNOSIS — E7800 Pure hypercholesterolemia, unspecified: Secondary | ICD-10-CM | POA: Diagnosis not present

## 2023-10-14 DIAGNOSIS — I25810 Atherosclerosis of coronary artery bypass graft(s) without angina pectoris: Secondary | ICD-10-CM | POA: Diagnosis not present

## 2023-10-14 DIAGNOSIS — E1165 Type 2 diabetes mellitus with hyperglycemia: Secondary | ICD-10-CM | POA: Diagnosis not present

## 2023-10-14 DIAGNOSIS — E1169 Type 2 diabetes mellitus with other specified complication: Secondary | ICD-10-CM | POA: Diagnosis not present

## 2023-10-20 ENCOUNTER — Other Ambulatory Visit: Payer: Self-pay | Admitting: Internal Medicine

## 2023-11-10 DIAGNOSIS — H35371 Puckering of macula, right eye: Secondary | ICD-10-CM | POA: Diagnosis not present

## 2023-11-10 DIAGNOSIS — H33331 Multiple defects of retina without detachment, right eye: Secondary | ICD-10-CM | POA: Diagnosis not present

## 2023-11-10 DIAGNOSIS — H35372 Puckering of macula, left eye: Secondary | ICD-10-CM | POA: Diagnosis not present

## 2023-11-18 ENCOUNTER — Ambulatory Visit: Payer: Medicare Other | Admitting: Internal Medicine

## 2023-11-24 ENCOUNTER — Ambulatory Visit: Payer: Medicare Other | Attending: Internal Medicine | Admitting: Internal Medicine

## 2023-11-24 NOTE — Progress Notes (Deleted)
  Cardiology Office Note:  .   Date:  11/24/2023  ID:  Christine [name], DOB Mar 10, 1951, MRN [id number] PCP: Clarice Nottingham, MD  [facility] Providers Cardiologist:  Lonni Hanson, MD { Click to update primary MD,subspecialty MD or APP then REFRESH:1}    History of Present Illness: .   Christine [name] is a 73 y.o. female with history of coronary artery disease status post CABG in 2018 (LIMA-LAD, sequential SVG to 2 separate branches of D1, and sequential SVG-OM1-OM2), hyperlipidemia, and type 2 diabetes mellitus, who presents for follow-up of coronary artery disease.  I last saw her in January, which time she complained of significant fatigue.  She denied chest pain and shortness of breath.  Preceding Myoview  was negative for ischemia or scar.  We checked thyroid  function tests and CBC, which were unremarkable.  ROS: See HPI  Studies Reviewed: SABRA        TTE (03/04/2023): Normal LV size and wall thickness.  LVEF 55-60% with normal wall motion and grade 1 diastolic dysfunction.  Normal RV size and function.  Upper normal PA pressure.  Normal biatrial size.  No pericardial effusion.  Mild to moderate tricuspid regurgitation.  Normal CVP.   Carotid Doppler (03/04/2023): Mild bilateral ICA stenoses (1-39%).  Antegrade vertebral artery flow bilaterally.  Suggestion of right subclavian artery stenosis.   Pharmacologic MPI (02/19/2023): Low risk study without evidence of ischemia or scar.  LVEF greater than 65%.  Risk Assessment/Calculations:     No BP recorded.  {Refresh Note OR Click here to enter BP  :1}***       Physical Exam:   VS:  There were no vitals taken for this visit.   Wt Readings from Last 3 Encounters:  06/07/23 127 lb (57.6 kg)  02/12/23 130 lb 8 oz (59.2 kg)  07/11/21 138 lb (62.6 kg)    General:  NAD. Neck: No JVD or HJR. Lungs: Clear to auscultation bilaterally without wheezes or crackles. Heart: Regular rate and rhythm without murmurs, rubs, or gallops. Abdomen:  Soft, nontender, nondistended. Extremities: No lower extremity edema.  ASSESSMENT AND PLAN: .    ***    {Are you ordering a CV Procedure (e.g. stress test, cath, DCCV, TEE, etc)?   Press F2        :[id number]}  Dispo: ***  Signed, Lonni Hanson, MD

## 2023-12-02 DIAGNOSIS — Z800 Family history of malignant neoplasm of digestive organs: Secondary | ICD-10-CM | POA: Diagnosis not present

## 2023-12-02 DIAGNOSIS — Z860101 Personal history of adenomatous and serrated colon polyps: Secondary | ICD-10-CM | POA: Diagnosis not present

## 2023-12-02 DIAGNOSIS — R194 Change in bowel habit: Secondary | ICD-10-CM | POA: Diagnosis not present

## 2023-12-02 DIAGNOSIS — K2950 Unspecified chronic gastritis without bleeding: Secondary | ICD-10-CM | POA: Diagnosis not present

## 2023-12-02 DIAGNOSIS — K449 Diaphragmatic hernia without obstruction or gangrene: Secondary | ICD-10-CM | POA: Diagnosis not present

## 2023-12-02 DIAGNOSIS — K635 Polyp of colon: Secondary | ICD-10-CM | POA: Diagnosis not present

## 2023-12-02 DIAGNOSIS — K5730 Diverticulosis of large intestine without perforation or abscess without bleeding: Secondary | ICD-10-CM | POA: Diagnosis not present

## 2023-12-02 DIAGNOSIS — D122 Benign neoplasm of ascending colon: Secondary | ICD-10-CM | POA: Diagnosis not present

## 2023-12-02 DIAGNOSIS — K31A0 Gastric intestinal metaplasia, unspecified: Secondary | ICD-10-CM | POA: Diagnosis not present

## 2023-12-02 DIAGNOSIS — Z79899 Other long term (current) drug therapy: Secondary | ICD-10-CM | POA: Diagnosis not present

## 2023-12-02 DIAGNOSIS — Z7982 Long term (current) use of aspirin: Secondary | ICD-10-CM | POA: Diagnosis not present

## 2023-12-02 DIAGNOSIS — K219 Gastro-esophageal reflux disease without esophagitis: Secondary | ICD-10-CM | POA: Diagnosis not present

## 2023-12-07 DIAGNOSIS — K5901 Slow transit constipation: Secondary | ICD-10-CM | POA: Diagnosis not present

## 2023-12-07 DIAGNOSIS — R350 Frequency of micturition: Secondary | ICD-10-CM | POA: Diagnosis not present

## 2023-12-17 ENCOUNTER — Encounter: Payer: Self-pay | Admitting: Advanced Practice Midwife

## 2023-12-23 DIAGNOSIS — H00011 Hordeolum externum right upper eyelid: Secondary | ICD-10-CM | POA: Diagnosis not present

## 2023-12-31 DIAGNOSIS — Z1231 Encounter for screening mammogram for malignant neoplasm of breast: Secondary | ICD-10-CM | POA: Diagnosis not present

## 2024-01-10 DIAGNOSIS — E7800 Pure hypercholesterolemia, unspecified: Secondary | ICD-10-CM | POA: Diagnosis not present

## 2024-01-10 DIAGNOSIS — E559 Vitamin D deficiency, unspecified: Secondary | ICD-10-CM | POA: Diagnosis not present

## 2024-01-11 ENCOUNTER — Ambulatory Visit: Admitting: Podiatry

## 2024-01-11 DIAGNOSIS — M79675 Pain in left toe(s): Secondary | ICD-10-CM

## 2024-01-11 DIAGNOSIS — B351 Tinea unguium: Secondary | ICD-10-CM | POA: Diagnosis not present

## 2024-01-11 DIAGNOSIS — E1142 Type 2 diabetes mellitus with diabetic polyneuropathy: Secondary | ICD-10-CM | POA: Diagnosis not present

## 2024-01-11 DIAGNOSIS — M79674 Pain in right toe(s): Secondary | ICD-10-CM

## 2024-01-11 NOTE — Patient Instructions (Addendum)
 Hallux Limitus Hallux limitus is a condition involving pain and a loss of motion of the first (big) toe. The pain gets worse with lifting up (extension) of the toe. This is usually due to arthritic bony bumps (spurring) of the joint at the base of the big toe.  SYMPTOMS  Pain, with lifting up of the toe. Tenderness over the joint where the big toe meets the foot. Redness, swelling, and warmth over the top of the base of the big toe (sometimes). Foot pain, stiffness, and limping. CAUSES  Hallux limitus is caused by arthritis of the joint where the big toe meets the foot. The arthritis creates a bone spur that pinches the soft tissues when the toe is extended. RISK INCREASES WITH: Tight shoes with a narrow toe box. Family history of foot problems. Gout and rheumatoid and psoriatic arthritis. History of previous toe injury, including turf toe. Long first toe, flat feet, and other big toe bony bumps. Arthritis of the big toe. PREVENTION  Wear wide-toed shoes that fit well. Tape the big toe to reduce motion and to prevent pinching of the tissues between the bone. Maintain physical fitness: Foot and ankle flexibility. Muscle strength and endurance. PROGNOSIS  This condition can usually be managed with proper treatment. However, surgery is typically required to prevent the problem from recurring.  RELATED COMPLICATIONS  Injury to other areas of the foot or ankle, caused by abnormal walking in an attempt to avoid the pain felt when walking normally. TREATMENT Treatment first involves stopping the activities that aggravate your symptoms. Ice and medicine can be used to reduce the pain and inflammation. Modifications to shoes may help reduce pain, including wearing stiff-soled shoes, shoes with a wide toe box, inserting a padded donut to relieve pressure on top of the joint, or wearing an arch support. Corticosteroid injections may be given to reduce inflammation. If nonsurgical treatment is  unsuccessful, surgery may be needed. Surgical options include removing the arthritic bony spur, cutting a bone in the foot to change the arc of motion (allowing the toe to extend more), or fusion of the joint (eliminating all motion in the joint at the base of the big toe).  MEDICATION  If pain medicine is needed, nonsteroidal anti-inflammatory medicines (aspirin  and ibuprofen), or other minor pain relievers (acetaminophen ), are often advised. Do not take pain medicine for 7 days before surgery. Prescription pain relievers are usually prescribed only after surgery. Use only as directed and only as much as you need. Ointments for arthritis, applied to the skin, may give some relief. Injections of corticosteroids may be given to reduce inflammation. HEAT AND COLD Cold treatment (icing) relieves pain and reduces inflammation. Cold treatment should be applied for 10 to 15 minutes every 2 to 3 hours, and immediately after activity that aggravates your symptoms. Use ice packs or an ice massage. Heat treatment may be used before performing the stretching and strengthening activities prescribed by your caregiver, physical therapist, or athletic trainer. Use a heat pack or a warm water soak. SEEK MEDICAL CARE IF:  Symptoms get worse or do not improve in 2 weeks, despite treatment. After surgery you develop fever, increasing pain, redness, swelling, drainage of fluids, bleeding, or increasing warmth. New, unexplained symptoms develop.    Look for urea 40% cream or ointment and apply to the thickened dry skin / calluses. This can be bought over the counter, at a pharmacy or online such as [employer].  More silicone pads can be purchased from:  [email or web address]  These can also be bought off of [employer]. Look for Crest pads for hammertoes and gel toe caps

## 2024-01-12 ENCOUNTER — Encounter: Payer: Self-pay | Admitting: Podiatry

## 2024-01-12 ENCOUNTER — Other Ambulatory Visit: Payer: Self-pay | Admitting: Internal Medicine

## 2024-01-12 NOTE — Progress Notes (Signed)
  Subjective:  Patient ID: Christine [name], female    DOB: 29-Jul-1950,  MRN: [id number]  Chief Complaint  Patient presents with   [facility]    [facility]  with out callous.  The hallux nail on the left foot fell off last week.   Last A1c was 7.1 in May, takes ASA    73 y.o. female presents with the above complaint. History confirmed with patient. Patient presenting with pain related to dystrophic thickened elongated nails. Patient is unable to trim own nails related to nail dystrophy and/or mobility issues. Patient does have a history of T2DM.  Last A1c was 7.1.  Does endorse numbness and tingling to her feet.  Objective:  Physical Exam: warm, good capillary refill, pedal skin atrophic, diminished pedal hair growth nail exam onychomycosis of the toenails, onycholysis, and dystrophic nails DP pulses faintly palpable, PT pulses nonpalpable, telangiectasias present and protective sensation absent Left Foot:  Pain with palpation of nails due to elongation and dystrophic growth.   Right Foot: Pain with palpation of nails due to elongation and dystrophic growth.   Assessment:   1. DM type 2 with diabetic peripheral neuropathy (HCC)   2. Pain due to onychomycosis of toenails of both feet      Plan:  Patient was evaluated and treated and all questions answered.  #Onychomycosis with pain  -Nails palliatively debrided as below. -Educated on self-care  Procedure: Nail Debridement Rationale: Pain Type of Debridement: manual, sharp debridement. Instrumentation: Nail nipper, rotary burr. Number of Nails: 10  Patient educated on diabetes. Discussed proper diabetic foot care and discussed risks and complications of disease. Educated patient in depth on reasons to return to the office immediately should he/she discover anything concerning or new on the feet. All questions answered. Discussed proper shoes as well.    Return in about 3 months (around 04/12/2024) for Diabetic Foot Care.         Ethan Saddler,  DPM [facility] / [facility]

## 2024-01-14 DIAGNOSIS — E559 Vitamin D deficiency, unspecified: Secondary | ICD-10-CM | POA: Diagnosis not present

## 2024-01-14 DIAGNOSIS — I25810 Atherosclerosis of coronary artery bypass graft(s) without angina pectoris: Secondary | ICD-10-CM | POA: Diagnosis not present

## 2024-01-14 DIAGNOSIS — M810 Age-related osteoporosis without current pathological fracture: Secondary | ICD-10-CM | POA: Diagnosis not present

## 2024-01-14 DIAGNOSIS — Z0000 Encounter for general adult medical examination without abnormal findings: Secondary | ICD-10-CM | POA: Diagnosis not present

## 2024-01-14 DIAGNOSIS — E1165 Type 2 diabetes mellitus with hyperglycemia: Secondary | ICD-10-CM | POA: Diagnosis not present

## 2024-01-14 DIAGNOSIS — M85859 Other specified disorders of bone density and structure, unspecified thigh: Secondary | ICD-10-CM | POA: Diagnosis not present

## 2024-01-14 DIAGNOSIS — K219 Gastro-esophageal reflux disease without esophagitis: Secondary | ICD-10-CM | POA: Diagnosis not present

## 2024-02-08 DIAGNOSIS — K5901 Slow transit constipation: Secondary | ICD-10-CM | POA: Diagnosis not present

## 2024-02-08 DIAGNOSIS — N3281 Overactive bladder: Secondary | ICD-10-CM | POA: Diagnosis not present

## 2024-02-16 ENCOUNTER — Telehealth: Payer: Self-pay | Admitting: Internal Medicine

## 2024-02-16 NOTE — Telephone Encounter (Signed)
   Name: Christine [name]  DOB: 1950-06-15  MRN: [id number]  Primary Cardiologist: Lonni Hanson, MD  Chart reviewed as part of pre-operative protocol coverage. Because of Christine [name]'s past medical history and time since last visit, she will require a follow-up in-office visit in order to better assess preoperative cardiovascular risk. Pt is due for 6 month follow-up.   Pre-op covering staff: - Please schedule appointment and call patient to inform them. If patient already had an upcoming appointment within acceptable timeframe, please add pre-op clearance to the appointment notes so provider is aware. - Please contact requesting surgeon's office via preferred method (i.e, phone, fax) to inform them of need for appointment prior to surgery.   Damien JAYSON Braver, NP  02/16/2024, 1:40 PM

## 2024-02-16 NOTE — Telephone Encounter (Signed)
 1st attempt : Called patient, NA, left message on VM.

## 2024-02-16 NOTE — Telephone Encounter (Signed)
   Pre-operative Risk Assessment    Patient Name: Christine [name]  DOB: 08/12/1950 MRN: [id number]   Date of last office visit: 06/07/23 Date of next office visit: 03/29/24   Request for Surgical Clearance    Procedure:  Fixation Sacrospinous Ligament, Suspension Bladder Transvaginal Tape  Date of Surgery:  Clearance 04/24/24                                Surgeon:  n/a Surgeon's Group or Practice Name:  n/a Phone number:  [phone] Fax number:  [phone]   Type of Clearance Requested:  medical    Type of Anesthesia:  Not Indicated   Additional requests/questions:    Signed, Arsenio Celine GAILS   02/16/2024, 12:18 PM

## 2024-02-17 NOTE — Telephone Encounter (Signed)
 Surgeon : Alberta Kitty, MD  Patient called back and stated that she already has a scheduled appointment with Dr. Mady on 03/29/2024. I will document on appointment notes.

## 2024-02-18 ENCOUNTER — Encounter: Payer: Self-pay | Admitting: Internal Medicine

## 2024-02-18 ENCOUNTER — Ambulatory Visit: Attending: Internal Medicine | Admitting: Internal Medicine

## 2024-02-18 VITALS — BP 105/57 | HR 66 | Ht 61.0 in | Wt 133.4 lb

## 2024-02-18 DIAGNOSIS — E1169 Type 2 diabetes mellitus with other specified complication: Secondary | ICD-10-CM | POA: Diagnosis not present

## 2024-02-18 DIAGNOSIS — I2510 Atherosclerotic heart disease of native coronary artery without angina pectoris: Secondary | ICD-10-CM | POA: Diagnosis not present

## 2024-02-18 DIAGNOSIS — Z01810 Encounter for preprocedural cardiovascular examination: Secondary | ICD-10-CM | POA: Diagnosis not present

## 2024-02-18 DIAGNOSIS — E785 Hyperlipidemia, unspecified: Secondary | ICD-10-CM | POA: Diagnosis not present

## 2024-02-18 MED ORDER — ASPIRIN EC 81 MG PO TBEC: 81.0000 mg | DELAYED_RELEASE_TABLET | Freq: Every evening | ORAL | Status: AC

## 2024-02-18 MED ORDER — NITROGLYCERIN 0.4 MG SL SUBL: 0.4000 mg | SUBLINGUAL_TABLET | SUBLINGUAL | 3 refills | Status: AC | PRN

## 2024-02-18 NOTE — Patient Instructions (Signed)
 Medication Instructions:  Your physician recommends the following medication changes.  STOP TAKING: Metoprolol   START TAKING: Aspirin  81 mg by mouth daily    *If you need a refill on your cardiac medications before your next appointment, please call your pharmacy*  Lab Work: No labs ordered today    Testing/Procedures: No test ordered today   Follow-Up: At [facility], you and your health needs are our priority.  As part of our continuing mission to provide you with exceptional heart care, our providers are all part of one team.  This team includes your primary Cardiologist (physician) and Advanced Practice Providers or APPs (Physician Assistants and Nurse Practitioners) who all work together to provide you with the care you need, when you need it.  Your next appointment:   1 year(s)  Provider:   You may see Lonni Hanson, MD or one of the following Advanced Practice Providers on your designated Care Team:   Lonni Meager, NP Lesley Maffucci, PA-C Bernardino Bring, PA-C Cadence [place], PA-C Tylene Lunch, NP Barnie Hila, NP

## 2024-02-18 NOTE — Progress Notes (Signed)
 Cardiology Office Note:  .   Date:  02/18/2024  ID:  Christine [name], DOB December 27, 1950, MRN [id number] PCP: Christine Nottingham, MD  [facility] Providers Cardiologist:  Christine Hanson, MD     History of Present Illness: .   Christine [name] is a 73 y.o. female with history of coronary artery disease status post CABG in 2018 (LIMA-LAD, sequential SVG to two separate branches of D1, and sequential SVG-OM1-OM2), hyperlipidemia, type 2 diabetes mellitus, who presents for follow-up of coronary artery disease.  I last saw her in January, at which time she noted some fatigue but otherwise was feeling fairly well.  Today, she reports that her energy has been fairly stable.  She denies shortness of breath, palpitations, and edema.  She has sporadic episodes of lightheadedness but has not passed out or fallen.  She has occasional episodes of transient chest pain lasting a second or two that is not exertional or worse reminiscent of what she experienced before her CABG.  She notes that she has stopped taking aspirin  because of bruising.  She has not had any significant bleeding.  She is scheduled for gynecologic surgery.  ROS: See HPI  Studies Reviewed: SABRA   EKG Interpretation Date/Time:  Friday February 18 2024 10:45:57 EDT Ventricular Rate:  66 PR Interval:  146 QRS Duration:  84 QT Interval:  414 QTC Calculation: 434 R Axis:   -27  Text Interpretation: Normal sinus rhythm Normal ECG When compared with ECG of 07-Jun-2023 14:27, No significant change was found Confirmed by [place] Prout, Christine [phone]) on 02/18/2024 9:18:46 PM    Risk Assessment/Calculations:             Physical Exam:   VS:  BP (!) 105/57 (BP Location: Left Arm, Patient Position: Sitting, Cuff Size: Normal)   Pulse 66   Ht 5' 1 (1.549 m)   Wt 133 lb 6.4 oz (60.5 kg)   SpO2 98%   BMI 25.21 kg/m    Wt Readings from Last 3 Encounters:  02/18/24 133 lb 6.4 oz (60.5 kg)  06/07/23 127 lb (57.6 kg)  02/12/23 130 lb 8 oz (59.2  kg)    General:  NAD. Neck: No JVD or HJR. Lungs: Clear to auscultation bilaterally without wheezes or crackles. Heart: Regular rate and rhythm without murmurs, rubs, or gallops. Abdomen: Soft, nontender, nondistended. Extremities: No lower extremity edema.  ASSESSMENT AND PLAN: .    Coronary artery disease: [name] is doing well without any further angina.  Her rare episodes of transient chest pain lasting only a few seconds is unlikely to be cardiac in nature.  I have asked her to resume aspirin  81 mg daily for secondary prevention, though this can be held if needed for her upcoming gynecologic surgery.  Continue atorvastatin  40 mg daily for target LDL less than 70.  Given her borderline low blood pressure, we have agreed to discontinue metoprolol .  Hyperlipidemia associated with type 2 diabetes mellitus: Continue atorvastatin  40 mg daily for target LDL less than 70.  Ongoing follow-up of lipids as well as management of DM per Dr. Imagene office.  Preoperative cardiovascular risk assessment: [name] is scheduled for urogynecologic surgery in November.  She does not report any unstable cardiac symptoms and is able to complete more than 4 METS of activity without difficulty.  She is low risk for elective urogynecologic surgery and can proceed without further testing/intervention.  If possible, aspirin  81 mg daily should be continued in the perioperative period.  However, if  this poses significant bleeding risk, it is reasonable to hold aspirin  for 5 days before the procedure and to resume it when it is felt safe to do so by her surgeon.    Dispo: Return to clinic in 1 year.  Signed, Christine Hanson, MD

## 2024-02-22 NOTE — Telephone Encounter (Signed)
See notes in chart.

## 2024-02-22 NOTE — Telephone Encounter (Signed)
-----   Message from Damien JAYSON Braver sent at 02/21/2024 11:45 AM EDT ----- Please clarify details of where to send clearance request. Thank you -EM

## 2024-02-22 NOTE — Telephone Encounter (Signed)
 I tried to call the surgeon's office for clarification where to send the notes to.   No answer at the requesting office, the phone just kept ringing.   I then called the pt and she told me that it is Dr. Alberta Kitty with [facility]. I thanked the pt for her help and assured the pt that I will fax Dr. Ulysses notes to Dr. Kitty for the preop clearance.

## 2024-03-17 DIAGNOSIS — H43812 Vitreous degeneration, left eye: Secondary | ICD-10-CM | POA: Diagnosis not present

## 2024-03-17 DIAGNOSIS — H43392 Other vitreous opacities, left eye: Secondary | ICD-10-CM | POA: Diagnosis not present

## 2024-03-17 DIAGNOSIS — H26492 Other secondary cataract, left eye: Secondary | ICD-10-CM | POA: Diagnosis not present

## 2024-03-17 DIAGNOSIS — H35372 Puckering of macula, left eye: Secondary | ICD-10-CM | POA: Diagnosis not present

## 2024-03-24 DIAGNOSIS — N3281 Overactive bladder: Secondary | ICD-10-CM | POA: Diagnosis not present

## 2024-03-29 ENCOUNTER — Ambulatory Visit: Admitting: Internal Medicine

## 2024-04-11 ENCOUNTER — Ambulatory Visit: Admitting: Podiatry

## 2024-06-12 ENCOUNTER — Encounter: Payer: Self-pay | Admitting: *Deleted
